# Patient Record
Sex: Male | Born: 1950 | Race: White | Hispanic: No | Marital: Married | Smoking: Former smoker
Health system: Southern US, Community
[De-identification: ages and names within clinical notes are randomized; demographics above are authoritative.]

## PROBLEM LIST (undated history)

## (undated) DIAGNOSIS — I10 Essential (primary) hypertension: Secondary | ICD-10-CM

## (undated) DIAGNOSIS — N189 Chronic kidney disease, unspecified: Secondary | ICD-10-CM

## (undated) DIAGNOSIS — E119 Type 2 diabetes mellitus without complications: Secondary | ICD-10-CM

## (undated) DIAGNOSIS — B029 Zoster without complications: Secondary | ICD-10-CM

## (undated) DIAGNOSIS — G629 Polyneuropathy, unspecified: Secondary | ICD-10-CM

## (undated) DIAGNOSIS — K219 Gastro-esophageal reflux disease without esophagitis: Secondary | ICD-10-CM

## (undated) HISTORY — PX: APPENDECTOMY: SHX54

## (undated) HISTORY — DX: Essential (primary) hypertension: I10

---

## 1976-04-04 HISTORY — PX: TOE AMPUTATION: SHX809

## 2006-05-14 ENCOUNTER — Emergency Department: Payer: Self-pay | Admitting: Emergency Medicine

## 2006-08-19 ENCOUNTER — Emergency Department: Payer: Self-pay | Admitting: Unknown Physician Specialty

## 2006-10-31 ENCOUNTER — Emergency Department: Payer: Self-pay | Admitting: Emergency Medicine

## 2007-02-16 ENCOUNTER — Other Ambulatory Visit: Payer: Self-pay

## 2007-02-16 ENCOUNTER — Inpatient Hospital Stay: Payer: Self-pay | Admitting: Internal Medicine

## 2007-02-17 ENCOUNTER — Other Ambulatory Visit: Payer: Self-pay

## 2007-02-18 ENCOUNTER — Other Ambulatory Visit: Payer: Self-pay

## 2007-07-14 ENCOUNTER — Other Ambulatory Visit: Payer: Self-pay

## 2007-07-14 ENCOUNTER — Emergency Department: Payer: Self-pay | Admitting: Emergency Medicine

## 2008-02-26 ENCOUNTER — Emergency Department: Payer: Self-pay | Admitting: Emergency Medicine

## 2009-01-16 ENCOUNTER — Emergency Department: Payer: Self-pay | Admitting: Emergency Medicine

## 2009-04-17 ENCOUNTER — Emergency Department: Payer: Self-pay | Admitting: Emergency Medicine

## 2009-04-21 ENCOUNTER — Emergency Department: Payer: Self-pay | Admitting: Internal Medicine

## 2009-05-12 ENCOUNTER — Ambulatory Visit: Payer: Self-pay | Admitting: Family Medicine

## 2010-02-23 ENCOUNTER — Emergency Department: Payer: Self-pay | Admitting: Emergency Medicine

## 2010-04-04 DIAGNOSIS — B029 Zoster without complications: Secondary | ICD-10-CM

## 2010-04-04 HISTORY — DX: Zoster without complications: B02.9

## 2011-03-17 ENCOUNTER — Other Ambulatory Visit (HOSPITAL_COMMUNITY): Payer: Self-pay | Admitting: Orthopedic Surgery

## 2011-03-18 ENCOUNTER — Encounter (HOSPITAL_COMMUNITY): Payer: Self-pay | Admitting: Pharmacy Technician

## 2011-03-22 ENCOUNTER — Other Ambulatory Visit: Payer: Self-pay

## 2011-03-22 ENCOUNTER — Encounter (HOSPITAL_COMMUNITY): Payer: Self-pay

## 2011-03-22 ENCOUNTER — Encounter (HOSPITAL_COMMUNITY)
Admission: RE | Admit: 2011-03-22 | Discharge: 2011-03-22 | Disposition: A | Discharge status: Home / Self Care | Payer: BC Managed Care – PPO | Source: Ambulatory Visit | Attending: Orthopedic Surgery | Admitting: Orthopedic Surgery

## 2011-03-22 HISTORY — DX: Zoster without complications: B02.9

## 2011-03-22 HISTORY — DX: Gastro-esophageal reflux disease without esophagitis: K21.9

## 2011-03-22 HISTORY — DX: Chronic kidney disease, unspecified: N18.9

## 2011-03-22 LAB — CBC
MCH: 32 pg (ref 26.0–34.0)
MCV: 88.6 fL (ref 78.0–100.0)
Platelets: 150 10*3/uL (ref 150–400)
RDW: 12.4 % (ref 11.5–15.5)
WBC: 4.8 10*3/uL (ref 4.0–10.5)

## 2011-03-22 LAB — COMPREHENSIVE METABOLIC PANEL
ALT: 59 U/L — ABNORMAL HIGH (ref 0–53)
AST: 33 U/L (ref 0–37)
Albumin: 3.6 g/dL (ref 3.5–5.2)
Calcium: 9.1 mg/dL (ref 8.4–10.5)
Chloride: 105 mEq/L (ref 96–112)
Creatinine, Ser: 0.91 mg/dL (ref 0.50–1.35)
Sodium: 140 mEq/L (ref 135–145)

## 2011-03-22 LAB — TYPE AND SCREEN
ABO/RH(D): O POS
Antibody Screen: NEGATIVE

## 2011-03-22 LAB — PROTIME-INR: INR: 0.9 (ref 0.00–1.49)

## 2011-03-22 LAB — SURGICAL PCR SCREEN: MRSA, PCR: NEGATIVE

## 2011-03-22 NOTE — Pre-Procedure Instructions (Signed)
20 Jared Castillo  03/22/2011   Your procedure is scheduled on:  03/30/11  Report to [facility] at 630 AM.  Call this number if you have problems the morning of surgery: [phone]   Remember:   Do not eat food:After Midnight.  May have clear liquids: up to 4 Hours before arrival.  Clear liquids include soda, tea, black coffee, apple or grape juice, broth.  Take these medicines the morning of surgery with A SIP OF WATER: metoprolol, prilosec, pain med   Do not wear jewelry, make-up or nail polish.  Do not wear lotions, powders, or perfumes. You may wear deodorant.  Do not shave 48 hours prior to surgery.  Do not bring valuables to the hospital.  Contacts, dentures or bridgework may not be worn into surgery.  Leave suitcase in the car. After surgery it may be brought to your room.  For patients admitted to the hospital, checkout time is 11:00 AM the day of discharge.   Patients discharged the day of surgery will not be allowed to drive home.  Name and phone number of your driver: [name] [phone]  Special Instructions: CHG Shower Use Special Wash: 1/2 bottle night before surgery and 1/2 bottle morning of surgery.   Please read over the following fact sheets that you were given: Pain Booklet, Coughing and Deep Breathing, Blood Transfusion Information, MRSA Information and Surgical Site Infection Prevention

## 2011-03-22 NOTE — Progress Notes (Signed)
Patient dx 03/21/11 with shingles -rash on left side. Dr duda office notified. Spoke with autumn.

## 2011-03-24 ENCOUNTER — Other Ambulatory Visit (HOSPITAL_COMMUNITY): Payer: BC Managed Care – PPO

## 2011-03-29 MED ORDER — CEFAZOLIN SODIUM-DEXTROSE 2-3 GM-% IV SOLR
2.0000 g | INTRAVENOUS | Status: AC
  Administered 2011-03-30: 2 g via INTRAVENOUS
  Filled 2011-03-29: qty 50

## 2011-03-30 ENCOUNTER — Ambulatory Visit (HOSPITAL_COMMUNITY): Payer: BC Managed Care – PPO

## 2011-03-30 ENCOUNTER — Encounter (HOSPITAL_COMMUNITY): Payer: Self-pay | Admitting: *Deleted

## 2011-03-30 ENCOUNTER — Encounter (HOSPITAL_COMMUNITY)
Admission: RE | Disposition: A | Discharge status: Home Health | Payer: Self-pay | Source: Ambulatory Visit | Attending: Orthopedic Surgery

## 2011-03-30 ENCOUNTER — Encounter (HOSPITAL_COMMUNITY): Payer: Self-pay | Admitting: Anesthesiology

## 2011-03-30 ENCOUNTER — Ambulatory Visit (HOSPITAL_COMMUNITY): Payer: BC Managed Care – PPO | Admitting: Anesthesiology

## 2011-03-30 ENCOUNTER — Inpatient Hospital Stay (HOSPITAL_COMMUNITY)
Admission: RE | Admit: 2011-03-30 | Discharge: 2011-04-02 | DRG: 209 | Disposition: A | Discharge status: Home Health | Payer: BC Managed Care – PPO | Source: Ambulatory Visit | Attending: Orthopedic Surgery | Admitting: Orthopedic Surgery

## 2011-03-30 DIAGNOSIS — I10 Essential (primary) hypertension: Secondary | ICD-10-CM | POA: Diagnosis present

## 2011-03-30 DIAGNOSIS — M1710 Unilateral primary osteoarthritis, unspecified knee: Principal | ICD-10-CM | POA: Diagnosis present

## 2011-03-30 DIAGNOSIS — M1711 Unilateral primary osteoarthritis, right knee: Secondary | ICD-10-CM

## 2011-03-30 DIAGNOSIS — Z6831 Body mass index (BMI) 31.0-31.9, adult: Secondary | ICD-10-CM

## 2011-03-30 DIAGNOSIS — E669 Obesity, unspecified: Secondary | ICD-10-CM | POA: Diagnosis present

## 2011-03-30 DIAGNOSIS — K219 Gastro-esophageal reflux disease without esophagitis: Secondary | ICD-10-CM | POA: Diagnosis present

## 2011-03-30 HISTORY — PX: TOTAL KNEE ARTHROPLASTY: SHX125

## 2011-03-30 SURGERY — ARTHROPLASTY, KNEE, TOTAL
Anesthesia: General | Site: Knee | Laterality: Right | Wound class: Clean

## 2011-03-30 MED ORDER — MEPERIDINE HCL 25 MG/ML IJ SOLN: 6.2500 mg | INTRAMUSCULAR | Status: DC | PRN

## 2011-03-30 MED ORDER — CEFAZOLIN SODIUM 1-5 GM-% IV SOLN
1.0000 g | Freq: Four times a day (QID) | INTRAVENOUS | Status: AC
  Administered 2011-03-30 – 2011-03-31 (×3): 1 g via INTRAVENOUS
  Filled 2011-03-30 (×3): qty 50

## 2011-03-30 MED ORDER — METHOCARBAMOL 500 MG PO TABS
500.0000 mg | ORAL_TABLET | Freq: Four times a day (QID) | ORAL | Status: DC | PRN
  Administered 2011-03-30 – 2011-03-31 (×5): 500 mg via ORAL
  Filled 2011-03-30 (×4): qty 1

## 2011-03-30 MED ORDER — ONDANSETRON HCL 4 MG PO TABS: 4.0000 mg | ORAL_TABLET | Freq: Four times a day (QID) | ORAL | Status: DC | PRN

## 2011-03-30 MED ORDER — DEXTROSE 5 % IV SOLN: 500.0000 mg | Freq: Four times a day (QID) | INTRAVENOUS | Status: DC | PRN

## 2011-03-30 MED ORDER — METOCLOPRAMIDE HCL 5 MG/ML IJ SOLN
5.0000 mg | Freq: Three times a day (TID) | INTRAMUSCULAR | Status: DC | PRN
  Filled 2011-03-30: qty 2

## 2011-03-30 MED ORDER — WARFARIN SODIUM 7.5 MG PO TABS
7.5000 mg | ORAL_TABLET | Freq: Once | ORAL | Status: AC
  Administered 2011-03-30: 7.5 mg via ORAL
  Filled 2011-03-30: qty 1

## 2011-03-30 MED ORDER — HYDROCODONE-ACETAMINOPHEN 5-325 MG PO TABS
1.0000 | ORAL_TABLET | ORAL | Status: DC | PRN
  Administered 2011-03-30 – 2011-04-02 (×7): 2 via ORAL
  Filled 2011-03-30 (×6): qty 2

## 2011-03-30 MED ORDER — WARFARIN SODIUM 1 MG PO TABS: 1.0000 mg | ORAL_TABLET | Freq: Every day | ORAL | Status: DC

## 2011-03-30 MED ORDER — PHENOL 1.4 % MT LIQD
1.0000 | OROMUCOSAL | Status: DC | PRN
  Filled 2011-03-30: qty 177

## 2011-03-30 MED ORDER — FLEET ENEMA 7-19 GM/118ML RE ENEM: 1.0000 | ENEMA | Freq: Once | RECTAL | Status: AC | PRN

## 2011-03-30 MED ORDER — DOCUSATE SODIUM 100 MG PO CAPS
100.0000 mg | ORAL_CAPSULE | Freq: Two times a day (BID) | ORAL | Status: DC
  Administered 2011-03-30 – 2011-04-02 (×7): 100 mg via ORAL
  Filled 2011-03-30 (×7): qty 1

## 2011-03-30 MED ORDER — METOCLOPRAMIDE HCL 10 MG PO TABS: 5.0000 mg | ORAL_TABLET | Freq: Three times a day (TID) | ORAL | Status: DC | PRN

## 2011-03-30 MED ORDER — ACETAMINOPHEN 650 MG RE SUPP: 650.0000 mg | Freq: Four times a day (QID) | RECTAL | Status: DC | PRN

## 2011-03-30 MED ORDER — OXYCODONE-ACETAMINOPHEN 5-325 MG PO TABS
1.0000 | ORAL_TABLET | ORAL | Status: DC | PRN
  Administered 2011-03-31 – 2011-04-02 (×8): 2 via ORAL
  Filled 2011-03-30 (×8): qty 2

## 2011-03-30 MED ORDER — MIDAZOLAM HCL 5 MG/5ML IJ SOLN
INTRAMUSCULAR | Status: DC | PRN
  Administered 2011-03-30: 2 mg via INTRAVENOUS

## 2011-03-30 MED ORDER — PROMETHAZINE HCL 25 MG/ML IJ SOLN: 6.2500 mg | INTRAMUSCULAR | Status: DC | PRN

## 2011-03-30 MED ORDER — ACETAMINOPHEN 10 MG/ML IV SOLN
INTRAVENOUS | Status: DC | PRN
  Administered 2011-03-30: 1000 mg via INTRAVENOUS

## 2011-03-30 MED ORDER — ZOLPIDEM TARTRATE 10 MG PO TABS: 10.0000 mg | ORAL_TABLET | Freq: Every evening | ORAL | Status: DC | PRN

## 2011-03-30 MED ORDER — BUPIVACAINE-EPINEPHRINE PF 0.5-1:200000 % IJ SOLN
INTRAMUSCULAR | Status: DC | PRN
  Administered 2011-03-30: 30 mL

## 2011-03-30 MED ORDER — PANTOPRAZOLE SODIUM 40 MG PO TBEC
40.0000 mg | DELAYED_RELEASE_TABLET | Freq: Every day | ORAL | Status: DC
  Administered 2011-03-30 – 2011-04-01 (×3): 40 mg via ORAL
  Filled 2011-03-30 (×3): qty 1

## 2011-03-30 MED ORDER — ACETAMINOPHEN 325 MG PO TABS: 650.0000 mg | ORAL_TABLET | Freq: Four times a day (QID) | ORAL | Status: DC | PRN

## 2011-03-30 MED ORDER — FERROUS SULFATE 325 (65 FE) MG PO TABS
325.0000 mg | ORAL_TABLET | Freq: Three times a day (TID) | ORAL | Status: DC
  Administered 2011-03-30 – 2011-04-02 (×9): 325 mg via ORAL
  Filled 2011-03-30 (×12): qty 1

## 2011-03-30 MED ORDER — DIPHENHYDRAMINE HCL 12.5 MG/5ML PO ELIX
12.5000 mg | ORAL_SOLUTION | ORAL | Status: DC | PRN
  Filled 2011-03-30: qty 10

## 2011-03-30 MED ORDER — PROPOFOL 10 MG/ML IV EMUL
INTRAVENOUS | Status: DC | PRN
  Administered 2011-03-30: 200 mg via INTRAVENOUS

## 2011-03-30 MED ORDER — LACTATED RINGERS IV SOLN
INTRAVENOUS | Status: DC | PRN
  Administered 2011-03-30 (×2): via INTRAVENOUS

## 2011-03-30 MED ORDER — OXYCODONE-ACETAMINOPHEN 5-325 MG PO TABS: 1.0000 | ORAL_TABLET | ORAL | Status: AC | PRN

## 2011-03-30 MED ORDER — HYDROMORPHONE HCL PF 1 MG/ML IJ SOLN
0.2500 mg | INTRAMUSCULAR | Status: DC | PRN
  Administered 2011-03-30 (×2): 0.5 mg via INTRAVENOUS

## 2011-03-30 MED ORDER — METOPROLOL TARTRATE 25 MG PO TABS
25.0000 mg | ORAL_TABLET | Freq: Two times a day (BID) | ORAL | Status: DC
  Administered 2011-03-30 – 2011-04-02 (×6): 25 mg via ORAL
  Filled 2011-03-30 (×8): qty 1

## 2011-03-30 MED ORDER — ONDANSETRON HCL 4 MG/2ML IJ SOLN
INTRAMUSCULAR | Status: DC | PRN
  Administered 2011-03-30: 4 mg via INTRAVENOUS

## 2011-03-30 MED ORDER — COUMADIN BOOK
Freq: Once | Status: DC
  Filled 2011-03-30: qty 1

## 2011-03-30 MED ORDER — ONDANSETRON HCL 4 MG/2ML IJ SOLN: 4.0000 mg | Freq: Four times a day (QID) | INTRAMUSCULAR | Status: DC | PRN

## 2011-03-30 MED ORDER — NEOSTIGMINE METHYLSULFATE 1 MG/ML IJ SOLN
INTRAMUSCULAR | Status: DC | PRN
  Administered 2011-03-30: 4 mg via INTRAVENOUS

## 2011-03-30 MED ORDER — SODIUM CHLORIDE 0.9 % IR SOLN
Status: DC | PRN
  Administered 2011-03-30: 3000 mL

## 2011-03-30 MED ORDER — FENTANYL CITRATE 0.05 MG/ML IJ SOLN
INTRAMUSCULAR | Status: DC | PRN
  Administered 2011-03-30 (×2): 100 ug via INTRAVENOUS
  Administered 2011-03-30 (×2): 50 ug via INTRAVENOUS

## 2011-03-30 MED ORDER — HYDROCODONE-ACETAMINOPHEN 5-500 MG PO TABS: 1.0000 | ORAL_TABLET | Freq: Four times a day (QID) | ORAL | Status: AC | PRN

## 2011-03-30 MED ORDER — BISACODYL 5 MG PO TBEC: 5.0000 mg | DELAYED_RELEASE_TABLET | Freq: Every day | ORAL | Status: DC | PRN

## 2011-03-30 MED ORDER — SODIUM CHLORIDE 0.9 % IV SOLN
INTRAVENOUS | Status: DC
  Administered 2011-03-30: 1000 mL via INTRAVENOUS

## 2011-03-30 MED ORDER — HYDROMORPHONE HCL PF 1 MG/ML IJ SOLN
0.5000 mg | INTRAMUSCULAR | Status: DC | PRN
  Administered 2011-03-30 – 2011-04-01 (×10): 1 mg via INTRAVENOUS
  Filled 2011-03-30 (×11): qty 1

## 2011-03-30 MED ORDER — GLYCOPYRROLATE 0.2 MG/ML IJ SOLN
INTRAMUSCULAR | Status: DC | PRN
  Administered 2011-03-30: .8 mg via INTRAVENOUS

## 2011-03-30 MED ORDER — ROCURONIUM BROMIDE 100 MG/10ML IV SOLN
INTRAVENOUS | Status: DC | PRN
  Administered 2011-03-30: 50 mg via INTRAVENOUS

## 2011-03-30 MED ORDER — LIDOCAINE HCL (CARDIAC) 20 MG/ML IV SOLN
INTRAVENOUS | Status: DC | PRN
  Administered 2011-03-30: 50 mg via INTRAVENOUS

## 2011-03-30 MED ORDER — WARFARIN VIDEO: Freq: Once | Status: DC

## 2011-03-30 MED ORDER — MENTHOL 3 MG MT LOZG: 1.0000 | LOZENGE | OROMUCOSAL | Status: DC | PRN

## 2011-03-30 SURGICAL SUPPLY — 52 items
BANDAGE COBAN STERILE 6 (GAUZE/BANDAGES/DRESSINGS) ×2 IMPLANT
BLADE KNIFE  20 PERSONNA (BLADE) ×2
BLADE KNIFE 20 PERSONNA (BLADE) ×2 IMPLANT
BLADE SAW SAG 90X13X1.27 (BLADE) ×2 IMPLANT
BLADE SAW SGTL 13.0X1.19X90.0M (BLADE) ×2 IMPLANT
BNDG COHESIVE 6X5 TAN STRL LF (GAUZE/BANDAGES/DRESSINGS) ×4 IMPLANT
BONE CEMENT PALACOSE (Orthopedic Implant) ×2 IMPLANT
BOWL SMART MIX CTS (DISPOSABLE) IMPLANT
CEMENT BONE PALACOSE (Orthopedic Implant) ×1 IMPLANT
CLOTH BEACON ORANGE TIMEOUT ST (SAFETY) ×2 IMPLANT
COVER BACK TABLE 24X17X13 BIG (DRAPES) IMPLANT
COVER SURGICAL LIGHT HANDLE (MISCELLANEOUS) ×2 IMPLANT
CUFF TOURNIQUET SINGLE 34IN LL (TOURNIQUET CUFF) IMPLANT
CUFF TOURNIQUET SINGLE 44IN (TOURNIQUET CUFF) IMPLANT
DRAPE EXTREMITY T 121X128X90 (DRAPE) ×2 IMPLANT
DRAPE PROXIMA HALF (DRAPES) ×2 IMPLANT
DRAPE U-SHAPE 47X51 STRL (DRAPES) ×2 IMPLANT
DRSG ADAPTIC 3X8 NADH LF (GAUZE/BANDAGES/DRESSINGS) ×2 IMPLANT
DRSG PAD ABDOMINAL 8X10 ST (GAUZE/BANDAGES/DRESSINGS) ×2 IMPLANT
DURAPREP 26ML APPLICATOR (WOUND CARE) ×2 IMPLANT
ELECT REM PT RETURN 9FT ADLT (ELECTROSURGICAL) ×2
ELECTRODE REM PT RTRN 9FT ADLT (ELECTROSURGICAL) ×1 IMPLANT
FACESHIELD LNG OPTICON STERILE (SAFETY) ×2 IMPLANT
GLOVE BIOGEL PI IND STRL 9 (GLOVE) ×1 IMPLANT
GLOVE BIOGEL PI INDICATOR 9 (GLOVE) ×1
GLOVE SURG ORTHO 9.0 STRL STRW (GLOVE) ×2 IMPLANT
GOWN PREVENTION PLUS XLARGE (GOWN DISPOSABLE) ×2 IMPLANT
GOWN SRG XL XLNG 56XLVL 4 (GOWN DISPOSABLE) ×2 IMPLANT
GOWN STRL NON-REIN XL XLG LVL4 (GOWN DISPOSABLE) ×2
HANDPIECE INTERPULSE COAX TIP (DISPOSABLE)
KIT BASIN OR (CUSTOM PROCEDURE TRAY) ×2 IMPLANT
KIT ROOM TURNOVER OR (KITS) ×2 IMPLANT
MANIFOLD NEPTUNE II (INSTRUMENTS) ×2 IMPLANT
NEEDLE SPNL 18GX3.5 QUINCKE PK (NEEDLE) ×2 IMPLANT
NS IRRIG 1000ML POUR BTL (IV SOLUTION) ×2 IMPLANT
PACK TOTAL JOINT (CUSTOM PROCEDURE TRAY) ×2 IMPLANT
PAD ARMBOARD 7.5X6 YLW CONV (MISCELLANEOUS) ×4 IMPLANT
PADDING CAST COTTON 6X4 STRL (CAST SUPPLIES) ×2 IMPLANT
SET HNDPC FAN SPRY TIP SCT (DISPOSABLE) IMPLANT
SPONGE GAUZE 4X4 12PLY (GAUZE/BANDAGES/DRESSINGS) ×2 IMPLANT
STAPLER VISISTAT 35W (STAPLE) ×2 IMPLANT
SUCTION FRAZIER TIP 10 FR DISP (SUCTIONS) IMPLANT
SUT VIC AB 0 CTB1 27 (SUTURE) IMPLANT
SUT VIC AB 1 CTX 36 (SUTURE)
SUT VIC AB 1 CTX36XBRD ANBCTR (SUTURE) IMPLANT
SUT VIC AB 2-0 CTB1 (SUTURE) IMPLANT
SYR 50ML SLIP (SYRINGE) ×2 IMPLANT
TOWEL OR 17X24 6PK STRL BLUE (TOWEL DISPOSABLE) ×2 IMPLANT
TOWEL OR 17X26 10 PK STRL BLUE (TOWEL DISPOSABLE) ×2 IMPLANT
TRAY FOLEY CATH 14FR (SET/KITS/TRAYS/PACK) IMPLANT
WATER STERILE IRR 1000ML POUR (IV SOLUTION) ×6 IMPLANT
WRAP KNEE MAXI GEL POST OP (GAUZE/BANDAGES/DRESSINGS) ×2 IMPLANT

## 2011-03-30 NOTE — H&P (Signed)
Jared Castillo is an 60 y.o. male.   Chief Complaint: Right knee pain HPI: Patient is a 60 year old gentleman who has failed prolonged conservative treatment for osteoarthritis of his right knee. Patient has undergone conservative measures including injections anti-inflammatories activity modification assistive devices all without relief of his right knee pain. Complains of pain with activities of daily living.  Past Medical History  Diagnosis Date  . Shingles rash 13    dx yesterday 03/21/11  . Chronic kidney disease     stones  . GERD (gastroesophageal reflux disease)     Past Surgical History  Procedure Date  . Appendectomy     child  . 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 Social History:  reports that he has quit smoking. He does not have any smokeless tobacco history on file. He reports that he does not use illicit drugs. His alcohol history not on file.  Allergies: No Known Allergies  Medications Prior to Admission  Medication Dose Route Frequency Provider Last Rate Last Dose  . ceFAZolin (ANCEF) IVPB 2 g/50 mL premix  2 g Intravenous 60 min Pre-Op Christella Hartigan, PHARMD       No current outpatient prescriptions on file as of .    No results found for this or any previous visit (from the past 48 hour(s)). No results found.  Review of Systems  All other systems reviewed and are negative.    There were no vitals taken for this visit. Physical Exam on examination patient has range of motion from 10-100. There is crepitation with range of motion of the right knee. Collateral ligaments are stable.  Assessment/Plan Osteoarthritis right knee. Plan for total knee arthroplasty. Risks and benefits were discussed including infection neurovascular injury persistent pain DVT pulmonary embolus need for additional surgery. Patient states he understands and wished to proceed at this time.  Laneice Meneely V 03/30/2011, 6:03 AM

## 2011-03-30 NOTE — Anesthesia Postprocedure Evaluation (Signed)
  Anesthesia Post-op Note  Patient: Jared Castillo  Procedure(s) Performed:  TOTAL KNEE ARTHROPLASTY - Right Total Knee Arthroplastly  Patient Location: PACU  Anesthesia Type: GA combined with regional for post-op pain  Level of Consciousness: awake  Airway and Oxygen Therapy: Patient Spontanous Breathing  Post-op Pain: mild  Post-op Assessment: Post-op Vital signs reviewed  Post-op Vital Signs: stable  Complications: No apparent anesthesia complications

## 2011-03-30 NOTE — Anesthesia Preprocedure Evaluation (Addendum)
Anesthesia Evaluation  Patient identified by MRN, date of birth, ID band Patient awake    Reviewed: Allergy & Precautions, H&P , NPO status , Patient's Chart, lab work & pertinent test results, reviewed documented beta blocker date and time   Airway Mallampati: II  Neck ROM: Full    Dental  (+) Edentulous Upper and Edentulous Lower   Pulmonary  clear to auscultation        Cardiovascular hypertension, Pt. on medications and Pt. on home beta blockers Regular Normal    Neuro/Psych    GI/Hepatic GERD-  Medicated and Controlled,  Endo/Other    Renal/GU Renal disease     Musculoskeletal  (+) Arthritis -,   Abdominal (+) obese,   Peds  Hematology   Anesthesia Other Findings   Reproductive/Obstetrics                          Anesthesia Physical Anesthesia Plan  ASA: II  Anesthesia Plan: General   Post-op Pain Management:    Induction: Intravenous  Airway Management Planned: Oral ETT  Additional Equipment:   Intra-op Plan:   Post-operative Plan: Extubation in OR  Informed Consent: I have reviewed the patients History and Physical, chart, labs and discussed the procedure including the risks, benefits and alternatives for the proposed anesthesia with the patient or authorized representative who has indicated his/her understanding and acceptance.   Dental advisory given  Plan Discussed with: CRNA and Surgeon  Anesthesia Plan Comments:         Anesthesia Quick Evaluation

## 2011-03-30 NOTE — Transfer of Care (Signed)
Immediate Anesthesia Transfer of Care Note  Patient: Jared Castillo  Procedure(s) Performed:  TOTAL KNEE ARTHROPLASTY - Right Total Knee Arthroplastly  Patient Location: PACU  Anesthesia Type: GA combined with regional for post-op pain  Level of Consciousness: awake  Airway & Oxygen Therapy: Patient connected to nasal cannula oxygen  Post-op Assessment: Report given to PACU RN and Post -op Vital signs reviewed and stable  Post vital signs: Reviewed and stable  Complications: No apparent anesthesia complications

## 2011-03-30 NOTE — Anesthesia Procedure Notes (Addendum)
Anesthesia Regional Block:  Femoral nerve block  Pre-Anesthetic Checklist: ,, timeout performed, Correct Patient, Correct Site, Correct Laterality, Correct Procedure, Correct Position, site marked, Risks and benefits discussed, at surgeon's request and post-op pain management  Laterality: Right  Prep: Betadine       Needles:  Injection technique: Single-shot  Needle Type: Stimulator Needle - 80     Needle Length: 5cm 5 cm Needle Gauge: 22 and 22 G  Needle insertion depth: 4 cm   Additional Needles:  Procedures: nerve stimulator Femoral nerve block  Nerve Stimulator or Paresthesia:  Response: Twitch elicited, 0.8 mA, 0.3 ms,   Additional Responses:   Narrative:  Start time: 03/30/2011 7:50 AM End time: 03/30/2011 8:11 AM  Performed by: Personally  Anesthesiologist: Alma Friendly, MD  Additional Notes: BP cuff, EKG monitors applied. Sedation begun. Femoral artery palpated for location of nerve. After nerve location anesthetic injected incrementally, slowly , and after neg aspirations. Tolerated well.  Femoral nerve block Procedure Name: Intubation Date/Time: 03/30/2011 8:33 AM Performed by: Margaree Mackintosh Pre-anesthesia Checklist: Patient identified, Emergency Drugs available, Suction available, Patient being monitored and Timeout performed Patient Re-evaluated:Patient Re-evaluated prior to inductionOxygen Delivery Method: Circle System Utilized Preoxygenation: Pre-oxygenation with 100% oxygen Intubation Type: IV induction Ventilation: Mask ventilation with difficulty and Oral airway inserted - appropriate to patient size    Date/Time: 03/30/2011 8:40 AM Performed by: Margaree Mackintosh Laryngoscope Size: Mac and 3 Grade View: Grade III Tube type: Oral Tube size: 8.0 mm Number of attempts: 1 Airway Equipment and Method: stylet Placement Confirmation: ETT inserted through vocal cords under direct vision,  positive ETCO2 and breath sounds checked- equal and  bilateral Secured at: 22 cm Tube secured with: Tape Dental Injury: Teeth and Oropharynx as per pre-operative assessment  Difficulty Due To: Difficulty was unanticipated and Difficult Airway- due to anterior larynx

## 2011-03-30 NOTE — Op Note (Signed)
OPERATIVE REPORT  DATE OF SURGERY: 03/30/2011  PATIENT:  Alvie Heidelberg,  60 y.o. male  PRE-OPERATIVE DIAGNOSIS:  Right Knee Osteoarthritis  POST-OPERATIVE DIAGNOSIS:  Right Knee Osteoarthritis  PROCEDURE:  Procedure(s): TOTAL KNEE ARTHROPLASTY  SURGEON:  Surgeon(s): Nadara Mustard, MD  ANESTHESIA:   general  EBL:  Minimal ML  SPECIMEN:  No Specimen  TOURNIQUET:   Total Tourniquet Time Documented: Thigh (Right) - 42 minutes  PROCEDURE DETAILS: Right total knee arthroplasty. Zimmer components. Size G. femur. Size 6 tibia. 12 mm poly-tray. 32 mm patella. Indication for procedure. Patient is a 60 year old gentleman with osteoarthritis of his right knee he has failed conservative treatment risks and benefits were discussed and patient was to proceed at this time. Description of procedure patient was brought to OR room 10 and underwent a general anesthetic. After adequate levels of anesthesia were obtained patient's right lower extremity was prepped using DuraPrep and draped into a sterile field with Ioban covering all exposed skin. A midline incision was made carried down to the medial parapatellar retinacular incision 10 mm reamer was used for the IM guide this was set for 5 valgus with 10 mm taken off the distal femur. Attention was then focused to the tibia neutral varus and valgus and 7 posterior slope was used to resect 10 mm off the tibia. The tibia was sized for a size 6 and the punch and keel cut was made for the size 6 tibia. Attention was then focused on the femur the chamfer cuts and box cuts were made for a size G. Trial components were placed the knee was placed through a range of motion had stable varus and valgus stress with a 12 mm poly-tray. The patella was resurfaced 10 mm was taken off the patella and a 32 mm patella punch cuts were made. The knee was then irrigated with pulsatile lavage the components were cemented in place with the loose cement removed. The knee was  left in extension until the cement hardened the knee was left with the patella clamped until the cement hardened. The patella tracked midline the tourniquet tourniquet was deflated and hemostasis was obtained. The retinacular incision was closed using #1 Vicryl. Subcutaneous is closed using 0 Vicryl. The skin was closed using approximate staples. The wound was covered with Adaptic orthopedic sponges ABDs dressing Kerlix and Coban. Patient was extubated taken to the PACU in stable condition.  PLAN OF CARE: Admit to inpatient   PATIENT DISPOSITION:  PACU - hemodynamically stable.   Nadara Mustard, MD 03/30/2011 10:09 AM

## 2011-03-30 NOTE — Progress Notes (Signed)
ANTICOAGULATION CONSULT NOTE - Initial Consult  Pharmacy Consult for Couamdin Indication: VTE prophylaxis  No Known Allergies  Patient Measurements:     Vital Signs: Temp: 97.8 F (36.6 C) (12/26 1117) Temp src: Oral (12/26 0640) BP: 133/71 mmHg (12/26 1117) Pulse Rate: 65  (12/26 1117)  Labs: Baseline INR 0.9; Hgb 15.4; PLTC 150; SCr 0.91 (03/22/11)  Medical History: Past Medical History  Diagnosis Date  . Shingles rash 13    dx yesterday 03/21/11  . Chronic kidney disease     stones  . GERD (gastroesophageal reflux disease)   . Hypertension     Medications:  Scheduled:    . ceFAZolin (ANCEF) IV  1 g Intravenous Q6H  . ceFAZolin (ANCEF) IV  2 g Intravenous 60 min Pre-Op  . docusate sodium  100 mg Oral BID  . ferrous sulfate  325 mg Oral TID PC  . metoprolol tartrate  25 mg Oral BID  . pantoprazole  40 mg Oral Q1200    Assessment: 60 yo M admitted for with osteoarthritis s/p R knee replacement.  To start Coumadin for VTE prophylaxis.  Goal of Therapy:  INR 2-3   Plan:  Coumadin 7.5 mg PO x 1 dose tonight. Daily INR. Coumadin education materials ordered.  [employer], Pharm.D., BCPS Clinical Pharmacist Pager [phone]  03/30/2011,12:22 PM

## 2011-03-30 NOTE — Preoperative (Signed)
Beta Blockers   Reason not to administer Beta Blockers:Not Applicable. Metoprolol @ 0500 03/30/11

## 2011-03-31 ENCOUNTER — Encounter (HOSPITAL_COMMUNITY): Payer: Self-pay | Admitting: Orthopedic Surgery

## 2011-03-31 LAB — BASIC METABOLIC PANEL
BUN: 13 mg/dL (ref 6–23)
Chloride: 99 mEq/L (ref 96–112)
Creatinine, Ser: 0.81 mg/dL (ref 0.50–1.35)
GFR calc Af Amer: >90 mL/min (ref >90)
GFR calc non Af Amer: >90 mL/min (ref >90)
Potassium: 3.9 mEq/L (ref 3.5–5.1)

## 2011-03-31 LAB — CBC
MCHC: 34.4 g/dL (ref 30.0–36.0)
MCV: 89.5 fL (ref 78.0–100.0)
Platelets: 161 10*3/uL (ref 150–400)
RDW: 12.9 % (ref 11.5–15.5)
WBC: 8.8 10*3/uL (ref 4.0–10.5)

## 2011-03-31 LAB — PROTIME-INR: INR: 1.1 (ref 0.00–1.49)

## 2011-03-31 MED ORDER — WARFARIN SODIUM 7.5 MG PO TABS
7.5000 mg | ORAL_TABLET | Freq: Once | ORAL | Status: AC
  Administered 2011-03-31: 7.5 mg via ORAL
  Filled 2011-03-31: qty 1

## 2011-03-31 NOTE — Progress Notes (Signed)
Error JB.

## 2011-03-31 NOTE — Progress Notes (Signed)
Subjective: 1 Day Post-Op Procedure(s) (LRB): TOTAL KNEE ARTHROPLASTY (Right)   Dressing dry  Objective: Vital signs in last 24 hours: Temp:  [97.3 F (36.3 C)-98.6 F (37 C)] 98.6 F (37 C) (12/26 2104) Pulse Rate:  [59-95] 95  (12/26 2104) Resp:  [12-20] 18  (12/26 2104) BP: (121-150)/(71-94) 124/75 mmHg (12/26 2104) SpO2:  [92 %-97 %] 92 % (12/26 2104)  Intake/Output from previous day: 12/26 0701 - 12/27 0700 In: 2146.3 [P.O.:240; I.V.:1906.3] Out: 700 [Urine:700] Intake/Output this shift: Total I/O In: 546.3 [P.O.:240; I.V.:306.3] Out: -    Basename 03/31/11 0540  HGB 10.9*    Basename 03/31/11 0540  WBC 8.8  RBC 3.54*  HCT 31.7*  PLT 161   No results found for this basename: NA:2,K:2,CL:2,CO2:2,BUN:2,CREATININE:2,GLUCOSE:2,CALCIUM:2 in the last 72 hours No results found for this basename: LABPT:2,INR:2 in the last 72 hours  Neurologically intact  Assessment/Plan: 1 Day Post-Op Procedure(s) (LRB): TOTAL KNEE ARTHROPLASTY (Right) Up with therapy  Jared Castillo V 03/31/2011, 6:28 AM

## 2011-03-31 NOTE — Progress Notes (Signed)
Physical Therapy Evaluation Patient Details Name: Jared Castillo MRN: [id number] DOB: 29-Sep-1950 Today's Date: 03/31/2011  Problem List: There is no problem list on file for this patient.   Past Medical History:  Past Medical History  Diagnosis Date  . Shingles rash 13    dx yesterday 03/21/11  . Chronic kidney disease     stones  . GERD (gastroesophageal reflux disease)   . Hypertension    Past Surgical History:  Past Surgical History  Procedure Date  . Appendectomy     child  . 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  pt very motivated and should make great progress.   PT Recommendation/Assessment: Patient will need skilled PT in the acute care venue PT Problem List: Decreased strength;Decreased range of motion;Decreased activity tolerance;Decreased balance;Decreased mobility;Decreased knowledge of use of DME;Pain Barriers to Discharge: None PT Therapy Diagnosis : Abnormality of gait;Acute pain PT Plan PT Frequency: 7X/week PT Treatment/Interventions: DME instruction;Gait training;Stair training;Functional mobility training;Therapeutic activities;Therapeutic exercise;Balance training;Patient/family education PT Recommendation Recommendations for Other Services: OT consult Follow Up Recommendations: Home health PT Equipment Recommended: Rolling walker with 5" wheels;3 in 1 bedside comode PT Goals  Acute Rehab PT Goals PT Goal Formulation: With patient Time For Goal Achievement: 7 days Pt will go Supine/Side to Sit: Independently PT Goal: Supine/Side to Sit - Progress: Not met Pt will go Sit to Supine/Side: Independently PT Goal: Sit to Supine/Side - Progress: Not met Pt will go Sit to Stand: with modified independence PT Goal: Sit to Stand - Progress: Not met Pt will Ambulate: >150 feet;with modified independence;with rolling walker PT Goal: Ambulate - Progress:  Not met Pt will Go Up / Down Stairs: 3-5 stairs;with min assist;with least restrictive assistive device PT Goal: Up/Down Stairs - Progress: Not met Pt will Perform Home Exercise Program: Independently PT Goal: Perform Home Exercise Program - Progress: Not met  PT Evaluation Precautions/Restrictions  Precautions Precautions: Knee Required Braces or Orthoses: No Restrictions Weight Bearing Restrictions: Yes RLE Weight Bearing: Weight bearing as tolerated Prior Functioning  Home Living Lives With: Spouse Receives Help From: Family Type of Home: Mobile home Home Layout: One level Home Access: Stairs to enter Entrance Stairs-Rails: Lawyer of Steps: 4 Home Adaptive Equipment: Crutches Prior Function Level of Independence: Independent with basic ADLs;Independent with homemaking with ambulation;Independent with gait;Independent with transfers Able to Take Stairs?: Reciprically Driving: Yes 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   Transfers Transfers: Yes Sit to Stand: 4: Min assist;With upper extremity assist;From bed Sit to Stand Details (indicate cue type and reason): cues for UE use and positioning of LEs.   Stand to Sit: 4: Min assist;With upper extremity assist;With armrests;To chair/3-in-1 Stand to Sit Details: cues for LE positioning and to conrtol  descent Ambulation/Gait Ambulation/Gait: Yes Ambulation/Gait Assistance: 4: Min assist Ambulation/Gait Assistance Details (indicate cue type and reason): cues for  sequencing, upright posture, WBing on R LE Ambulation Distance (Feet): 40 Feet Assistive device: Rolling walker Gait Pattern: Step-to pattern;Decreased stride length;Trunk flexed Stairs: No Wheelchair Mobility Wheelchair Mobility: No    Exercise    End of Session PT - End of Session Equipment Utilized During Treatment: Gait belt Activity Tolerance: Patient limited by pain;Patient limited by fatigue Patient left: in chair;with call bell in reach;with family/visitor present Nurse Communication: Mobility status for transfers;Mobility status for ambulation General Behavior During Session: [facility] for tasks performed Cognition: [facility] for tasks performed  Sunny Schlein, [place] [phone] 03/31/2011, 11:27 AM

## 2011-03-31 NOTE — Progress Notes (Signed)
Case Manager has spoken with patient, choice offered for home Health. Home Health and DME have been arranged thru [facility].

## 2011-03-31 NOTE — Progress Notes (Signed)
ANTICOAGULATION CONSULT NOTE - Follow Up Consult  Pharmacy Consult for Coumadin Indication: VTE prophylaxis s/p R TKA  No Known Allergies  Patient Measurements:    Vital Signs: Temp: 98.2 F (36.8 C) (12/27 0629) BP: 140/85 mmHg (12/27 0629) Pulse Rate: 89  (12/27 0629)  Labs:  Basename 03/31/11 0540  HGB 10.9*  HCT 31.7*  PLT 161  APTT --  LABPROT 14.4  INR 1.10  HEPARINUNFRC --  CREATININE 0.81  CKTOTAL --  CKMB --  TROPONINI --   CrCl is unknown because there is no height on file for the current visit.   Medications:  Scheduled:    . ceFAZolin (ANCEF) IV  1 g Intravenous Q6H  . coumadin book   Does not apply Once  . docusate sodium  100 mg Oral BID  . ferrous sulfate  325 mg Oral TID PC  . metoprolol tartrate  25 mg Oral BID  . pantoprazole  40 mg Oral Q1200  . warfarin  7.5 mg Oral ONCE-1800  . warfarin   Does not apply Once    Assessment: 60 yo M s/p R TKA on Coumadin for post-op VTE prophylaxis.  INR sub therapeutic as expected after first dose of Coumadin.  Goal of Therapy:  INR 2-3   Plan:  Repeat Coumadin 7.5 mg dose tonight. INR in AM.  Dixie Dials, Pharm.D., BCPS Clinical Pharmacist Pager [phone]  03/31/2011,10:54 AM

## 2011-03-31 NOTE — Progress Notes (Signed)
Occupational Therapy Evaluation Patient Details Name: Jared Castillo MRN: [id number] DOB: 06-10-1950 Today's Date: 03/31/2011  Problem List: There is no problem list on file for this patient.   Past Medical History:  Past Medical History  Diagnosis Date  . Shingles rash 13    dx yesterday 03/21/11  . Chronic kidney disease     stones  . GERD (gastroesophageal reflux disease)   . Hypertension    Past Surgical History:  Past Surgical History  Procedure Date  . Appendectomy     child  . 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 Total knee arthroplasty 03/30/2011    Procedure: TOTAL KNEE ARTHROPLASTY;  Surgeon: Nadara Mustard, MD;  Location: [facility] OR;  Service: Orthopedics;  Laterality: Right;  Right Total Knee Arthroplastly    OT Assessment/Plan/Recommendation OT Assessment Clinical Impression Statement: Pt s/p R TKA and demonstrates with decreased activity tolerance and acute pain.  Pt will benefit from acute OT to increase I with ADLs and functional transfers in prep for d/c home with wife. OT Recommendation/Assessment: Patient will need skilled OT in the acute care venue OT Problem List: Decreased activity tolerance;Pain;Decreased knowledge of use of DME or AE OT Therapy Diagnosis : Acute pain OT Plan OT Frequency: Min 2X/week OT Treatment/Interventions: Self-care/ADL training;DME and/or AE instruction;Therapeutic activities;Patient/family education OT Recommendation Follow Up Recommendations: None Equipment Recommended: Rolling walker with 5" wheels;3 in 1 bedside comode Individuals Consulted Consulted and Agree with Results and Recommendations: Patient OT Goals Acute Rehab OT Goals OT Goal Formulation: With patient Time For Goal Achievement: 7 days ADL Goals Pt Will Perform Grooming: with modified independence;Standing at sink;Unsupported ADL Goal: Grooming - Progress: Not met Pt Will Perform Lower Body Bathing: with modified independence;Sit to stand from bed;Sit to  stand from chair;Unsupported ADL Goal: Lower Body Bathing - Progress: Not met Pt Will Perform Lower Body Dressing: with modified independence;Sit to stand from chair;Sit to stand from bed;Unsupported ADL Goal: Lower Body Dressing - Progress: Not met Pt Will Transfer to Toilet: with modified independence;Ambulation;with DME;3-in-1 ADL Goal: Toilet Transfer - Progress: Not met Miscellaneous OT Goals Miscellaneous OT Goal #1: Pt will perform bed mobility with mod I HOB flat in prep for EOB ADLs. OT Goal: Miscellaneous Goal #1 - Progress: Not met  OT Evaluation Precautions/Restrictions  Precautions Precautions: Knee Required Braces or Orthoses: No Restrictions Weight Bearing Restrictions: Yes RLE Weight Bearing: Weight bearing as tolerated Prior Functioning Home Living Lives With: Spouse Receives Help From: Family Type of Home: Mobile home Home Layout: One level Home Access: Stairs to enter Entrance Stairs-Rails: Lawyer of Steps: 4 Bathroom Shower/Tub: Tub/shower unit;Walk-in shower Bathroom Toilet: Standard Home Adaptive Equipment: Crutches Prior Function Level of Independence: Independent with basic ADLs;Independent with homemaking with ambulation;Independent with gait;Independent with transfers Able to Take Stairs?: Reciprically Driving: Yes 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  ) ADL ADL Eating/Feeding: Performed;Independent Where Assessed - Eating/Feeding: Chair Grooming: Simulated;Set up Where Assessed - Grooming: Sitting, chair;Unsupported Upper Body Bathing: Simulated;Set up Where Assessed - Upper Body Bathing: Sitting, bed;Unsupported Lower Body Bathing: Simulated;Minimal assistance Where Assessed - Lower Body Bathing: Unsupported;Sit to stand from bed Upper Body Dressing: Set up;Performed Where Assessed - Upper Body Dressing: Sitting, bed;Unsupported Lower Body Dressing: Simulated;Minimal assistance Where Assessed -  Lower Body Dressing: Sit to stand from bed;Supported Toilet Transfer: Simulated;Minimal assistance Toilet Transfer Details (indicate cue type and reason): Pt ambulated through room from bed to chair Toilet Transfer Method: Ambulating Toileting - Clothing Manipulation:  Simulated;Minimal assistance Toileting - Clothing Manipulation Details (indicate cue type and reason): min A for steadying and balance Where Assessed - Toileting Clothing Manipulation: Standing Equipment Used: Rolling walker Vision/Perception    Cognition Cognition Arousal/Alertness: Awake/alert Overall Cognitive Status: Appears within functional limits for tasks assessed Orientation Level: Oriented X4 Sensation/Coordination   Extremity Assessment RUE Assessment RUE Assessment: Within Functional Limits LUE Assessment LUE Assessment: Within Functional Limits Mobility  Bed Mobility Bed Mobility: Yes Supine to Sit: 4: Min assist Supine to Sit Details (indicate cue type and reason): A with R LE only Sitting - Scoot to Edge of Bed: 4: Min assist Sitting - Scoot to [place] of Bed Details (indicate cue type and reason): A to support R LE only.   Transfers Transfers: Yes Sit to Stand: 4: Min assist;With upper extremity assist;From bed Sit to Stand Details (indicate cue type and reason): cues for UE use and positioning of LEs.   Stand to Sit: 4: Min assist;With upper extremity assist;With armrests;To chair/3-in-1 Stand to Sit Details: cues for LE positioning and to conrtol descent Exercises   End of Session OT - End of Session Equipment Utilized During Treatment: Gait belt Activity Tolerance: Patient limited by fatigue Patient left: in chair;with call bell in reach;with family/visitor present Nurse Communication: Mobility status for transfers General Behavior During Session: [facility] for tasks performed Cognition: [facility] for tasks performed   Cipriano Mile 03/31/2011, 11:55 AM  03/31/2011 Cipriano Mile OTR/L Pager [phone] Office [phone]

## 2011-04-01 DIAGNOSIS — M1711 Unilateral primary osteoarthritis, right knee: Secondary | ICD-10-CM | POA: Diagnosis present

## 2011-04-01 LAB — PROTIME-INR: Prothrombin Time: 18 seconds — ABNORMAL HIGH (ref 11.6–15.2)

## 2011-04-01 LAB — CBC
MCH: 31.1 pg (ref 26.0–34.0)
MCV: 88.8 fL (ref 78.0–100.0)
Platelets: 128 10*3/uL — ABNORMAL LOW (ref 150–400)
RBC: 3.12 MIL/uL — ABNORMAL LOW (ref 4.22–5.81)
RDW: 12.9 % (ref 11.5–15.5)

## 2011-04-01 MED ORDER — WARFARIN SODIUM 6 MG PO TABS
6.0000 mg | ORAL_TABLET | Freq: Once | ORAL | Status: AC
  Administered 2011-04-01: 6 mg via ORAL
  Filled 2011-04-01: qty 1

## 2011-04-01 MED ORDER — TAMSULOSIN HCL 0.4 MG PO CAPS
0.4000 mg | ORAL_CAPSULE | Freq: Every day | ORAL | Status: DC
  Administered 2011-04-01 – 2011-04-02 (×2): 0.4 mg via ORAL
  Filled 2011-04-01 (×2): qty 1

## 2011-04-01 NOTE — Progress Notes (Signed)
Utilization review completed. Latwan Luchsinger, RN, BSN. 04/01/11  

## 2011-04-01 NOTE — Progress Notes (Signed)
Subjective: 2 Days Post-Op Procedure(s) (LRB): TOTAL KNEE ARTHROPLASTY (Right) Patient reports pain as mild and well controlled with po meds.   Ambulated in hallway with PT and doing active ROM exercises Objective: Vital signs in last 24 hours: Temp:  [96.9 F (36.1 C)-98.9 F (37.2 C)] 98.6 F (37 C) (12/28 0623) Pulse Rate:  [88-96] 90  (12/28 0623) Resp:  [18] 18  (12/28 0623) BP: (137-153)/(74-93) 137/74 mmHg (12/28 0623) SpO2:  [93 %-97 %] 97 % (12/28 0623)  Intake/Output from previous day: 12/27 0701 - 12/28 0700 In: 360 [P.O.:360] Out: 1450 [Urine:1450] Intake/Output this shift:     Basename 04/01/11 0532 03/31/11 0540  HGB 9.7* 10.9*    Basename 04/01/11 0532 03/31/11 0540  WBC 8.5 8.8  RBC 3.12* 3.54*  HCT 27.7* 31.7*  PLT 128* 161    Basename 03/31/11 0540  NA 135  K 3.9  CL 99  CO2 27  BUN 13  CREATININE 0.81  GLUCOSE 146*  CALCIUM 8.2*    Basename 04/01/11 0532 03/31/11 0540  LABPT -- --  INR 1.46 1.10    Neurovascular intact Dorsiflexion/Plantar flexion intact Incision: no drainage  Assessment/Plan: 2 Days Post-Op Procedure(s) (LRB): TOTAL KNEE ARTHROPLASTY (Right) Up with therapy D/C IV fluids Plan for discharge tomorrow if continues to do well with PT Will dc foley around noon today for a voiding trial. Start flomax now  Jared Castillo M 04/01/2011, 10:30 AM

## 2011-04-01 NOTE — Progress Notes (Signed)
ANTICOAGULATION CONSULT NOTE - Follow Up Consult  Pharmacy Consult for Coumadin Indication: VTE prophylaxis s/p TKA  No Known Allergies  Patient Measurements:   Adjusted Body Weight:   Vital Signs: Temp: 98.6 F (37 C) (12/28 0623) Temp src: Oral (12/28 0623) BP: 137/74 mmHg (12/28 0623) Pulse Rate: 90  (12/28 0623)  Labs:  Basename 04/01/11 0532 03/31/11 0540  HGB 9.7* 10.9*  HCT 27.7* 31.7*  PLT 128* 161  APTT -- --  LABPROT 18.0* 14.4  INR 1.46 1.10  HEPARINUNFRC -- --  CREATININE -- 0.81  CKTOTAL -- --  CKMB -- --  TROPONINI -- --   CrCl is unknown because there is no height on file for the current visit.   Assessment: 60yom on Coumadin for VTE prophylaxis s/p TKA. INR (1.46) is subtherapeutic but trending up. INR increase slightly greater than desired, will decrease dose to avoid large INR jump.  - H/H and Plts decreased - No significant bleeding reported  Goal of Therapy:  INR 2-3   Plan:  1. Coumadin 6mg  po x 1 today 2. Follow-up AM INR and CBC  Cleon Dew [phone] 04/01/2011,8:52 AM

## 2011-04-01 NOTE — Progress Notes (Signed)
Physical Therapy Treatment Patient Details Name: Jared Castillo MRN: [id number] DOB: [birth year] Today's Date: 04/01/2011  PT Assessment/Plan  PT - Assessment/Plan Comments on Treatment Session: Pt. progressing well with ambulation. Will attempt stairs next session PT Plan: Discharge plan remains appropriate PT Frequency: 7X/week Follow Up Recommendations: Home health PT Equipment Recommended: Rolling walker with 5" wheels;3 in 1 bedside comode PT Goals  Acute Rehab PT Goals PT Goal: Supine/Side to Sit - Progress: Progressing toward goal PT Goal: Sit to Stand - Progress: Met PT Goal: Ambulate - Progress: Progressing toward goal PT Goal: Perform Home Exercise Program - Progress: Progressing toward goal  PT Treatment Precautions/Restrictions  Precautions Precautions: Knee Required Braces or Orthoses: No Restrictions Weight Bearing Restrictions: Yes RLE Weight Bearing: Weight bearing as tolerated Mobility (including Balance) Bed Mobility Supine to Sit: 6: Modified independent (Device/Increase time) Sitting - Scoot to Edge of Bed: 6: Modified independent (Device/Increase time) Transfers Sit to Stand: 6: Modified independent (Device/Increase time) Stand to Sit: 6: Modified independent (Device/Increase time) Ambulation/Gait Ambulation/Gait Assistance: 4: Min assist (MinGuard A) Ambulation/Gait Assistance Details (indicate cue type and reason): Cues to heel strike on R LE. Cues for LE positioning.  Ambulation Distance (Feet): 70 Feet Assistive device: Rolling walker Gait Pattern: Step-to pattern;Decreased stance time - right    Exercise  Total Joint Exercises Quad Sets: AROM;15 reps;Right Short Arc Quad: AAROM;Right;10 reps Heel Slides: AAROM;Right;10 reps Hip ABduction/ADduction: AAROM;Right;10 reps Straight Leg Raises: Right;AAROM;10 reps End of Session PT - End of Session Equipment Utilized During Treatment: Gait belt Activity Tolerance: Patient tolerated treatment  well Patient left: in chair;with call bell in reach General Behavior During Session: [facility] for tasks performed Cognition: [facility] for tasks performed  Conan Mcmanaway, Adline Potter 04/01/2011, 1:59 PM 04/01/2011 Fredrich Birks PTA [phone] pager [phone] office

## 2011-04-02 LAB — CBC
MCH: 31 pg (ref 26.0–34.0)
MCHC: 34.6 g/dL (ref 30.0–36.0)
MCV: 89.5 fL (ref 78.0–100.0)
Platelets: 132 10*3/uL — ABNORMAL LOW (ref 150–400)
RBC: 2.94 MIL/uL — ABNORMAL LOW (ref 4.22–5.81)
RDW: 13.1 % (ref 11.5–15.5)

## 2011-04-02 LAB — PROTIME-INR: Prothrombin Time: 17.9 seconds — ABNORMAL HIGH (ref 11.6–15.2)

## 2011-04-02 NOTE — Progress Notes (Signed)
Physical Therapy Treatment Patient Details Name: Jared Castillo MRN: [id number] DOB: 05/06/1950 Today's Date: 04/02/2011  PT Assessment/Plan  PT - Assessment/Plan Comments on Treatment Session: Pt and wife reviewed HEP. Pt progressing well with ambulation and stairs PT Plan: Discharge plan remains appropriate PT Frequency: 7X/week Follow Up Recommendations: Home health PT Equipment Recommended: Rolling walker with 5" wheels;3 in 1 bedside comode PT Goals  Acute Rehab PT Goals PT Goal: Sit to Stand - Progress: Progressing toward goal PT Goal: Ambulate - Progress: Progressing toward goal PT Goal: Up/Down Stairs - Progress: Progressing toward goal PT Goal: Perform Home Exercise Program - Progress: Progressing toward goal  PT Treatment Precautions/Restrictions  Precautions Precautions: Knee Required Braces or Orthoses: No Restrictions Weight Bearing Restrictions: Yes RLE Weight Bearing: Weight bearing as tolerated Mobility (including Balance) Transfers Sit to Stand: 6: Modified independent (Device/Increase time) Stand to Sit: 6: Modified independent (Device/Increase time) Ambulation/Gait Ambulation/Gait Assistance: 5: Supervision Ambulation/Gait Assistance Details (indicate cue type and reason): Cues for positioning of RW and cues to heel strike on RLE Ambulation Distance (Feet): 150 Feet Assistive device: Rolling walker Gait Pattern: Step-to pattern;Decreased stance time - right Stairs: Yes Stairs Assistance: 4: Min assist (MinGuard A) Stairs Assistance Details (indicate cue type and reason): cues for sequencing and technique Stair Management Technique: Two rails Number of Stairs: 2  (practiced twice)    Exercise    End of Session PT - End of Session Equipment Utilized During Treatment: Gait belt Activity Tolerance: Patient tolerated treatment well Patient left: in chair;with call bell in reach;with family/visitor present General Behavior During Session: [facility] for tasks  performed Cognition: [facility] for tasks performed  Stace Peace, Adline Potter 04/02/2011, 1:27 PM 04/02/2011 Fredrich Birks PTA [phone] pager [phone] office

## 2011-04-02 NOTE — Progress Notes (Signed)
Subjective: 3 Days Post-Op Procedure(s) (LRB): TOTAL KNEE ARTHROPLASTY (Right)  Patient reports pain as mild.    Objective:   VITALS:  Temp:  [97.9 F (36.6 C)-98.6 F (37 C)] 97.9 F (36.6 C) (12/29 0459) Pulse Rate:  [79-101] 79  (12/29 0459) Resp:  [18] 18  (12/29 0459) BP: (106-146)/(64-75) 114/64 mmHg (12/29 0459) SpO2:  [96 %-98 %] 96 % (12/29 0459) Weight:  [114.76 kg (253 lb)] 253 lb (114.76 kg) (12/28 2231)  Neurologically intact ABD soft Neurovascular intact Sensation intact distally Intact pulses distally Dorsiflexion/Plantar flexion intact Incision: scant drainage and no erythrema or purulence, min clear drainage distal 1/3 incision. Painted with Betadiene No cellulitis present Compartment soft   LABS  Basename 04/02/11 0600 04/01/11 0532 03/31/11 0540  HGB 9.1* 9.7* 10.9*  WBC 7.9 8.5 --  PLT 132* 128* --    Basename 03/31/11 0540  NA 135  K 3.9  CL 99  CO2 27  BUN 13  CREATININE 0.81  GLUCOSE 146*    Basename 04/02/11 0600 04/01/11 0532  LABPT -- --  INR 1.45 1.46     Assessment/Plan: 3 Days Post-Op Procedure(s) (LRB): TOTAL KNEE ARTHROPLASTY (Right)  Advance diet D/C IV fluids Discharge home with home health [facility] referral for PT OT and Nursing. Coumadin 1 mg per day per Dr. Reginia Forts E 04/02/2011, 9:54 AM

## 2011-04-13 NOTE — Discharge Summary (Signed)
Physician Discharge Summary  Patient ID: Jared Castillo MRN: [id number] DOB/AGE: 05/11/1950 61 y.o.  Admit date: 03/30/2011 Discharge date: 04/13/2011  Admission Diagnoses:osteoarthritis right knee  Discharge Diagnoses:  Principal Problem:  *Osteoarthritis of right knee   Discharged Condition:stable  Hospital Course:patients hospital course was essentially unremarkable, she underwent TKA and was d/c to home in stable condition.  Consults:none  Significant Diagnostic Studies:routine labs  Treatments: TKA  Discharge Exam: Blood pressure 114/64, pulse 79, temperature 97.9 F (36.6 C), temperature source Oral, resp. rate 18, height 6\' 3"  (1.905 m), weight 114.76 kg (253 lb), SpO2 96.00%. Incision clean and dry  Disposition: Home-Health Care Svc  Discharge Orders    Future Orders Please Complete By Expires   Diet - low sodium heart healthy      Call MD / Call 911      Comments:   If you experience chest pain or shortness of breath, CALL 911 and be transported to the hospital emergency room.  If you develope a fever above 101 F, pus (white drainage) or increased drainage or redness at the wound, or calf pain, call your surgeon's office.   Constipation Prevention      Comments:   Drink plenty of fluids.  Prune juice may be helpful.  You may use a stool softener, such as Colace (over the counter) 100 mg twice a day.  Use MiraLax (over the counter) for constipation as needed.   Increase activity slowly as tolerated      Weight Bearing as taught in Physical Therapy      Comments:   Use a walker or crutches as instructed.   Discharge instructions      Scheduling Instructions:   Ice packs as needed   Comments:   Change dressing daily or as needed. Keep wound dry and clean  Do ROM and strengthening exercises daily.  Walk as tolerated with walker .  Weight bearing as tolerated   Do not put a pillow under the knee. Place it under the heel.      Call MD / Call 911      Comments:   If  you experience chest pain or shortness of breath, CALL 911 and be transported to the hospital emergency room.  If you develope a fever above 101 F, pus (white drainage) or increased drainage or redness at the wound, or calf pain, call your surgeon's office.   Constipation Prevention      Comments:   Drink plenty of fluids.  Prune juice may be helpful.  You may use a stool softener, such as Colace (over the counter) 100 mg twice a day.  Use MiraLax (over the counter) for constipation as needed.   Increase activity slowly as tolerated      Weight Bearing as taught in Physical Therapy      Comments:   Use a walker or crutches as instructed.   Discharge instructions      Comments:   Keep dressing dry until no drainage. Paint incision with betadiene daily. Call if fever greater than 101 or if chills. Call if drainage worsens. Keep regular appointment .   Driving restrictions      Comments:   No driving for 6 weeks   Do not put a pillow under the knee. Place it under the heel.      Change dressing      Comments:   Change dressing onright knee, then change the dressing daily with sterile 4 x 4 inch gauze dressing and apply  TED hose.  You may clean the incision with betadiene  prior to redressing.     Medication List  As of 04/13/2011  9:38 PM   START taking these medications         warfarin 1 MG tablet   Commonly known as: COUMADIN   Take 1 tablet (1 mg total) by mouth daily.         CONTINUE taking these medications         HYDROcodone-acetaminophen 5-500 MG per tablet   Commonly known as: VICODIN      metoprolol tartrate 25 MG tablet   Commonly known as: LOPRESSOR      omeprazole 40 MG capsule   Commonly known as: PRILOSEC         STOP taking these medications         zolpidem 10 MG tablet          Where to get your medications    These are the prescriptions that you need to pick up.   You may get these medications from any pharmacy.         warfarin 1 MG tablet             Follow-up Information    Follow up with Jaiden Wahab V, MD in 2 weeks.   Contact information:   [address] [place] [address] [phone]          Signed: Nadara Mustard 04/13/2011, 9:38 PM

## 2012-04-06 ENCOUNTER — Emergency Department: Payer: Self-pay | Admitting: Emergency Medicine

## 2012-04-06 LAB — CBC
HGB: 14.2 g/dL (ref 13.0–18.0)
MCH: 30.6 pg (ref 26.0–34.0)
MCHC: 34.1 g/dL (ref 32.0–36.0)
MCV: 90 fL (ref 80–100)
Platelet: 186 10*3/uL (ref 150–440)
RBC: 4.64 10*6/uL (ref 4.40–5.90)
WBC: 9 10*3/uL (ref 3.8–10.6)

## 2012-04-06 LAB — CK TOTAL AND CKMB (NOT AT ARMC): CK, Total: 127 U/L (ref 35–232)

## 2012-04-06 LAB — BASIC METABOLIC PANEL
Anion Gap: 8 (ref 7–16)
Calcium, Total: 8.7 mg/dL (ref 8.5–10.1)
Co2: 25 mmol/L (ref 21–32)
EGFR (African American): >60
Glucose: 120 mg/dL — ABNORMAL HIGH (ref 65–99)
Potassium: 3.8 mmol/L (ref 3.5–5.1)

## 2012-11-29 ENCOUNTER — Emergency Department: Payer: Self-pay | Admitting: Emergency Medicine

## 2012-11-29 LAB — COMPREHENSIVE METABOLIC PANEL
Alkaline Phosphatase: 98 U/L (ref 50–136)
Anion Gap: 5 — ABNORMAL LOW (ref 7–16)
BUN: 19 mg/dL — ABNORMAL HIGH (ref 7–18)
Calcium, Total: 8.5 mg/dL (ref 8.5–10.1)
EGFR (Non-African Amer.): >60
Glucose: 131 mg/dL — ABNORMAL HIGH (ref 65–99)
Osmolality: 278 (ref 275–301)
SGPT (ALT): 45 U/L (ref 12–78)

## 2012-11-29 LAB — CBC
HGB: 14 g/dL (ref 13.0–18.0)
MCH: 31.5 pg (ref 26.0–34.0)
MCHC: 35.3 g/dL (ref 32.0–36.0)
MCV: 89 fL (ref 80–100)
Platelet: 159 10*3/uL (ref 150–440)
RBC: 4.43 10*6/uL (ref 4.40–5.90)
WBC: 5.5 10*3/uL (ref 3.8–10.6)

## 2012-11-29 LAB — URINALYSIS, COMPLETE
Bacteria: NONE SEEN
Blood: NEGATIVE
Ketone: NEGATIVE
Nitrite: NEGATIVE
Ph: 5 (ref 4.5–8.0)
RBC,UR: 3 /HPF (ref 0–5)
WBC UR: 7 /HPF (ref 0–5)

## 2012-11-30 LAB — URINE CULTURE

## 2013-12-25 ENCOUNTER — Emergency Department: Payer: Self-pay | Admitting: Emergency Medicine

## 2013-12-25 LAB — CBC
HCT: 42.2 % (ref 40.0–52.0)
HGB: 14.2 g/dL (ref 13.0–18.0)
MCH: 31 pg (ref 26.0–34.0)
MCHC: 33.6 g/dL (ref 32.0–36.0)
MCV: 92 fL (ref 80–100)
Platelet: 172 10*3/uL (ref 150–440)
RBC: 4.58 10*6/uL (ref 4.40–5.90)
RDW: 13.6 % (ref 11.5–14.5)
WBC: 7 10*3/uL (ref 3.8–10.6)

## 2013-12-25 LAB — TROPONIN I: Troponin-I: <0.02 ng/mL

## 2014-07-10 ENCOUNTER — Emergency Department
Admit: 2014-07-10 | Disposition: A | Discharge status: Home / Self Care | Payer: Self-pay | Admitting: Emergency Medicine

## 2015-08-07 ENCOUNTER — Emergency Department
Admission: EM | Admit: 2015-08-07 | Discharge: 2015-08-07 | Disposition: A | Discharge status: Home / Self Care | Payer: BLUE CROSS/BLUE SHIELD | Attending: Emergency Medicine | Admitting: Emergency Medicine

## 2015-08-07 DIAGNOSIS — N189 Chronic kidney disease, unspecified: Secondary | ICD-10-CM | POA: Insufficient documentation

## 2015-08-07 DIAGNOSIS — Z87891 Personal history of nicotine dependence: Secondary | ICD-10-CM | POA: Insufficient documentation

## 2015-08-07 DIAGNOSIS — Z7901 Long term (current) use of anticoagulants: Secondary | ICD-10-CM | POA: Insufficient documentation

## 2015-08-07 DIAGNOSIS — I129 Hypertensive chronic kidney disease with stage 1 through stage 4 chronic kidney disease, or unspecified chronic kidney disease: Secondary | ICD-10-CM | POA: Diagnosis not present

## 2015-08-07 DIAGNOSIS — M1711 Unilateral primary osteoarthritis, right knee: Secondary | ICD-10-CM | POA: Insufficient documentation

## 2015-08-07 DIAGNOSIS — E1165 Type 2 diabetes mellitus with hyperglycemia: Secondary | ICD-10-CM | POA: Diagnosis present

## 2015-08-07 DIAGNOSIS — R739 Hyperglycemia, unspecified: Secondary | ICD-10-CM

## 2015-08-07 HISTORY — DX: Type 2 diabetes mellitus without complications: E11.9

## 2015-08-07 LAB — BASIC METABOLIC PANEL
ANION GAP: 12 (ref 5–15)
BUN: 16 mg/dL (ref 6–20)
CHLORIDE: 92 mmol/L — AB (ref 101–111)
CO2: 24 mmol/L (ref 22–32)
CREATININE: 1.15 mg/dL (ref 0.61–1.24)
Calcium: 9.8 mg/dL (ref 8.9–10.3)
GFR calc non Af Amer: >60 mL/min (ref >60)
Glucose, Bld: 622 mg/dL (ref 65–99)
POTASSIUM: 4.4 mmol/L (ref 3.5–5.1)
SODIUM: 128 mmol/L — AB (ref 135–145)

## 2015-08-07 LAB — URINALYSIS COMPLETE WITH MICROSCOPIC (ARMC ONLY)
Bacteria, UA: NONE SEEN
Bilirubin Urine: NEGATIVE
Glucose, UA: >500 mg/dL — AB
Hgb urine dipstick: NEGATIVE
KETONES UR: NEGATIVE mg/dL
LEUKOCYTES UA: NEGATIVE
Nitrite: NEGATIVE
PH: 6 (ref 5.0–8.0)
PROTEIN: NEGATIVE mg/dL
SPECIFIC GRAVITY, URINE: 1.025 (ref 1.005–1.030)

## 2015-08-07 LAB — CBC
HCT: 46.1 % (ref 40.0–52.0)
HEMOGLOBIN: 15.9 g/dL (ref 13.0–18.0)
MCH: 31.2 pg (ref 26.0–34.0)
MCHC: 34.5 g/dL (ref 32.0–36.0)
MCV: 90.4 fL (ref 80.0–100.0)
PLATELETS: 203 10*3/uL (ref 150–440)
RBC: 5.1 MIL/uL (ref 4.40–5.90)
RDW: 13.9 % (ref 11.5–14.5)
WBC: 7.5 10*3/uL (ref 3.8–10.6)

## 2015-08-07 LAB — PROTIME-INR
INR: 0.9
Prothrombin Time: 12.4 seconds (ref 11.4–15.0)

## 2015-08-07 LAB — GLUCOSE, CAPILLARY
GLUCOSE-CAPILLARY: 498 mg/dL — AB (ref 65–99)
Glucose-Capillary: 570 mg/dL (ref 65–99)
Glucose-Capillary: 433 mg/dL — ABNORMAL HIGH (ref 65–99)

## 2015-08-07 LAB — TROPONIN I

## 2015-08-07 MED ORDER — INSULIN ASPART 100 UNIT/ML ~~LOC~~ SOLN
5.0000 [IU] | Freq: Once | SUBCUTANEOUS | Status: AC
  Administered 2015-08-07: 5 [IU] via SUBCUTANEOUS
  Filled 2015-08-07: qty 5

## 2015-08-07 MED ORDER — INSULIN ASPART 100 UNIT/ML ~~LOC~~ SOLN
SUBCUTANEOUS | Status: AC
  Administered 2015-08-07: 10 [IU] via SUBCUTANEOUS
  Filled 2015-08-07: qty 10

## 2015-08-07 MED ORDER — SODIUM CHLORIDE 0.9 % IV SOLN
Freq: Once | INTRAVENOUS | Status: AC
  Administered 2015-08-07: 21:00:00 via INTRAVENOUS

## 2015-08-07 MED ORDER — INSULIN ASPART 100 UNIT/ML ~~LOC~~ SOLN
10.0000 [IU] | Freq: Once | SUBCUTANEOUS | Status: AC
  Administered 2015-08-07: 10 [IU] via SUBCUTANEOUS

## 2015-08-07 MED ORDER — METFORMIN HCL 500 MG PO TABS
500.0000 mg | ORAL_TABLET | Freq: Once | ORAL | Status: AC
  Administered 2015-08-07: 500 mg via ORAL
  Filled 2015-08-07: qty 1

## 2015-08-07 NOTE — Discharge Instructions (Signed)
Hyperglycemia °Hyperglycemia occurs when the glucose (sugar) in your blood is too high. Hyperglycemia can happen for many reasons, but it most often happens to people who do not know they have diabetes or are not managing their diabetes properly.  °CAUSES  °Whether you have diabetes or not, there are other causes of hyperglycemia. Hyperglycemia can occur when you have diabetes, but it can also occur in other situations that you might not be as aware of, such as: °Diabetes °· If you have diabetes and are having problems controlling your blood glucose, hyperglycemia could occur because of some of the following reasons: °¨ Not following your meal plan. °¨ Not taking your diabetes medications or not taking it properly. °¨ Exercising less or doing less activity than you normally do. °¨ Being sick. °Pre-diabetes °· This cannot be ignored. Before people develop Type 2 diabetes, they almost always have "pre-diabetes." This is when your blood glucose levels are higher than normal, but not yet high enough to be diagnosed as diabetes. Research has shown that some long-term damage to the body, especially the heart and circulatory system, may already be occurring during pre-diabetes. If you take action to manage your blood glucose when you have pre-diabetes, you may delay or prevent Type 2 diabetes from developing. °Stress °· If you have diabetes, you may be "diet" controlled or on oral medications or insulin to control your diabetes. However, you may find that your blood glucose is higher than usual in the hospital whether you have diabetes or not. This is often referred to as "stress hyperglycemia." Stress can elevate your blood glucose. This happens because of hormones put out by the body during times of stress. If stress has been the cause of your high blood glucose, it can be followed regularly by your caregiver. That way he/she can make sure your hyperglycemia does not continue to get worse or progress to  diabetes. °Steroids °· Steroids are medications that act on the infection fighting system (immune system) to block inflammation or infection. One side effect can be a rise in blood glucose. Most people can produce enough extra insulin to allow for this rise, but for those who cannot, steroids make blood glucose levels go even higher. It is not unusual for steroid treatments to "uncover" diabetes that is developing. It is not always possible to determine if the hyperglycemia will go away after the steroids are stopped. A special blood test called an A1c is sometimes done to determine if your blood glucose was elevated before the steroids were started. °SYMPTOMS °· Thirsty. °· Frequent urination. °· Dry mouth. °· Blurred vision. °· Tired or fatigue. °· Weakness. °· Sleepy. °· Tingling in feet or leg. °DIAGNOSIS  °Diagnosis is made by monitoring blood glucose in one or all of the following ways: °· A1c test. This is a chemical found in your blood. °· Fingerstick blood glucose monitoring. °· Laboratory results. °TREATMENT  °First, knowing the cause of the hyperglycemia is important before the hyperglycemia can be treated. Treatment may include, but is not be limited to: °· Education. °· Change or adjustment in medications. °· Change or adjustment in meal plan. °· Treatment for an illness, infection, etc. °· More frequent blood glucose monitoring. °· Change in exercise plan. °· Decreasing or stopping steroids. °· Lifestyle changes. °HOME CARE INSTRUCTIONS  °· Test your blood glucose as directed. °· Exercise regularly. Your caregiver will give you instructions about exercise. Pre-diabetes or diabetes which comes on with stress is helped by exercising. °· Eat wholesome,   balanced meals. Eat often and at regular, fixed times. Your caregiver or nutritionist will give you a meal plan to guide your sugar intake. °· Being at an ideal weight is important. If needed, losing as little as 10 to 15 pounds may help improve blood  glucose levels. °SEEK MEDICAL CARE IF:  °· You have questions about medicine, activity, or diet. °· You continue to have symptoms (problems such as increased thirst, urination, or weight gain). °SEEK IMMEDIATE MEDICAL CARE IF:  °· You are vomiting or have diarrhea. °· Your breath smells fruity. °· You are breathing faster or slower. °· You are very sleepy or incoherent. °· You have numbness, tingling, or pain in your feet or hands. °· You have chest pain. °· Your symptoms get worse even though you have been following your caregiver's orders. °· If you have any other questions or concerns. °  °This information is not intended to replace advice given to you by your health care provider. Make sure you discuss any questions you have with your health care provider. °  °Document Released: 09/14/2000 Document Revised: 06/13/2011 Document Reviewed: 11/25/2014 °[employer] Interactive Patient Education ©2016 [employer] ° °

## 2015-08-07 NOTE — ED Provider Notes (Addendum)
[facility] Emergency Department Provider Note        Time seen: ----------------------------------------- 9:14 PM on 08/07/2015 -----------------------------------------    I have reviewed the triage vital signs and the nursing notes.   HISTORY  Chief Complaint Hyperglycemia    HPI Jared Castillo is a 65 y.o. male who presents ER for hypoglycemia. Patient states his wife realized he was not taking his medications as prescribed. Recently he was told to take metformin 2 pills twice daily and he is only taking 1 pill twice daily. His meter read high at home. He's had some blurry vision but denies other complaints.   Past Medical History  Diagnosis Date  . Shingles rash 13    dx yesterday 03/21/11  . Chronic kidney disease     stones  . GERD (gastroesophageal reflux disease)   . Hypertension   . Diabetes mellitus without complication [facility](HCC)     Patient Active Problem List   Diagnosis Date Noted  . Osteoarthritis of right knee 04/01/2011    Past Surgical History  Procedure Laterality Date  . Appendectomy      child  . 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 Total knee arthroplasty  03/30/2011    Procedure: TOTAL KNEE ARTHROPLASTY;  Surgeon: Nadara MustardMarcus V Duda, MD;  Location: [facility] OR;  Service: Orthopedics;  Laterality: Right;  Right Total Knee Arthroplastly    Allergies Review of patient's allergies indicates no known allergies.  Social History Social History  Substance Use Topics  . Smoking status: Former Games developermoker  . Smokeless tobacco: None  . Alcohol Use: 0.0 oz/week    2-3 Cans of beer per week   Review of Systems Constitutional: Negative for fever. Eyes: Positive for blurry vision ENT: Negative for sore throat. Cardiovascular: Negative for chest pain. Respiratory: Negative for shortness of breath. Gastrointestinal: Negative for abdominal pain, vomiting and diarrhea. Genitourinary: Negative for dysuria. Musculoskeletal: Negative for  back pain. Skin: Negative for rash. Neurological: Negative for headaches, focal weakness or numbness.  10-point ROS otherwise negative.  ____________________________________________   PHYSICAL EXAM:  VITAL SIGNS: ED Triage Vitals  Enc Vitals Group     BP 08/07/15 2051 156/100 mmHg     Pulse Rate 08/07/15 2051 88     Resp 08/07/15 2051 20     Temp 08/07/15 2051 97.4 F (36.3 C)     Temp Source 08/07/15 2051 Oral     SpO2 08/07/15 2051 94 %     Weight 08/07/15 2051 246 lb (111.585 kg)     Height 08/07/15 2051 6\' 1"  (1.854 m)     Head Cir --      Peak Flow --      Pain Score 08/07/15 2054 0     Pain Loc --      Pain Edu? --      Excl. in GC? --    Constitutional: Alert and oriented. Well appearing and in no distress. Eyes: Conjunctivae are normal. PERRL. Normal extraocular movements. ENT   Head: Normocephalic and atraumatic.   Nose: No congestion/rhinnorhea.   Mouth/Throat: Mucous membranes are moist.   Neck: No stridor. Cardiovascular: Normal rate, regular rhythm. No murmurs, rubs, or gallops. Respiratory: Normal respiratory effort without tachypnea nor retractions. Breath sounds are clear and equal bilaterally. No wheezes/rales/rhonchi. Gastrointestinal: Soft and nontender. Normal bowel sounds Musculoskeletal: Nontender with normal range of motion in all extremities. No lower extremity tenderness nor edema. Neurologic:  Normal speech and language. No gross focal  neurologic deficits are appreciated.  Skin:  Skin is warm, dry and intact. No rash noted. Psychiatric: Mood and affect are normal. Speech and behavior are normal.   ____________________________________________  ED COURSE:  Pertinent labs & imaging results that were available during my care of the patient were reviewed by me and considered in my medical decision making (see chart for details). Patient resents to ER with hyperglycemia, we will give IV fluids and insulin and  reevaluate. ____________________________________________    LABS (pertinent positives/negatives)  Labs Reviewed  GLUCOSE, CAPILLARY - Abnormal; Notable for the following:    Glucose-Capillary 570 (*)    All other components within normal limits  BASIC METABOLIC PANEL - Abnormal; Notable for the following:    Sodium 128 (*)    Chloride 92 (*)    Glucose, Bld 622 (*)    All other components within normal limits  URINALYSIS COMPLETEWITH MICROSCOPIC ([facility] ONLY) - Abnormal; Notable for the following:    Color, Urine COLORLESS (*)    APPearance CLEAR (*)    Glucose, UA >500 (*)    Squamous Epithelial / LPF 0-5 (*)    All other components within normal limits  GLUCOSE, CAPILLARY - Abnormal; Notable for the following:    Glucose-Capillary 498 (*)    All other components within normal limits  GLUCOSE, CAPILLARY - Abnormal; Notable for the following:    Glucose-Capillary 433 (*)    All other components within normal limits  CBC  PROTIME-INR  TROPONIN I  CBG MONITORING, ED  ____________________________________________  FINAL ASSESSMENT AND PLAN  Hyperglycemia  Plan: Patient with labs and imaging as dictated above. Patient with hyperglycemia that is somewhat related to medication noncompliance. Here he is received insulin subcutaneous as well as saline with good improvement in his blood sugars. It is unclear if doubling metformin is going to resolve his hyperglycemia, But the family wants to try doubling up on metformin before starting the glipizide which was recently prescribed.   Emily Filbert, MD   Note: This dictation was prepared with Dragon dictation. Any transcriptional errors that result from this process are unintentional   Emily Filbert, MD 08/07/15 4098  Emily Filbert, MD 08/07/15 2250

## 2015-08-07 NOTE — ED Notes (Signed)
Patient presents with complaints of hyperglycemia. Patient's wife states they just realized he has been taking his glucose medications incorrectly and has not been taking enough. Today his meter read "high" with 500 being the cutoff. Patient is alert and oriented, able to answer questions but chooses to let his wife answer what he doesn't recall. Glucose in triage is 570.

## 2017-01-13 ENCOUNTER — Emergency Department
Admission: EM | Admit: 2017-01-13 | Discharge: 2017-01-13 | Disposition: A | Discharge status: Home / Self Care | Payer: Managed Care, Other (non HMO) | Attending: Emergency Medicine | Admitting: Emergency Medicine

## 2017-01-13 DIAGNOSIS — N390 Urinary tract infection, site not specified: Secondary | ICD-10-CM | POA: Diagnosis not present

## 2017-01-13 DIAGNOSIS — Z79899 Other long term (current) drug therapy: Secondary | ICD-10-CM | POA: Diagnosis not present

## 2017-01-13 DIAGNOSIS — Z87891 Personal history of nicotine dependence: Secondary | ICD-10-CM | POA: Insufficient documentation

## 2017-01-13 DIAGNOSIS — N189 Chronic kidney disease, unspecified: Secondary | ICD-10-CM | POA: Insufficient documentation

## 2017-01-13 DIAGNOSIS — I129 Hypertensive chronic kidney disease with stage 1 through stage 4 chronic kidney disease, or unspecified chronic kidney disease: Secondary | ICD-10-CM | POA: Diagnosis not present

## 2017-01-13 DIAGNOSIS — Z7984 Long term (current) use of oral hypoglycemic drugs: Secondary | ICD-10-CM | POA: Insufficient documentation

## 2017-01-13 DIAGNOSIS — Z7901 Long term (current) use of anticoagulants: Secondary | ICD-10-CM | POA: Insufficient documentation

## 2017-01-13 DIAGNOSIS — E119 Type 2 diabetes mellitus without complications: Secondary | ICD-10-CM

## 2017-01-13 DIAGNOSIS — E1165 Type 2 diabetes mellitus with hyperglycemia: Secondary | ICD-10-CM | POA: Insufficient documentation

## 2017-01-13 DIAGNOSIS — R739 Hyperglycemia, unspecified: Secondary | ICD-10-CM

## 2017-01-13 LAB — GLUCOSE, CAPILLARY
GLUCOSE-CAPILLARY: 357 mg/dL — AB (ref 65–99)
GLUCOSE-CAPILLARY: 377 mg/dL — AB (ref 65–99)
Glucose-Capillary: 403 mg/dL — ABNORMAL HIGH (ref 65–99)
Glucose-Capillary: 265 mg/dL — ABNORMAL HIGH (ref 65–99)

## 2017-01-13 LAB — CBC
HCT: 45.2 % (ref 40.0–52.0)
Hemoglobin: 15.9 g/dL (ref 13.0–18.0)
MCH: 32 pg (ref 26.0–34.0)
MCHC: 35.1 g/dL (ref 32.0–36.0)
MCV: 91 fL (ref 80.0–100.0)
PLATELETS: 246 10*3/uL (ref 150–440)
RBC: 4.96 MIL/uL (ref 4.40–5.90)
RDW: 13.3 % (ref 11.5–14.5)
WBC: 10.9 10*3/uL — AB (ref 3.8–10.6)

## 2017-01-13 LAB — URINALYSIS, COMPLETE (UACMP) WITH MICROSCOPIC
BILIRUBIN URINE: NEGATIVE
Bacteria, UA: NONE SEEN
Ketones, ur: 5 mg/dL — AB
NITRITE: NEGATIVE
PH: 5 (ref 5.0–8.0)
Protein, ur: 30 mg/dL — AB
SPECIFIC GRAVITY, URINE: 1.023 (ref 1.005–1.030)
SQUAMOUS EPITHELIAL / LPF: NONE SEEN

## 2017-01-13 LAB — BASIC METABOLIC PANEL
Anion gap: 14 (ref 5–15)
BUN: 16 mg/dL (ref 6–20)
CALCIUM: 9.8 mg/dL (ref 8.9–10.3)
CO2: 24 mmol/L (ref 22–32)
CREATININE: 1.09 mg/dL (ref 0.61–1.24)
Chloride: 92 mmol/L — ABNORMAL LOW (ref 101–111)
Glucose, Bld: 423 mg/dL — ABNORMAL HIGH (ref 65–99)
Potassium: 4.4 mmol/L (ref 3.5–5.1)
SODIUM: 130 mmol/L — AB (ref 135–145)

## 2017-01-13 MED ORDER — CEPHALEXIN 500 MG PO CAPS
500.0000 mg | ORAL_CAPSULE | Freq: Once | ORAL | Status: AC
Start: 2017-01-13 — End: 2017-01-13
  Administered 2017-01-13: 500 mg via ORAL
  Filled 2017-01-13: qty 1

## 2017-01-13 MED ORDER — SODIUM CHLORIDE 0.9 % IV BOLUS (SEPSIS)
500.0000 mL | Freq: Once | INTRAVENOUS | Status: AC
Start: 2017-01-13 — End: 2017-01-13
  Administered 2017-01-13: 500 mL via INTRAVENOUS

## 2017-01-13 MED ORDER — CEPHALEXIN 500 MG PO CAPS: 500.0000 mg | ORAL_CAPSULE | Freq: Four times a day (QID) | ORAL | 0 refills | Status: DC

## 2017-01-13 MED ORDER — SODIUM CHLORIDE 0.9 % IV BOLUS (SEPSIS)
1000.0000 mL | Freq: Once | INTRAVENOUS | Status: AC
  Administered 2017-01-13: 1000 mL via INTRAVENOUS

## 2017-01-13 MED ORDER — INSULIN ASPART 100 UNIT/ML ~~LOC~~ SOLN
5.0000 [IU] | Freq: Once | SUBCUTANEOUS | Status: AC
  Administered 2017-01-13: 5 [IU] via INTRAVENOUS
  Filled 2017-01-13: qty 1

## 2017-01-13 MED ORDER — INSULIN ASPART 100 UNIT/ML ~~LOC~~ SOLN
4.0000 [IU] | Freq: Once | SUBCUTANEOUS | Status: AC
  Administered 2017-01-13: 4 [IU] via SUBCUTANEOUS
  Filled 2017-01-13: qty 1

## 2017-01-13 NOTE — ED Notes (Signed)
Pt arrives to the ER with complaints of increased urination and thirst. He is alert and oriented. Skin PWD.

## 2017-01-13 NOTE — Discharge Instructions (Signed)
You have been seen in the Emergency Department (ED) today for elevated blood sugar and increased urination.  Your workup today suggests that you have a urinary tract infection (UTI) and your diabetes was out of control.  Please take your antibiotic as prescribed and over-the-counter pain medication (Tylenol or Motrin) as needed, but no more than recommended on the label instructions.  Drink PLENTY of fluids. please make sure to check your blood sugar before each meal, please call your doctor or return to the emergency room if you have blood sugars over 350, or if you start to develop trouble with vision, confusion, weakness, or other new concerns arise  Call your regular doctor to schedule the next available appointment to follow up on today?s ED visit, or return immediately to the ED if your pain worsens, you have decreased urine production, develop fever, persistent vomiting, or other symptoms that concern you.

## 2017-01-13 NOTE — ED Triage Notes (Addendum)
Pt presents to ER via POV c/o high blood sugar today. Pt states that he just got a working meter recently. Reports that his normal is 110. Pt reports that he has had increase in urinary output over past few days. Denies pain. Pt alert and oriented X4, active, cooperative, pt in NAD. RR even and unlabored, color WNL.  Denies pain. Talks in full and complete sentences. Ambulates well. Denies recent sickness. Took  metformin PTA per family.

## 2017-01-13 NOTE — ED Notes (Signed)
Blood sugar now 265. MD notified.

## 2017-01-13 NOTE — ED Provider Notes (Signed)
[facility] Emergency Department Provider Note   ____________________________________________   First MD Initiated Contact with Patient 01/13/17 1825     (approximate)  I have reviewed the triage vital signs and the nursing notes.   HISTORY  Chief Complaint Hyperglycemia    HPI Jared Castillo is a 66 y.o. male refer evaluation of elevated blood sugars and increased urinary frequency and burning.  Patient reports he does not check his blood sugar in over a month because meters not working, but over the last 2 days began experiencing urgency urinate, slight burning with urination, and then over the last day developed a feeling of some lightheadedness.denies headache, denies vomiting. No chest or trouble breathing. No cough.  He bought a blood sugar meter yesterday and  blood sugar was approximately 500, he started taking his metformin regularly but reports is continued to have frequent urination and feels dehydrated.  No fevers or chills. see his primary doctor at [facility], he called them today and they advised she come to the ER   Past Medical History:  Diagnosis Date  . Chronic kidney disease    stones  . Diabetes mellitus without complication (HCC)   . GERD (gastroesophageal reflux disease)   . Hypertension   . Shingles rash 13   dx yesterday 03/21/11    Patient Active Problem List   Diagnosis Date Noted  . Osteoarthritis of right knee 04/01/2011    Past Surgical History:  Procedure Laterality Date  . APPENDECTOMY     child  . TOE AMPUTATION  79   rt great and second  . TOTAL KNEE ARTHROPLASTY  03/30/2011   Procedure: TOTAL KNEE ARTHROPLASTY;  Surgeon: Nadara Mustard, MD;  Location: [facility] OR;  Service: Orthopedics;  Laterality: Right;  Right Total Knee Arthroplastly    Prior to Admission medications   Medication Sig Start Date End Date Taking? Authorizing Provider  cephALEXin (KEFLEX) 500 MG capsule Take 1 capsule (500 mg total) by mouth 4  (four) times daily. 01/13/17   Sharyn Creamer, MD  HYDROcodone-acetaminophen (VICODIN) 5-500 MG per tablet Take 1 tablet by mouth every 6 (six) hours as needed. For pain     [provider]  metoprolol tartrate (LOPRESSOR) 25 MG tablet Take 25 mg by mouth 2 (two) times daily.      [provider]  omeprazole (PRILOSEC) 40 MG capsule Take 40 mg by mouth daily.      [provider]  warfarin (COUMADIN) 1 MG tablet Take 1 tablet (1 mg total) by mouth daily. 03/30/11 03/29/12  Nadara Mustard, MD  patient states no longer taking Coumadin He is now taking Januvia as well in addition to metformin 1000 twice daily  Allergies Patient has no known allergies.  No family history on file.  Social History Social History  Substance Use Topics  . Smoking status: Former Games developer  . Smokeless tobacco: Not on file  . Alcohol use 0.0 oz/week    2 - 3 Cans of beer per week    Review of Systems Constitutional: No fever/chills Eyes: No visual changes. ENT: No sore throat. Cardiovascular: Denies chest pain. Respiratory: Denies shortness of breath. Gastrointestinal: No abdominal pain.  No nausea, no vomiting.  No diarrhea.  No constipation. Genitourinary:see history of present illness Musculoskeletal: Negative for back pain. Skin: Negative for rash. Neurological: Negative for headaches, focal weakness or numbness.    ____________________________________________   PHYSICAL EXAM:  VITAL SIGNS: ED Triage Vitals  Enc Vitals Group  BP 01/13/17 1724 (!) 143/86     Pulse Rate 01/13/17 1724 85     Resp 01/13/17 1724 18     Temp 01/13/17 1724 97.7 F (36.5 C)     Temp Source 01/13/17 1724 Oral     SpO2 01/13/17 1724 96 %     Weight 01/13/17 1725 228 lb (103.4 kg)     Height 01/13/17 1725  (1.854 m)     Head Circumference --      Peak Flow --      Pain Score 01/13/17 1724 0     Pain Loc --      Pain Edu? --      Excl. in GC? --     Constitutional: Alert and  oriented. Well appearing and in no acute distress. Eyes: Conjunctivae are normal. Head: Atraumatic. Nose: No congestion/rhinnorhea. Mouth/Throat: Mucous membranes are slightly dry. Neck: No stridor.   Cardiovascular: Normal rate, regular rhythm. Grossly normal heart sounds.  Good peripheral circulation. Respiratory: Normal respiratory effort.  No retractions. Lungs CTAB. Gastrointestinal: Soft and nontender. No distention. Musculoskeletal: No lower extremity tenderness nor edema. Neurologic:  Normal speech and language. No gross focal neurologic deficits are appreciated.  Skin:  Skin is warm, dry and intact. No rash noted. Psychiatric: Mood and affect are normal. Speech and behavior are normal.  ____________________________________________   LABS (all labs ordered are listed, but only abnormal results are displayed)  Labs Reviewed  BASIC METABOLIC PANEL - Abnormal; Notable for the following:       Result Value   Sodium 130 (*)    Chloride 92 (*)    Glucose, Bld 423 (*)    All other components within normal limits  CBC - Abnormal; Notable for the following:    WBC 10.9 (*)    All other components within normal limits  URINALYSIS, COMPLETE (UACMP) WITH MICROSCOPIC - Abnormal; Notable for the following:    Color, Urine YELLOW (*)    APPearance CLEAR (*)    Glucose, UA >=500 (*)    Hgb urine dipstick MODERATE (*)    Ketones, ur 5 (*)    Protein, ur 30 (*)    Leukocytes, UA MODERATE (*)    All other components within normal limits  GLUCOSE, CAPILLARY - Abnormal; Notable for the following:    Glucose-Capillary 403 (*)    All other components within normal limits  GLUCOSE, CAPILLARY - Abnormal; Notable for the following:    Glucose-Capillary 357 (*)    All other components within normal limits  GLUCOSE, CAPILLARY - Abnormal; Notable for the following:    Glucose-Capillary 377 (*)    All other components within normal limits  GLUCOSE, CAPILLARY - Abnormal; Notable for the  following:    Glucose-Capillary 265 (*)    All other components within normal limits  URINE CULTURE  CBG MONITORING, ED  CBG MONITORING, ED  CBG MONITORING, ED  CBG MONITORING, ED   ____________________________________________  EKG   ____________________________________________  RADIOLOGY   ____________________________________________   PROCEDURES  Procedure(s) performed: None  Procedures  Critical Care performed: No  ____________________________________________   INITIAL IMPRESSION / ASSESSMENT AND PLAN / ED COURSE  Pertinent labs & imaging results that were available during my care of the patient were reviewed by me and considered in my medical decision making (see chart for details).  prescriptions for evaluation of elevated blood sugar. This seems to began after being experience increased frequency and burning with urination. Unclear if the frequency is related to  hyperglycemia, or potentially2 numerous to count white cells with moderate leukocytes. Out of caution, I have sent a urine culture and will start him on cephalexin in the event this is a urinary tract infection though the possibility of symptomatology including polyuria associated with his relatively uncontrolled LA glucose is also strongly considered. I suspect he is likely not highly compliant with his medications, as he is not compliant with blood sugar checks and reports to me that he frequently forgets to take medications.  ----------------------------------------- 10:24 PM on 01/13/2017 -----------------------------------------    Well-hydrated, Patient reports feeling much improved. Blood sugar now markedly improved. Discussed need for very close primary care, he and his wife report they will be calling his physician and they will begin checking his blood sugars routinely now. Carefully advised under close return precautions. Patient agreeable       ____________________________________________   FINAL CLINICAL IMPRESSION(S) / ED DIAGNOSES  Final diagnoses:  Hyperglycemia  Urinary tract infection, acute  Diabetes mellitus type 2 in nonobese (HCC)      NEW MEDICATIONS STARTED DURING THIS VISIT:  New Prescriptions   CEPHALEXIN (KEFLEX) 500 MG CAPSULE    Take 1 capsule (500 mg total) by mouth 4 (four) times daily.     Note:  This document was prepared using Dragon voice recognition software and may include unintentional dictation errors.     Sharyn Creamer, MD 01/13/17 2224

## 2017-01-13 NOTE — ED Notes (Signed)
Patient is resting comfortably. No complaints at this time. Pt is stable.

## 2017-01-13 NOTE — ED Notes (Signed)
Blood sugar now 377. MD notified and RN notified

## 2017-01-15 LAB — URINE CULTURE
CULTURE: NO GROWTH
SPECIAL REQUESTS: NORMAL

## 2017-01-16 ENCOUNTER — Emergency Department
Admission: EM | Admit: 2017-01-16 | Discharge: 2017-01-16 | Disposition: A | Discharge status: Home / Self Care | Payer: Managed Care, Other (non HMO) | Attending: Emergency Medicine | Admitting: Emergency Medicine

## 2017-01-16 ENCOUNTER — Emergency Department: Payer: Managed Care, Other (non HMO)

## 2017-01-16 DIAGNOSIS — E1165 Type 2 diabetes mellitus with hyperglycemia: Secondary | ICD-10-CM | POA: Diagnosis present

## 2017-01-16 DIAGNOSIS — R739 Hyperglycemia, unspecified: Secondary | ICD-10-CM

## 2017-01-16 DIAGNOSIS — I129 Hypertensive chronic kidney disease with stage 1 through stage 4 chronic kidney disease, or unspecified chronic kidney disease: Secondary | ICD-10-CM | POA: Diagnosis not present

## 2017-01-16 DIAGNOSIS — N200 Calculus of kidney: Secondary | ICD-10-CM | POA: Diagnosis not present

## 2017-01-16 DIAGNOSIS — Z87891 Personal history of nicotine dependence: Secondary | ICD-10-CM | POA: Diagnosis not present

## 2017-01-16 DIAGNOSIS — N189 Chronic kidney disease, unspecified: Secondary | ICD-10-CM | POA: Insufficient documentation

## 2017-01-16 LAB — URINALYSIS, COMPLETE (UACMP) WITH MICROSCOPIC
BACTERIA UA: NONE SEEN
Bilirubin Urine: NEGATIVE
Glucose, UA: >=500 mg/dL — AB
Ketones, ur: NEGATIVE mg/dL
NITRITE: NEGATIVE
PROTEIN: NEGATIVE mg/dL
SPECIFIC GRAVITY, URINE: 1.025 (ref 1.005–1.030)
pH: 5 (ref 5.0–8.0)

## 2017-01-16 LAB — CBC
HEMATOCRIT: 45.6 % (ref 40.0–52.0)
Hemoglobin: 15.7 g/dL (ref 13.0–18.0)
MCH: 31.5 pg (ref 26.0–34.0)
MCHC: 34.5 g/dL (ref 32.0–36.0)
MCV: 91.2 fL (ref 80.0–100.0)
Platelets: 236 10*3/uL (ref 150–440)
RBC: 5 MIL/uL (ref 4.40–5.90)
RDW: 13.2 % (ref 11.5–14.5)
WBC: 8 10*3/uL (ref 3.8–10.6)

## 2017-01-16 LAB — BASIC METABOLIC PANEL
ANION GAP: 12 (ref 5–15)
BUN: 17 mg/dL (ref 6–20)
CALCIUM: 9.6 mg/dL (ref 8.9–10.3)
CO2: 24 mmol/L (ref 22–32)
Chloride: 96 mmol/L — ABNORMAL LOW (ref 101–111)
Creatinine, Ser: 1.17 mg/dL (ref 0.61–1.24)
GFR calc Af Amer: >60 mL/min (ref >60)
GLUCOSE: 396 mg/dL — AB (ref 65–99)
POTASSIUM: 4.3 mmol/L (ref 3.5–5.1)
SODIUM: 132 mmol/L — AB (ref 135–145)

## 2017-01-16 LAB — GLUCOSE, CAPILLARY
GLUCOSE-CAPILLARY: 392 mg/dL — AB (ref 65–99)
GLUCOSE-CAPILLARY: 344 mg/dL — AB (ref 65–99)
GLUCOSE-CAPILLARY: 283 mg/dL — AB (ref 65–99)

## 2017-01-16 MED ORDER — SODIUM CHLORIDE 0.9 % IV SOLN
Freq: Once | INTRAVENOUS | Status: AC
  Administered 2017-01-16: 19:00:00 via INTRAVENOUS

## 2017-01-16 MED ORDER — OXYCODONE-ACETAMINOPHEN 5-325 MG PO TABS: 2.0000 | ORAL_TABLET | Freq: Four times a day (QID) | ORAL | 0 refills | Status: DC | PRN

## 2017-01-16 MED ORDER — CEFTRIAXONE SODIUM IN DEXTROSE 20 MG/ML IV SOLN
1.0000 g | Freq: Once | INTRAVENOUS | Status: AC
  Administered 2017-01-16: 1 g via INTRAVENOUS
  Filled 2017-01-16: qty 50

## 2017-01-16 MED ORDER — CIPROFLOXACIN HCL 500 MG PO TABS: 500.0000 mg | ORAL_TABLET | Freq: Two times a day (BID) | ORAL | 0 refills | Status: AC

## 2017-01-16 MED ORDER — INSULIN ASPART 100 UNIT/ML ~~LOC~~ SOLN
10.0000 [IU] | Freq: Once | SUBCUTANEOUS | Status: AC
  Administered 2017-01-16: 10 [IU] via SUBCUTANEOUS
  Filled 2017-01-16: qty 1

## 2017-01-16 NOTE — ED Notes (Signed)
EDP at bedside  

## 2017-01-16 NOTE — ED Triage Notes (Signed)
Pt states that he was treated for hyperglycemia on Friday in ER with fluids. Pt sent home. Pt reports blood sugar 237 at time of discharge. Reports blood sugar rising since going home. 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 Pt alert and oriented X4, active, cooperative, pt in NAD. RR even and unlabored, color WNL.  Denies CP or SOB.

## 2017-01-16 NOTE — ED Notes (Signed)
Blood glucose 283

## 2017-01-16 NOTE — ED Notes (Signed)
Patient transported to CT 

## 2017-01-16 NOTE — ED Provider Notes (Addendum)
[facility] Emergency Department Provider Note       Time seen: ----------------------------------------- 6:42 PM on 01/16/2017 -----------------------------------------     I have reviewed the triage vital signs and the nursing notes.   HISTORY   Chief Complaint Hyperglycemia    HPI Jared Castillo is a 66 y.o. male with a history of diabetes and chronic kidney disease who presents to the ED for hyperglycemia. Patient was seen here on Friday with hyperglycemia and was given fluids. patient reports his blood sugar has been running high lately for the last week. She's been treated for a possible prostate infection and is currently taking oral antibiotics. He denies any pain of any kind.  Past Medical History:  Diagnosis Date  . Chronic kidney disease    stones  . Diabetes mellitus without complication (HCC)   . GERD (gastroesophageal reflux disease)   . Hypertension   . Shingles rash 13   dx yesterday 03/21/11    Patient Active Problem List   Diagnosis Date Noted  . Osteoarthritis of right knee 04/01/2011    Past Surgical History:  Procedure Laterality Date  . APPENDECTOMY     child  . TOE AMPUTATION  79   rt great and second  . TOTAL KNEE ARTHROPLASTY  03/30/2011   Procedure: TOTAL KNEE ARTHROPLASTY;  Surgeon: Nadara Mustard, MD;  Location: [facility] OR;  Service: Orthopedics;  Laterality: Right;  Right Total Knee Arthroplastly    Allergies Patient has no known allergies.  Social History Social History  Substance Use Topics  . Smoking status: Former Games developer  . Smokeless tobacco: Not on file  . Alcohol use 0.0 oz/week    2 - 3 Cans of beer per week    Review of Systems Constitutional: Negative for fever. Eyes: Negative for vision changes ENT:  Negative for congestion, sore throat Cardiovascular: Negative for chest pain. Respiratory: Negative for shortness of breath. Gastrointestinal: Negative for abdominal pain, vomiting and  diarrhea. Genitourinary: Negative for dysuria. Musculoskeletal: Negative for back pain. Skin: Negative for rash. Neurological: Negative for headaches, focal weakness or numbness.  All systems negative/normal/unremarkable except as stated in the HPI  ____________________________________________   PHYSICAL EXAM:  VITAL SIGNS: ED Triage Vitals  Enc Vitals Group     BP 01/16/17 1641 138/83     Pulse Rate 01/16/17 1641 78     Resp 01/16/17 1641 20     Temp 01/16/17 1641 98.2 F (36.8 C)     Temp Source 01/16/17 1641 Oral     SpO2 01/16/17 1641 96 %     Weight 01/16/17 1641 228 lb (103.4 kg)     Height 01/16/17 1641  (1.854 m)     Head Circumference --      Peak Flow --      Pain Score 01/16/17 1644 0     Pain Loc --      Pain Edu? --      Excl. in GC? --     Constitutional: Alert and oriented. Well appearing and in no distress. Eyes: Conjunctivae are normal. Normal extraocular movements. ENT   Head: Normocephalic and atraumatic.   Nose: No congestion/rhinnorhea.   Mouth/Throat: Mucous membranes are moist.   Neck: No stridor. Cardiovascular: Normal rate, regular rhythm. No murmurs, rubs, or gallops. Respiratory: Normal respiratory effort without tachypnea nor retractions. Breath sounds are clear and equal bilaterally. No wheezes/rales/rhonchi. Gastrointestinal: Soft and nontender. Normal bowel sounds Musculoskeletal: Nontender with normal range of motion in extremities. No lower extremity  tenderness nor edema. Neurologic:  Normal speech and language. No gross focal neurologic deficits are appreciated.  Skin:  Skin is warm, dry and intact. No rash noted. Psychiatric: Mood and affect are normal. Speech and behavior are normal.  ____________________________________________  ED COURSE:  Pertinent labs & imaging results that were available during my care of the patient were reviewed by me and considered in my medical decision making (see chart for  details). Patient presents for hyperglycemia, we will assess with labs and imaging as indicated.   Procedures ____________________________________________   LABS (pertinent positives/negatives)  Labs Reviewed  BASIC METABOLIC PANEL - Abnormal; Notable for the following:       Result Value   Sodium 132 (*)    Chloride 96 (*)    Glucose, Bld 396 (*)    All other components within normal limits  URINALYSIS, COMPLETE (UACMP) WITH MICROSCOPIC - Abnormal; Notable for the following:    Color, Urine STRAW (*)    APPearance CLEAR (*)    Glucose, UA >=500 (*)    Hgb urine dipstick SMALL (*)    Leukocytes, UA SMALL (*)    Squamous Epithelial / LPF 0-5 (*)    All other components within normal limits  GLUCOSE, CAPILLARY - Abnormal; Notable for the following:    Glucose-Capillary 392 (*)    All other components within normal limits  URINE CULTURE  CBC  CBG MONITORING, ED    RADIOLOGY Images were viewed by me  CT renal protocol  IMPRESSION: 1. Left kidney 1.5 cm stone within the left renal pelvis. Left kidney pelvicaliectasis and surrounding edema may be due to obstruction by the large pelvic stone or infection of the collecting system. 2. Bilateral nephrolithiasis. No ureter stone or hydroureter. 3. Hepatic steatosis. 4. Three liver lesions measuring up to 2.7 cm are stable and probably represent hemangioma or cyst. 5. Aortic atherosclerosis. 6. Mild prostate enlargement. 7. Small right inguinal hernia containing fat. ____________________________________________  DIFFERENTIAL DIAGNOSIS   hyperglycemia, UTI, prostatitis, pyelonephritis, dehydration, DKA   FINAL ASSESSMENT AND PLAN  Hyperglycemia, kidney stone  Plan: Patient had presented for elevated blood sugars. Patients labs were indeed indicative of that with an initial blood glucose of 392. Patients imaging revealed a 1.5 cm stone in the left renal pelvis. 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 I discussed with  urology on-call and he will see him in the next day or so. We did give him IV Rocephin here and we'll change his antibiotic to Cipro. Patient has no fever, no leukocytosis or pain and looks remarkably well. He may require a procedure for stone extraction. I will also advise him to double his Januvia for the next several days. Otherwise he is stable for outpatient follow-up. Patient is agreeable to plan.   Emily Filbert, MD   Note: This note was generated in part or whole with voice recognition software. Voice recognition is usually quite accurate but there are transcription errors that can and very often do occur. I apologize for any typographical errors that were not detected and corrected.     Emily Filbert, MD 01/16/17 2006    Emily Filbert, MD 01/16/17 2008

## 2017-01-18 LAB — URINE CULTURE
Culture: NO GROWTH
SPECIAL REQUESTS: NORMAL

## 2019-08-22 ENCOUNTER — Inpatient Hospital Stay: Payer: Managed Care, Other (non HMO)

## 2019-08-22 ENCOUNTER — Other Ambulatory Visit: Payer: Self-pay

## 2019-08-22 ENCOUNTER — Inpatient Hospital Stay
Admission: AD | Admit: 2019-08-22 | Discharge: 2019-08-25 | DRG: 629 | Disposition: A | Discharge status: Home Health | Payer: Managed Care, Other (non HMO) | Source: Ambulatory Visit | Attending: Internal Medicine | Admitting: Internal Medicine

## 2019-08-22 ENCOUNTER — Encounter: Payer: Self-pay | Admitting: Internal Medicine

## 2019-08-22 DIAGNOSIS — N189 Chronic kidney disease, unspecified: Secondary | ICD-10-CM | POA: Diagnosis present

## 2019-08-22 DIAGNOSIS — Z7901 Long term (current) use of anticoagulants: Secondary | ICD-10-CM | POA: Diagnosis not present

## 2019-08-22 DIAGNOSIS — I129 Hypertensive chronic kidney disease with stage 1 through stage 4 chronic kidney disease, or unspecified chronic kidney disease: Secondary | ICD-10-CM | POA: Diagnosis present

## 2019-08-22 DIAGNOSIS — M86171 Other acute osteomyelitis, right ankle and foot: Secondary | ICD-10-CM | POA: Diagnosis not present

## 2019-08-22 DIAGNOSIS — Z20822 Contact with and (suspected) exposure to covid-19: Secondary | ICD-10-CM | POA: Diagnosis present

## 2019-08-22 DIAGNOSIS — Z79891 Long term (current) use of opiate analgesic: Secondary | ICD-10-CM

## 2019-08-22 DIAGNOSIS — E1140 Type 2 diabetes mellitus with diabetic neuropathy, unspecified: Secondary | ICD-10-CM | POA: Diagnosis present

## 2019-08-22 DIAGNOSIS — Z89411 Acquired absence of right great toe: Secondary | ICD-10-CM

## 2019-08-22 DIAGNOSIS — L97512 Non-pressure chronic ulcer of other part of right foot with fat layer exposed: Secondary | ICD-10-CM | POA: Diagnosis not present

## 2019-08-22 DIAGNOSIS — E11621 Type 2 diabetes mellitus with foot ulcer: Secondary | ICD-10-CM | POA: Diagnosis present

## 2019-08-22 DIAGNOSIS — L03115 Cellulitis of right lower limb: Secondary | ICD-10-CM

## 2019-08-22 DIAGNOSIS — I10 Essential (primary) hypertension: Secondary | ICD-10-CM | POA: Diagnosis not present

## 2019-08-22 DIAGNOSIS — Z79899 Other long term (current) drug therapy: Secondary | ICD-10-CM | POA: Diagnosis not present

## 2019-08-22 DIAGNOSIS — E1122 Type 2 diabetes mellitus with diabetic chronic kidney disease: Secondary | ICD-10-CM | POA: Diagnosis present

## 2019-08-22 DIAGNOSIS — K219 Gastro-esophageal reflux disease without esophagitis: Secondary | ICD-10-CM | POA: Diagnosis present

## 2019-08-22 DIAGNOSIS — L089 Local infection of the skin and subcutaneous tissue, unspecified: Secondary | ICD-10-CM | POA: Diagnosis not present

## 2019-08-22 DIAGNOSIS — E11628 Type 2 diabetes mellitus with other skin complications: Secondary | ICD-10-CM | POA: Diagnosis not present

## 2019-08-22 DIAGNOSIS — Z89421 Acquired absence of other right toe(s): Secondary | ICD-10-CM

## 2019-08-22 DIAGNOSIS — M869 Osteomyelitis, unspecified: Secondary | ICD-10-CM | POA: Diagnosis present

## 2019-08-22 DIAGNOSIS — E1165 Type 2 diabetes mellitus with hyperglycemia: Secondary | ICD-10-CM

## 2019-08-22 LAB — CBC WITH DIFFERENTIAL/PLATELET
Abs Immature Granulocytes: 0.02 10*3/uL (ref 0.00–0.07)
Basophils Absolute: 0 10*3/uL (ref 0.0–0.1)
Basophils Relative: 0 %
Eosinophils Absolute: 0.3 10*3/uL (ref 0.0–0.5)
Eosinophils Relative: 4 %
HCT: 33.9 % — ABNORMAL LOW (ref 39.0–52.0)
Hemoglobin: 12.1 g/dL — ABNORMAL LOW (ref 13.0–17.0)
Immature Granulocytes: 0 %
Lymphocytes Relative: 35 %
Lymphs Abs: 2.6 10*3/uL (ref 0.7–4.0)
MCH: 29.8 pg (ref 26.0–34.0)
MCHC: 35.7 g/dL (ref 30.0–36.0)
MCV: 83.5 fL (ref 80.0–100.0)
Monocytes Absolute: 0.9 10*3/uL (ref 0.1–1.0)
Monocytes Relative: 12 %
Neutro Abs: 3.7 10*3/uL (ref 1.7–7.7)
Neutrophils Relative %: 49 %
Platelets: 277 10*3/uL (ref 150–400)
RBC: 4.06 MIL/uL — ABNORMAL LOW (ref 4.22–5.81)
RDW: 12.2 % (ref 11.5–15.5)
WBC: 7.5 10*3/uL (ref 4.0–10.5)
nRBC: 0 % (ref 0.0–0.2)

## 2019-08-22 LAB — COMPREHENSIVE METABOLIC PANEL
ALT: 18 U/L (ref 0–44)
AST: 14 U/L — ABNORMAL LOW (ref 15–41)
Albumin: 3.2 g/dL — ABNORMAL LOW (ref 3.5–5.0)
Alkaline Phosphatase: 85 U/L (ref 38–126)
Anion gap: 11 (ref 5–15)
BUN: 14 mg/dL (ref 8–23)
CO2: 28 mmol/L (ref 22–32)
Calcium: 9.3 mg/dL (ref 8.9–10.3)
Chloride: 98 mmol/L (ref 98–111)
Creatinine, Ser: 0.95 mg/dL (ref 0.61–1.24)
GFR calc Af Amer: >60 mL/min (ref >60)
GFR calc non Af Amer: >60 mL/min (ref >60)
Glucose, Bld: 195 mg/dL — ABNORMAL HIGH (ref 70–99)
Potassium: 3.6 mmol/L (ref 3.5–5.1)
Sodium: 137 mmol/L (ref 135–145)
Total Bilirubin: 0.5 mg/dL (ref 0.3–1.2)
Total Protein: 7.6 g/dL (ref 6.5–8.1)

## 2019-08-22 LAB — PROTIME-INR
INR: 1 (ref 0.8–1.2)
Prothrombin Time: 12.4 seconds (ref 11.4–15.2)

## 2019-08-22 LAB — GLUCOSE, CAPILLARY: Glucose-Capillary: 133 mg/dL — ABNORMAL HIGH (ref 70–99)

## 2019-08-22 MED ORDER — ACETAMINOPHEN 325 MG PO TABS: 650.0000 mg | ORAL_TABLET | Freq: Four times a day (QID) | ORAL | Status: DC | PRN

## 2019-08-22 MED ORDER — CHLORHEXIDINE GLUCONATE 4 % EX LIQD
60.0000 mL | Freq: Once | CUTANEOUS | Status: AC
  Administered 2019-08-23: 4 via TOPICAL

## 2019-08-22 MED ORDER — MORPHINE SULFATE (PF) 2 MG/ML IV SOLN
2.0000 mg | INTRAVENOUS | Status: DC | PRN
Start: 2019-08-22 — End: 2019-08-25
  Administered 2019-08-22 – 2019-08-25 (×12): 2 mg via INTRAVENOUS
  Filled 2019-08-22 (×12): qty 1

## 2019-08-22 MED ORDER — POVIDONE-IODINE 10 % EX SWAB: 2.0000 "application " | Freq: Once | CUTANEOUS | Status: DC

## 2019-08-22 MED ORDER — VANCOMYCIN HCL 2000 MG/400ML IV SOLN
2000.0000 mg | Freq: Once | INTRAVENOUS | Status: AC
  Administered 2019-08-22: 2000 mg via INTRAVENOUS
  Filled 2019-08-22: qty 400

## 2019-08-22 MED ORDER — ONDANSETRON HCL 4 MG PO TABS: 4.0000 mg | ORAL_TABLET | Freq: Four times a day (QID) | ORAL | Status: DC | PRN

## 2019-08-22 MED ORDER — VANCOMYCIN HCL IN DEXTROSE 1-5 GM/200ML-% IV SOLN: 1000.0000 mg | Freq: Once | INTRAVENOUS | Status: DC

## 2019-08-22 MED ORDER — PIPERACILLIN-TAZOBACTAM 3.375 G IVPB 30 MIN: 3.3750 g | Freq: Once | INTRAVENOUS | Status: DC

## 2019-08-22 MED ORDER — PIPERACILLIN-TAZOBACTAM 3.375 G IVPB
3.3750 g | Freq: Three times a day (TID) | INTRAVENOUS | Status: DC
  Administered 2019-08-22 – 2019-08-23 (×2): 3.375 g via INTRAVENOUS
  Filled 2019-08-22 (×5): qty 50

## 2019-08-22 MED ORDER — HYDROCODONE-ACETAMINOPHEN 5-325 MG PO TABS
1.0000 | ORAL_TABLET | ORAL | Status: DC | PRN
  Administered 2019-08-24 – 2019-08-25 (×6): 2 via ORAL
  Filled 2019-08-22 (×6): qty 2

## 2019-08-22 MED ORDER — VANCOMYCIN HCL IN DEXTROSE 1-5 GM/200ML-% IV SOLN
1000.0000 mg | Freq: Two times a day (BID) | INTRAVENOUS | Status: DC
  Filled 2019-08-22 (×2): qty 200

## 2019-08-22 MED ORDER — INSULIN ASPART 100 UNIT/ML ~~LOC~~ SOLN
0.0000 [IU] | Freq: Three times a day (TID) | SUBCUTANEOUS | Status: DC
  Administered 2019-08-23: 11 [IU] via SUBCUTANEOUS
  Administered 2019-08-24: 5 [IU] via SUBCUTANEOUS
  Administered 2019-08-24: 8 [IU] via SUBCUTANEOUS
  Administered 2019-08-24: 12:00:00 5 [IU] via SUBCUTANEOUS
  Administered 2019-08-25: 3 [IU] via SUBCUTANEOUS
  Administered 2019-08-25: 5 [IU] via SUBCUTANEOUS
  Filled 2019-08-22 (×6): qty 1

## 2019-08-22 MED ORDER — METOPROLOL TARTRATE 25 MG PO TABS
25.0000 mg | ORAL_TABLET | Freq: Two times a day (BID) | ORAL | Status: DC
  Administered 2019-08-22 – 2019-08-25 (×5): 25 mg via ORAL
  Filled 2019-08-22 (×5): qty 1

## 2019-08-22 MED ORDER — INSULIN ASPART 100 UNIT/ML ~~LOC~~ SOLN
0.0000 [IU] | Freq: Every day | SUBCUTANEOUS | Status: DC
  Administered 2019-08-23: 23:00:00 5 [IU] via SUBCUTANEOUS
  Administered 2019-08-24: 2 [IU] via SUBCUTANEOUS
  Filled 2019-08-22 (×2): qty 1

## 2019-08-22 MED ORDER — ACETAMINOPHEN 650 MG RE SUPP: 650.0000 mg | Freq: Four times a day (QID) | RECTAL | Status: DC | PRN

## 2019-08-22 MED ORDER — ENOXAPARIN SODIUM 40 MG/0.4ML ~~LOC~~ SOLN
40.0000 mg | SUBCUTANEOUS | Status: DC
  Administered 2019-08-23 – 2019-08-24 (×2): 40 mg via SUBCUTANEOUS
  Filled 2019-08-22 (×2): qty 0.4

## 2019-08-22 MED ORDER — PANTOPRAZOLE SODIUM 40 MG PO TBEC
40.0000 mg | DELAYED_RELEASE_TABLET | Freq: Every day | ORAL | Status: DC
  Administered 2019-08-24 – 2019-08-25 (×2): 40 mg via ORAL
  Filled 2019-08-22 (×2): qty 1

## 2019-08-22 MED ORDER — ONDANSETRON HCL 4 MG/2ML IJ SOLN: 4.0000 mg | Freq: Four times a day (QID) | INTRAMUSCULAR | Status: DC | PRN

## 2019-08-22 NOTE — Care Management (Signed)
This is a no charge note  Direct admission from podiatrist, Dr. Bernette Redbird office.  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  Patient does not have fever.  Dr. Excell Seltzer recommended to get MRI without contrast and start pt with IV antibiotics such as vancomycin and Zosyn per Dr. Excell Seltzer.  Need blood culture and wound culture.   Lorretta Harp, MD  [facility]   If 7PM-7AM, please contact night-coverage [email or web address] 08/22/2019, 3:59 PM

## 2019-08-22 NOTE — Progress Notes (Signed)
Pharmacy Antibiotic Note  Jared Castillo is a 69 y.o. male admitted on 08/22/2019. Pharmacy has been consulted for vancomycin and Zosyn dosing for cellulitis.  Plan: Vanc 2 g IV x 1 followed by 1000 mg IV q12h to start tomorrow at 1000  Zosyn 3.375 g IV q8h extended infusion     No data recorded.  Recent Labs  Lab 08/22/19 1847  WBC 7.5  CREATININE 0.95    CrCl cannot be calculated (Unknown ideal weight.).    No Known Allergies  Antimicrobials this admission: Vancomycin 5/20 >> Zosyn 5/20 >>  Dose adjustments this admission: NA  Microbiology results:   Thank you for allowing pharmacy to be a part of this patient's care.  Pricilla Riffle, PharmD 08/22/2019 7:52 PM
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
## 2019-08-23 ENCOUNTER — Other Ambulatory Visit: Payer: Self-pay

## 2019-08-23 ENCOUNTER — Inpatient Hospital Stay: Payer: Managed Care, Other (non HMO) | Admitting: Anesthesiology

## 2019-08-23 ENCOUNTER — Encounter: Payer: Self-pay | Admitting: Internal Medicine

## 2019-08-23 ENCOUNTER — Encounter
Admission: AD | Disposition: A | Discharge status: Home Health | Payer: Self-pay | Source: Ambulatory Visit | Attending: Internal Medicine

## 2019-08-23 DIAGNOSIS — L089 Local infection of the skin and subcutaneous tissue, unspecified: Secondary | ICD-10-CM

## 2019-08-23 DIAGNOSIS — E1165 Type 2 diabetes mellitus with hyperglycemia: Secondary | ICD-10-CM

## 2019-08-23 DIAGNOSIS — L97512 Non-pressure chronic ulcer of other part of right foot with fat layer exposed: Secondary | ICD-10-CM

## 2019-08-23 DIAGNOSIS — I10 Essential (primary) hypertension: Secondary | ICD-10-CM

## 2019-08-23 DIAGNOSIS — E11628 Type 2 diabetes mellitus with other skin complications: Secondary | ICD-10-CM

## 2019-08-23 HISTORY — PX: IRRIGATION AND DEBRIDEMENT FOOT: SHX6602

## 2019-08-23 LAB — BASIC METABOLIC PANEL
Anion gap: 9 (ref 5–15)
BUN: 15 mg/dL (ref 8–23)
CO2: 27 mmol/L (ref 22–32)
Calcium: 8.8 mg/dL — ABNORMAL LOW (ref 8.9–10.3)
Chloride: 98 mmol/L (ref 98–111)
Creatinine, Ser: 0.75 mg/dL (ref 0.61–1.24)
GFR calc Af Amer: >60 mL/min (ref >60)
GFR calc non Af Amer: >60 mL/min (ref >60)
Glucose, Bld: 218 mg/dL — ABNORMAL HIGH (ref 70–99)
Potassium: 3.6 mmol/L (ref 3.5–5.1)
Sodium: 134 mmol/L — ABNORMAL LOW (ref 135–145)

## 2019-08-23 LAB — CBC
HCT: 33 % — ABNORMAL LOW (ref 39.0–52.0)
Hemoglobin: 11.5 g/dL — ABNORMAL LOW (ref 13.0–17.0)
MCH: 30.1 pg (ref 26.0–34.0)
MCHC: 34.8 g/dL (ref 30.0–36.0)
MCV: 86.4 fL (ref 80.0–100.0)
Platelets: 263 10*3/uL (ref 150–400)
RBC: 3.82 MIL/uL — ABNORMAL LOW (ref 4.22–5.81)
RDW: 12.2 % (ref 11.5–15.5)
WBC: 8.3 10*3/uL (ref 4.0–10.5)
nRBC: 0 % (ref 0.0–0.2)

## 2019-08-23 LAB — GLUCOSE, CAPILLARY
Glucose-Capillary: 225 mg/dL — ABNORMAL HIGH (ref 70–99)
Glucose-Capillary: 263 mg/dL — ABNORMAL HIGH (ref 70–99)
Glucose-Capillary: 229 mg/dL — ABNORMAL HIGH (ref 70–99)
Glucose-Capillary: 287 mg/dL — ABNORMAL HIGH (ref 70–99)
Glucose-Capillary: 329 mg/dL — ABNORMAL HIGH (ref 70–99)
Glucose-Capillary: 384 mg/dL — ABNORMAL HIGH (ref 70–99)

## 2019-08-23 LAB — SARS CORONAVIRUS 2 BY RT PCR (HOSPITAL ORDER, PERFORMED IN ~~LOC~~ HOSPITAL LAB): SARS Coronavirus 2: NEGATIVE

## 2019-08-23 LAB — HIV ANTIBODY (ROUTINE TESTING W REFLEX): HIV Screen 4th Generation wRfx: NONREACTIVE

## 2019-08-23 SURGERY — IRRIGATION AND DEBRIDEMENT FOOT
Anesthesia: General | Laterality: Right

## 2019-08-23 MED ORDER — PIPERACILLIN-TAZOBACTAM 3.375 G IVPB
3.3750 g | Freq: Three times a day (TID) | INTRAVENOUS | Status: AC
  Administered 2019-08-23: 16:00:00 3.375 g via INTRAVENOUS
  Filled 2019-08-23 (×4): qty 50

## 2019-08-23 MED ORDER — SODIUM CHLORIDE 0.9 % IV SOLN
3.0000 g | Freq: Four times a day (QID) | INTRAVENOUS | Status: DC
  Administered 2019-08-24 – 2019-08-25 (×6): 3 g via INTRAVENOUS
  Filled 2019-08-23 (×4): qty 8
  Filled 2019-08-23: qty 3
  Filled 2019-08-23: qty 8
  Filled 2019-08-23: qty 3
  Filled 2019-08-23 (×2): qty 8

## 2019-08-23 MED ORDER — FLUTICASONE FUROATE 27.5 MCG/SPRAY NA SUSP: 2.0000 | Freq: Every day | NASAL | Status: DC

## 2019-08-23 MED ORDER — DEXAMETHASONE SODIUM PHOSPHATE 10 MG/ML IJ SOLN
INTRAMUSCULAR | Status: AC
  Filled 2019-08-23: qty 1

## 2019-08-23 MED ORDER — MIDAZOLAM HCL 2 MG/2ML IJ SOLN
INTRAMUSCULAR | Status: DC | PRN
  Administered 2019-08-23: 2 mg via INTRAVENOUS

## 2019-08-23 MED ORDER — ROSUVASTATIN CALCIUM 20 MG PO TABS
40.0000 mg | ORAL_TABLET | Freq: Every day | ORAL | Status: DC
  Administered 2019-08-24 – 2019-08-25 (×2): 40 mg via ORAL
  Filled 2019-08-23 (×2): qty 2

## 2019-08-23 MED ORDER — MIDAZOLAM HCL 2 MG/2ML IJ SOLN
INTRAMUSCULAR | Status: AC
  Filled 2019-08-23: qty 2

## 2019-08-23 MED ORDER — FENTANYL CITRATE (PF) 100 MCG/2ML IJ SOLN
INTRAMUSCULAR | Status: DC | PRN
  Administered 2019-08-23 (×2): 50 ug via INTRAVENOUS

## 2019-08-23 MED ORDER — FLUTICASONE PROPIONATE 50 MCG/ACT NA SUSP
2.0000 | Freq: Every day | NASAL | Status: DC
  Administered 2019-08-24 – 2019-08-25 (×2): 2 via NASAL
  Filled 2019-08-23: qty 16

## 2019-08-23 MED ORDER — VANCOMYCIN HCL 1250 MG/250ML IV SOLN
1250.0000 mg | Freq: Two times a day (BID) | INTRAVENOUS | Status: DC
  Administered 2019-08-23 – 2019-08-24 (×2): 1250 mg via INTRAVENOUS
  Filled 2019-08-23 (×3): qty 250

## 2019-08-23 MED ORDER — DEXAMETHASONE SODIUM PHOSPHATE 10 MG/ML IJ SOLN
INTRAMUSCULAR | Status: DC | PRN
  Administered 2019-08-23: 5 mg via INTRAVENOUS

## 2019-08-23 MED ORDER — ONDANSETRON HCL 4 MG/2ML IJ SOLN: 4.0000 mg | Freq: Once | INTRAMUSCULAR | Status: DC | PRN

## 2019-08-23 MED ORDER — SODIUM CHLORIDE 0.9 % IV SOLN: INTRAVENOUS | Status: DC | PRN

## 2019-08-23 MED ORDER — PROPOFOL 10 MG/ML IV BOLUS
INTRAVENOUS | Status: AC
  Filled 2019-08-23: qty 20

## 2019-08-23 MED ORDER — GLYCOPYRROLATE 0.2 MG/ML IJ SOLN
INTRAMUSCULAR | Status: DC | PRN
  Administered 2019-08-23: .2 mg via INTRAVENOUS

## 2019-08-23 MED ORDER — FENTANYL CITRATE (PF) 100 MCG/2ML IJ SOLN
INTRAMUSCULAR | Status: AC
  Filled 2019-08-23: qty 2

## 2019-08-23 MED ORDER — LIDOCAINE HCL 1 % IJ SOLN
INTRAMUSCULAR | Status: DC | PRN
  Administered 2019-08-23: 9.5 mL

## 2019-08-23 MED ORDER — ALBUTEROL SULFATE HFA 108 (90 BASE) MCG/ACT IN AERS: 1.0000 | INHALATION_SPRAY | Freq: Four times a day (QID) | RESPIRATORY_TRACT | Status: DC | PRN

## 2019-08-23 MED ORDER — INSULIN GLARGINE 100 UNIT/ML ~~LOC~~ SOLN
24.0000 [IU] | Freq: Every day | SUBCUTANEOUS | Status: DC
  Administered 2019-08-23: 24 [IU] via SUBCUTANEOUS
  Filled 2019-08-23 (×2): qty 0.24

## 2019-08-23 MED ORDER — GLYCOPYRROLATE 0.2 MG/ML IJ SOLN
INTRAMUSCULAR | Status: AC
  Filled 2019-08-23: qty 1

## 2019-08-23 MED ORDER — BUPIVACAINE HCL 0.5 % IJ SOLN
INTRAMUSCULAR | Status: DC | PRN
  Administered 2019-08-23: 9.5 mL

## 2019-08-23 MED ORDER — PHENYLEPHRINE HCL (PRESSORS) 10 MG/ML IV SOLN
INTRAVENOUS | Status: DC | PRN
  Administered 2019-08-23 (×4): 100 ug via INTRAVENOUS
  Administered 2019-08-23: 200 ug via INTRAVENOUS
  Administered 2019-08-23: 100 ug via INTRAVENOUS

## 2019-08-23 MED ORDER — ONDANSETRON HCL 4 MG/2ML IJ SOLN
INTRAMUSCULAR | Status: DC | PRN
  Administered 2019-08-23: 4 mg via INTRAVENOUS

## 2019-08-23 MED ORDER — GABAPENTIN 300 MG PO CAPS
300.0000 mg | ORAL_CAPSULE | Freq: Every morning | ORAL | Status: DC
  Administered 2019-08-24 (×2): 300 mg via ORAL
  Filled 2019-08-23 (×2): qty 1

## 2019-08-23 MED ORDER — FENTANYL CITRATE (PF) 100 MCG/2ML IJ SOLN: 25.0000 ug | INTRAMUSCULAR | Status: DC | PRN

## 2019-08-23 MED ORDER — VANCOMYCIN HCL 1000 MG IV SOLR
INTRAVENOUS | Status: DC | PRN
  Administered 2019-08-23: 1000 mg

## 2019-08-23 MED ORDER — ALBUTEROL SULFATE (2.5 MG/3ML) 0.083% IN NEBU: 2.5000 mg | INHALATION_SOLUTION | Freq: Four times a day (QID) | RESPIRATORY_TRACT | Status: DC | PRN

## 2019-08-23 MED ORDER — SERTRALINE HCL 50 MG PO TABS
50.0000 mg | ORAL_TABLET | Freq: Every day | ORAL | Status: DC
  Administered 2019-08-24 – 2019-08-25 (×2): 50 mg via ORAL
  Filled 2019-08-23 (×2): qty 1

## 2019-08-23 MED ORDER — OXYCODONE-ACETAMINOPHEN 5-325 MG PO TABS
2.0000 | ORAL_TABLET | ORAL | Status: DC | PRN
  Administered 2019-08-23: 16:00:00 2 via ORAL
  Filled 2019-08-23: qty 2

## 2019-08-23 MED ORDER — PROPOFOL 10 MG/ML IV BOLUS
INTRAVENOUS | Status: DC | PRN
  Administered 2019-08-23: 150 mg via INTRAVENOUS

## 2019-08-23 MED ORDER — ONDANSETRON HCL 4 MG/2ML IJ SOLN
INTRAMUSCULAR | Status: AC
  Filled 2019-08-23: qty 2

## 2019-08-23 MED ORDER — LIDOCAINE HCL (CARDIAC) PF 100 MG/5ML IV SOSY
PREFILLED_SYRINGE | INTRAVENOUS | Status: DC | PRN
  Administered 2019-08-23: 100 mg via INTRAVENOUS

## 2019-08-23 SURGICAL SUPPLY — 65 items
BLADE OSC/SAGITTAL MD 5.5X18 (BLADE) IMPLANT
BLADE OSCILLATING/SAGITTAL (BLADE) ×1
BLADE SW THK.38XMED LNG THN (BLADE) IMPLANT
BNDG COHESIVE 4X5 TAN STRL (GAUZE/BANDAGES/DRESSINGS) ×2 IMPLANT
BNDG COHESIVE 6X5 TAN STRL LF (GAUZE/BANDAGES/DRESSINGS) ×2 IMPLANT
BNDG CONFORM 2 STRL LF (GAUZE/BANDAGES/DRESSINGS) ×2 IMPLANT
BNDG CONFORM 3 STRL LF (GAUZE/BANDAGES/DRESSINGS) ×2 IMPLANT
BNDG ELASTIC 4X5.8 VLCR STR LF (GAUZE/BANDAGES/DRESSINGS) ×2 IMPLANT
BNDG ESMARK 4X12 TAN STRL LF (GAUZE/BANDAGES/DRESSINGS) ×2 IMPLANT
BNDG GAUZE 4.5X4.1 6PLY STRL (MISCELLANEOUS) ×2 IMPLANT
BOOT STEPPER DURA MED (SOFTGOODS) ×1 IMPLANT
CANISTER SUCT 1200ML W/VALVE (MISCELLANEOUS) ×2 IMPLANT
CANISTER SUCT 3000ML PPV (MISCELLANEOUS) ×2 IMPLANT
COVER WAND RF STERILE (DRAPES) ×2 IMPLANT
CUFF TOURN SGL QUICK 12 (TOURNIQUET CUFF) IMPLANT
CUFF TOURN SGL QUICK 18X4 (TOURNIQUET CUFF) ×1 IMPLANT
DRAPE FLUOR MINI C-ARM 54X84 (DRAPES) ×1 IMPLANT
DRAPE XRAY CASSETTE 23X24 (DRAPES) IMPLANT
DRSG MEPILEX FLEX 3X3 (GAUZE/BANDAGES/DRESSINGS) IMPLANT
DURAPREP 26ML APPLICATOR (WOUND CARE) ×2 IMPLANT
ELECT REM PT RETURN 9FT ADLT (ELECTROSURGICAL) ×2
ELECTRODE REM PT RTRN 9FT ADLT (ELECTROSURGICAL) ×1 IMPLANT
GAUZE PACKING 1/4 X5 YD (GAUZE/BANDAGES/DRESSINGS) ×1 IMPLANT
GAUZE PACKING IODOFORM 1X5 (PACKING) ×3 IMPLANT
GAUZE SPONGE 4X4 12PLY STRL (GAUZE/BANDAGES/DRESSINGS) ×2 IMPLANT
GAUZE XEROFORM 1X8 LF (GAUZE/BANDAGES/DRESSINGS) ×2 IMPLANT
GLOVE BIO SURGEON STRL SZ7.5 (GLOVE) ×2 IMPLANT
GLOVE INDICATOR 8.0 STRL GRN (GLOVE) ×2 IMPLANT
GOWN STRL REUS W/ TWL LRG LVL3 (GOWN DISPOSABLE) ×2 IMPLANT
GOWN STRL REUS W/TWL LRG LVL3 (GOWN DISPOSABLE) ×2
GOWN STRL REUS W/TWL MED LVL3 (GOWN DISPOSABLE) ×4 IMPLANT
HANDPIECE VERSAJET DEBRIDEMENT (MISCELLANEOUS) IMPLANT
IV NS 1000ML (IV SOLUTION) ×1
IV NS 1000ML BAXH (IV SOLUTION) ×1 IMPLANT
KIT STIMULAN RAPID CURE 5CC (Orthopedic Implant) ×1 IMPLANT
KIT TURNOVER KIT A (KITS) ×2 IMPLANT
LABEL OR SOLS (LABEL) ×2 IMPLANT
NDL FILTER BLUNT 18X1 1/2 (NEEDLE) ×1 IMPLANT
NDL HYPO 25X1 1.5 SAFETY (NEEDLE) ×1 IMPLANT
NEEDLE FILTER BLUNT 18X 1/2SAF (NEEDLE) ×1
NEEDLE FILTER BLUNT 18X1 1/2 (NEEDLE) ×1 IMPLANT
NEEDLE HYPO 25X1 1.5 SAFETY (NEEDLE) ×2 IMPLANT
NS IRRIG 500ML POUR BTL (IV SOLUTION) ×2 IMPLANT
PACK EXTREMITY (MISCELLANEOUS) ×2 IMPLANT
PAD ABD DERMACEA PRESS 5X9 (GAUZE/BANDAGES/DRESSINGS) ×3 IMPLANT
PULSAVAC PLUS IRRIG FAN TIP (DISPOSABLE) ×2
RASP SM TEAR CROSS CUT (RASP) IMPLANT
SHIELD FULL FACE ANTIFOG 7M (MISCELLANEOUS) ×2 IMPLANT
SOL .9 NS 3000ML IRR  AL (IV SOLUTION) ×1
SOL .9 NS 3000ML IRR UROMATIC (IV SOLUTION) ×1 IMPLANT
SOL PREP PVP 2OZ (MISCELLANEOUS) ×2
SOLUTION PREP PVP 2OZ (MISCELLANEOUS) ×1 IMPLANT
STOCKINETTE IMPERVIOUS 9X36 MD (GAUZE/BANDAGES/DRESSINGS) ×2 IMPLANT
SUT ETHILON 2 0 FS 18 (SUTURE) ×2 IMPLANT
SUT ETHILON 3-0 FS-10 30 BLK (SUTURE) ×4
SUT ETHILON 4-0 (SUTURE) ×2
SUT ETHILON 4-0 FS2 18XMFL BLK (SUTURE) ×2
SUT VIC AB 3-0 SH 27 (SUTURE) ×1
SUT VIC AB 3-0 SH 27X BRD (SUTURE) ×1 IMPLANT
SUT VIC AB 4-0 FS2 27 (SUTURE) ×2 IMPLANT
SUTURE EHLN 3-0 FS-10 30 BLK (SUTURE) IMPLANT
SUTURE ETHLN 4-0 FS2 18XMF BLK (SUTURE) ×1 IMPLANT
SWAB CULTURE AMIES ANAERIB BLU (MISCELLANEOUS) IMPLANT
SYR 10ML LL (SYRINGE) ×4 IMPLANT
TIP FAN IRRIG PULSAVAC PLUS (DISPOSABLE) ×1 IMPLANT

## 2019-08-23 NOTE — Op Note (Signed)
Operative note   Surgeon:Gokul Waybright Armed forces logistics/support/administrative officer: None    Preop diagnosis: Osteomyelitis right first metatarsal 2.  Equinus deformity right lower extremity    Postop diagnosis: Same    Procedure: 1.  Excision distal first metatarsal 2.  Percutaneous tendo Achilles lengthening right lower leg    EBL: Minimal    Anesthesia:local and general    Hemostasis: Mid calf tourniquet inflated to 200 mmHg for approximately 20 minutes    Specimen: Bone for pathology and bone for culture    Complications: Nonesupine    Operative indications:Jared Castillo is an 69 y.o. that presents today for surgical intervention.  The risks/benefits/alternatives/complications have been discussed and consent has been given.    Procedure:  Patient was brought into the OR and placed on the operating table in thesupine position. After anesthesia was obtained theright lower extremity was prepped and draped in usual sterile fashion.  Attention was directed to the posterior aspect of the Achilles tendon where at 1, 3, and 5 cm proximal to its insertion 3 percutaneous stab incisions were made into the Achilles tendon.  2 medial and 1 lateral incisions were made.  Good release of the Achilles was noted.  This was then closed with a 4-0 nylon.  Attention was directed to the dorsal aspect of the first metatarsal where longitudinal incision was made to the distal aspect.  Full-thickness incision was taken down to the bone.  A through and through osteotomy was created at about the midshaft level.  The distal first metatarsal was then excised and removed from the surgical field in toto.  A sample of the distal necrotic bone was sent for culture.  The remainder was sent for pathological examination with the proximal margin inked.  The wound was then flushed with copious amounts of irrigation.  The residual shaft of the metatarsal was then infiltrated with stimulant and calcium sulfate impregnated with vancomycin.  Final flushing  of the wound was performed.  All bleeders were Bovie cauterized.  At this time a fillet flap was then created to the distal aspect for final reanastomosis.  A fair amount of redundant skin was noted.  After the final fillet flap was created this was then closed with a nylon suture.  The plantar ulceration was packed with iodoform packing.  A bulky sterile dressing was applied to the foot.    Patient tolerated the procedure and anesthesia well.  Was transported from the OR to the PACU with all vital signs stable and vascular status intact.  Will return to the floor upon discharge from PACU.

## 2019-08-23 NOTE — Progress Notes (Deleted)
PHARMACY NOTE:  ANTIMICROBIAL RENAL DOSAGE ADJUSTMENT  Current antimicrobial regimen includes a mismatch between antimicrobial dosage and estimated renal function.  As per policy approved by the Pharmacy & Therapeutics and Medical Executive Committees, the antimicrobial dosage will be adjusted accordingly.  Current antimicrobial dosage:  Levofloxacin 500mg  IV every 24 hours  Indication: CAP  Renal Function:  Estimated Creatinine Clearance: 97 mL/min (by C-G formula based on SCr of 0.75 mg/dL).  Antimicrobial dosage has been changed to:  Levofloxacin 250mg  every 24 hours   Additional comments:  Thank you for allowing pharmacy to be a part of this patient's care.  , PharmD, BCPS Clinical Pharmacist 08/23/2019 12:48 PM

## 2019-08-23 NOTE — Progress Notes (Signed)
Unable to give G2 due to lack of product, AC aware but could not find any in cafeteria.  1A and 2C did not have any on either unit.

## 2019-08-23 NOTE — Anesthesia Preprocedure Evaluation (Signed)
Anesthesia Evaluation  Patient identified by MRN, date of birth, ID band Patient awake    Reviewed: Allergy & Precautions, NPO status , Patient's Chart, lab work & pertinent test results  History of Anesthesia Complications Negative for: history of anesthetic complications  Airway Mallampati: II  TM Distance: >3 FB Neck ROM: Full    Dental  (+) Edentulous Upper, Edentulous Lower   Pulmonary neg sleep apnea, neg COPD, former smoker,    breath sounds clear to auscultation- rhonchi (-) wheezing      Cardiovascular hypertension, Pt. on medications (-) CAD, (-) Past MI, (-) Cardiac Stents and (-) CABG  Rhythm:Regular Rate:Normal - Systolic murmurs and - Diastolic murmurs    Neuro/Psych neg Seizures negative neurological ROS  negative psych ROS   GI/Hepatic Neg liver ROS, GERD  ,  Endo/Other  diabetes, Insulin Dependent  Renal/GU negative Renal ROS     Musculoskeletal  (+) Arthritis ,   Abdominal (+) - obese,   Peds  Hematology negative hematology ROS (+)   Anesthesia Other Findings Past Medical History: No date: Chronic kidney disease     Comment:  stones No date: Diabetes mellitus without complication (HCC) No date: GERD (gastroesophageal reflux disease) No date: Hypertension 12: Shingles rash     Comment:  dx yesterday 03/21/11   Reproductive/Obstetrics                             Anesthesia Physical Anesthesia Plan  ASA: III  Anesthesia Plan: General   Post-op Pain Management:    Induction: Intravenous  PONV Risk Score and Plan: 1 and Ondansetron and Treatment may vary due to age or medical condition  Airway Management Planned: LMA  Additional Equipment:   Intra-op Plan:   Post-operative Plan:   Informed Consent: I have reviewed the patients History and Physical, chart, labs and discussed the procedure including the risks, benefits and alternatives for the proposed  anesthesia with the patient or authorized representative who has indicated his/her understanding and acceptance.     Dental advisory given  Plan Discussed with: CRNA and Anesthesiologist  Anesthesia Plan Comments:         Anesthesia Quick Evaluation

## 2019-08-23 NOTE — Progress Notes (Addendum)
Inpatient Diabetes Program Recommendations  AACE/ADA: New Consensus Statement on Inpatient Glycemic Control (2015)  Target Ranges:  Prepandial:   less than 140 mg/dL      Peak postprandial:   less than 180 mg/dL (1-2 hours)      Critically ill patients:  140 - 180 mg/dL   Lab Results  Component Value Date   GLUCAP 229 (H) 08/23/2019    Review of Glycemic Control Results for BAYNE, FOSNAUGH (MRN [id number]) as of 08/23/2019 11:23  Ref. Range 08/22/2019 19:54 08/23/2019 07:41 08/23/2019 09:05 08/23/2019 10:56  Glucose-Capillary Latest Ref Range: 70 - 99 mg/dL 051 (H) 833 (H) 582 (H) 229 (H)   Diabetes history: DM 2 Outpatient Diabetes medications:  Addendum 1445: Spoke with patient and he verifies that he take NPH 18 units in the AM and NPH 20 units in the PM-added to medication reconciliation Current orders for Inpatient glycemic control:  Novolog moderate tid with meals and HS Inpatient Diabetes Program Recommendations:    A1C pending.  Consider adding Lantus 18 units daily.  Called and spoke with patient- He verified that he takes NPH at home bid.  Notified MD.   Thanks,  Beryl Meager, RN, BC-ADM Inpatient Diabetes Coordinator Pager [phone] (8a-5p)

## 2019-08-23 NOTE — Anesthesia Procedure Notes (Signed)
Procedure Name: LMA Insertion Date/Time: 08/23/2019 9:29 AM Performed by: Irving Burton, CRNA Pre-anesthesia Checklist: Patient identified, Emergency Drugs available, Suction available and Patient being monitored Patient Re-evaluated:Patient Re-evaluated prior to induction Oxygen Delivery Method: Circle system utilized Preoxygenation: Pre-oxygenation with 100% oxygen Induction Type: IV induction Ventilation: Mask ventilation without difficulty LMA: LMA inserted LMA Size: 4.5 Number of attempts: 1 Placement Confirmation: positive ETCO2 and breath sounds checked- equal and bilateral Tube secured with: Tape Dental Injury: Teeth and Oropharynx as per pre-operative assessment

## 2019-08-23 NOTE — Anesthesia Postprocedure Evaluation (Signed)
Anesthesia Post Note  Patient: Jared Castillo  Procedure(s) Performed: RIGHT FOOT IRRIGATION AND DEBRIDEMENT, RIGHT ACHILLES TENDON LENGTHENING,EXCISION RIGHT FIRST METATARSAL (Right )  Patient location during evaluation: PACU Anesthesia Type: General Level of consciousness: awake and alert and oriented Pain management: pain level controlled Vital Signs Assessment: post-procedure vital signs reviewed and stable Respiratory status: spontaneous breathing, nonlabored ventilation and respiratory function stable Cardiovascular status: blood pressure returned to baseline and stable Postop Assessment: no signs of nausea or vomiting Anesthetic complications: no     Last Vitals:  Vitals:   08/23/19 1100 08/23/19 1115  BP: 115/77 110/68  Pulse: 70 63  Resp: 17 15  Temp:    SpO2: 95% 95%    Last Pain:  Vitals:   08/23/19 1115  TempSrc:   PainSc: 0-No pain                 Waverly Chavarria
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
## 2019-08-23 NOTE — Progress Notes (Signed)
Pharmacy Antibiotic Note  Jared Castillo is a 69 y.o. male admitted on 08/22/2019. Pharmacy has been consulted for vancomycin and Zosyn dosing for cellulitis.  Plan: Vanc 2 g IV x 1 followed by 1250 mg IV q12h   Zosyn 3.375 g IV q8h extended infusion  Height: 6' (182.9 cm) Weight: 93 kg (205 lb 0.4 oz) IBW/kg (Calculated) : 77.6  Temp (24hrs), Avg:98 F (36.7 C), Min:97.5 F (36.4 C), Max:98.5 F (36.9 C)  Recent Labs  Lab 08/22/19 1847 08/23/19 0542  WBC 7.5 8.3  CREATININE 0.95 0.75    Estimated Creatinine Clearance: 97 mL/min (by C-G formula based on SCr of 0.75 mg/dL).    No Known Allergies  Antimicrobials this admission: Vancomycin 5/20 >> Zosyn 5/20 >>  Dose adjustments this admission: NA  Microbiology results:   Thank you for allowing pharmacy to be a part of this patient's care.  Ronnald Ramp, PharmD 08/23/2019 1:36 PM
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
## 2019-08-23 NOTE — Transfer of Care (Signed)
Immediate Anesthesia Transfer of Care Note  Patient: Jared Castillo  Procedure(s) Performed: RIGHT FOOT IRRIGATION AND DEBRIDEMENT, RIGHT ACHILLES TENDON LENGTHENING,EXCISION RIGHT FIRST METATARSAL (Right )  Patient Location: PACU  Anesthesia Type:General  Level of Consciousness: awake, alert  and oriented  Airway & Oxygen Therapy: Patient connected to face mask oxygen  Post-op Assessment: Post -op Vital signs reviewed and stable  Post vital signs: Reviewed and stable  Last Vitals:  Vitals Value Taken Time  BP 129/76 08/23/19 1046  Temp    Pulse 67 08/23/19 1047  Resp 18 08/23/19 1047  SpO2 100 % 08/23/19 1047  Vitals shown include unvalidated device data.  Last Pain:  Vitals:   08/23/19 0915  TempSrc: Tympanic  PainSc: 8          Complications: No apparent anesthesia complications

## 2019-08-23 NOTE — H&P (Addendum)
HISTORY AND PHYSICAL INTERVAL NOTE:  08/23/2019  8:24 AM  Jared Castillo  has presented today for surgery, with the diagnosis of Right Foot Wound.  The various methods of treatment have been discussed with the patient.  No guarantees were given.  After consideration of risks, benefits and other options for treatment, the patient has consented to surgery.  I have reviewed the patients' chart and labs.     D/W pt partial removal of 1st metatarsal and achilles tendon lengthening.  Consent addended.  I have consulted ID for planned IV abx. Gwyneth Revels A

## 2019-08-24 ENCOUNTER — Inpatient Hospital Stay: Payer: Self-pay

## 2019-08-24 DIAGNOSIS — E1140 Type 2 diabetes mellitus with diabetic neuropathy, unspecified: Secondary | ICD-10-CM

## 2019-08-24 LAB — GLUCOSE, CAPILLARY
Glucose-Capillary: 226 mg/dL — ABNORMAL HIGH (ref 70–99)
Glucose-Capillary: 233 mg/dL — ABNORMAL HIGH (ref 70–99)
Glucose-Capillary: 277 mg/dL — ABNORMAL HIGH (ref 70–99)
Glucose-Capillary: 243 mg/dL — ABNORMAL HIGH (ref 70–99)

## 2019-08-24 LAB — HEMOGLOBIN A1C
Hgb A1c MFr Bld: 12.6 % — ABNORMAL HIGH (ref 4.8–5.6)
Mean Plasma Glucose: 315 mg/dL

## 2019-08-24 MED ORDER — INSULIN GLARGINE 100 UNIT/ML ~~LOC~~ SOLN
16.0000 [IU] | Freq: Two times a day (BID) | SUBCUTANEOUS | Status: DC
  Administered 2019-08-24 – 2019-08-25 (×3): 16 [IU] via SUBCUTANEOUS
  Filled 2019-08-24 (×5): qty 0.16

## 2019-08-24 MED ORDER — SENNOSIDES-DOCUSATE SODIUM 8.6-50 MG PO TABS
2.0000 | ORAL_TABLET | Freq: Two times a day (BID) | ORAL | Status: DC
  Administered 2019-08-24 – 2019-08-25 (×3): 2 via ORAL
  Filled 2019-08-24 (×3): qty 2
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
## 2019-08-24 NOTE — Progress Notes (Signed)
Daily Progress Note   Subjective  - 1 Day Post-Op S/P TAL and met head excision.  Objective Vitals:   08/23/19 2200 08/23/19 2329 08/24/19 0010 08/24/19 0800  BP: 99/61 (!) 89/26 110/70 128/67  Pulse: 64 67 64 62  Resp:  19  17  Temp:  98.1 F (36.7 C)  (!) 97.5 F (36.4 C)  TempSrc:    Oral  SpO2:  95%  98%  Weight:      Height:        Physical Exam: Still some erythema dorsal incision and bruising.  No purulence from plantar packing . Removed packing.         Laboratory CBC    Component Value Date/Time   WBC 8.3 08/23/2019 0542   HGB 11.5 (L) 08/23/2019 0542   HGB 14.2 12/25/2013 1933   HCT 33.0 (L) 08/23/2019 0542   HCT 42.2 12/25/2013 1933   PLT 263 08/23/2019 0542   PLT 172 12/25/2013 1933    BMET    Component Value Date/Time   NA 134 (L) 08/23/2019 0542   NA 137 11/29/2012 0902   K 3.6 08/23/2019 0542   K 3.8 11/29/2012 0902   CL 98 08/23/2019 0542   CL 104 11/29/2012 0902   CO2 27 08/23/2019 0542   CO2 28 11/29/2012 0902   GLUCOSE 218 (H) 08/23/2019 0542   GLUCOSE 131 (H) 11/29/2012 0902   BUN 15 08/23/2019 0542   BUN 19 (H) 11/29/2012 0902   CREATININE 0.75 08/23/2019 0542   CREATININE 0.85 11/29/2012 0902   CALCIUM 8.8 (L) 08/23/2019 0542   CALCIUM 8.5 11/29/2012 0902   GFRNONAA >60 08/23/2019 0542   GFRNONAA >60 11/29/2012 0902   GFRAA >60 08/23/2019 0542   GFRAA >60 11/29/2012 0902   Cx growing GPR's  Assessment/Planning: Osteomyelitis s/p TAL and met head excision   Dressing changed.  Awaiting culture results  Appreciate ID input.  Watching dorsal incision  F/u tomorrow. Recommend NWB to foot.  Samara Deist A  08/24/2019, 11:25 AM

## 2019-08-24 NOTE — Evaluation (Signed)
Physical Therapy Evaluation Patient Details Name: Jared Castillo MRN: [id number] DOB: 24-Aug-1950 Today's Date: 08/24/2019   History of Present Illness  69 yo Male presents to hospital with osteomyelitis in right foot; He is s/p debridement with partial removal of 1st metatarsal and achilles lengthening on 08/23/19; Patient is NWB in RLE; PMH significant for DM with neuropathy, CKD, HTN  Clinical Impression  69 yo Male s/p RLE partial removal of 1st metatarsal and achilles lengthening; patient is NWB on RLE. He reports being mostly independent in self care prior to admittance, using SPC as needed with gait. He was working prior to admittance but states that he plans on retiring. Patient lives with his wife who is a Diplomatic Services operational officer who will be available to help care for him. He tolerated evaluation well. He was independent in bed mobility. He transferred sit<>stand from bed/chair with supervision using RW, maintaining NWB on RLE well. Patient able to ambulate 15 feet with RW hopping on LLE with good trunk control and stability, requiring CGA for safety especially with turns. He would benefit from skilled PT intervention to improve strength and safety with ADLs. He does have 5 steps to enter house but exhibits good UE strength as well as good LLE strength so he should be okay to negotiate. He will need RW and shower seat for home use for better safety with ADLs.     Follow Up Recommendations Home health PT    Equipment Recommendations  Rolling walker with 5" wheels;Other (comment)(shower seat)    Recommendations for Other Services       Precautions / Restrictions Precautions Precautions: Fall Required Braces or Orthoses: Other Brace Other Brace: Boot on RLE foot Restrictions Weight Bearing Restrictions: Yes RLE Weight Bearing: Non weight bearing      Mobility  Bed Mobility Overal bed mobility: Independent             General bed mobility comments: no difficulty with  supine<>sit  Transfers Overall transfer level: Needs assistance Equipment used: Rolling walker (2 wheeled) Transfers: Sit to/from Stand Sit to Stand: Supervision;Min guard         General transfer comment: x1 rep from bed x2 reps from bedside chair; required cues to advance RLE forward for maintaining NWB and cues for hand placement for safety;  Ambulation/Gait Ambulation/Gait assistance: Min guard Gait Distance (Feet): 15 Feet Assistive device: Rolling walker (2 wheeled)   Gait velocity: decreased   General Gait Details: pt able to maintain NWB on RLE well, hopping on LLE with good trunk control and walker placement; Does require CGA for safety especially with turns;  Stairs            Wheelchair Mobility    Modified Rankin (Stroke Patients Only)       Balance Overall balance assessment: Needs assistance Sitting-balance support: Feet supported Sitting balance-Leahy Scale: Good     Standing balance support: Bilateral upper extremity supported Standing balance-Leahy Scale: Fair Standing balance comment: able to maintain RLE NWB well with RW assistance;                             Pertinent Vitals/Pain Pain Assessment: 0-10 Pain Score: 8  Pain Location: right foot Pain Descriptors / Indicators: Aching;Sore Pain Intervention(s): Limited activity within patient's tolerance;Monitored during session    [place] expects to be discharged to:: Private residence Living Arrangements: Spouse/significant other Available Help at Discharge: Family;Available 24 hours/day Type of Home: Mobile home  Home Access: Stairs to enter Entrance Stairs-Rails: Right;Left;Can reach both Entrance Stairs-Number of Steps: 5 Home Layout: One level Home Equipment: Cane - single point;Bedside commode      Prior Function Level of Independence: Independent with assistive device(s)         Comments: would use SPC occasionally at home but reports being  independent in basic ADLs;     Hand Dominance        Extremity/Trunk Assessment   Upper Extremity Assessment Upper Extremity Assessment: Overall WFL for tasks assessed    Lower Extremity Assessment Lower Extremity Assessment: RLE deficits/detail;LLE deficits/detail RLE Deficits / Details: not formall assessed due to post op; hip at least 3/5 with patient being able to lift leg out of bed without difficulty; RLE Sensation: history of peripheral neuropathy LLE Deficits / Details: grossly 5/5 LLE Sensation: history of peripheral neuropathy    Cervical / Trunk Assessment Cervical / Trunk Assessment: Normal  Communication   Communication: No difficulties  Cognition Arousal/Alertness: Awake/alert Behavior During Therapy: WFL for tasks assessed/performed Overall Cognitive Status: Within Functional Limits for tasks assessed                                 General Comments: oriented x4      General Comments      Exercises     Assessment/Plan    PT Assessment Patient needs continued PT services  PT Problem List Decreased strength;Decreased mobility;Decreased safety awareness;Decreased activity tolerance;Decreased balance;Pain       PT Treatment Interventions DME instruction;Gait training;Stair training;Functional mobility training;Therapeutic activities;Therapeutic exercise;Balance training;Neuromuscular re-education;Patient/family education    PT Goals (Current goals can be found in the Care Plan section)  Acute Rehab PT Goals Patient Stated Goal: to go home, get better PT Goal Formulation: With patient Time For Goal Achievement: 09/07/19 Potential to Achieve Goals: Good    Frequency Min 2X/week   Barriers to discharge Inaccessible home environment has 5 steps to enter with B rails;    Co-evaluation               AM-PAC PT "6 Clicks" Mobility  Outcome Measure Help needed turning from your back to your side while in a flat bed without using  bedrails?: None Help needed moving from lying on your back to sitting on the side of a flat bed without using bedrails?: A Little Help needed moving to and from a bed to a chair (including a wheelchair)?: None Help needed standing up from a chair using your arms (e.g., wheelchair or bedside chair)?: A Little Help needed to walk in hospital room?: A Little Help needed climbing 3-5 steps with a railing? : A Little 6 Click Score: 20    End of Session Equipment Utilized During Treatment: Gait belt Activity Tolerance: Patient tolerated treatment well Patient left: in chair;with call bell/phone within reach;with chair alarm set Nurse Communication: Mobility status PT Visit Diagnosis: Unsteadiness on feet (R26.81);Difficulty in walking, not elsewhere classified (R26.2);Muscle weakness (generalized) (M62.81)    Time: 1010-1040 PT Time Calculation (min) (ACUTE ONLY): 30 min   Charges:   PT Evaluation $PT Eval Low Complexity: 1 Low           Ramere Downs PT, DPT 08/24/2019, 1:10 PM

## 2019-08-24 NOTE — Progress Notes (Signed)
ID Discussed with Dr.Fowler- he will need IV antibiotics on discharge as osteomyelitis Culture has not finalized. Hopefully tomorrow we can decide on home iV Will get PICC

## 2019-08-24 NOTE — Progress Notes (Signed)
Spoke with RN re PICC line to be placed 5/23.

## 2019-08-25 DIAGNOSIS — M86171 Other acute osteomyelitis, right ankle and foot: Secondary | ICD-10-CM

## 2019-08-25 LAB — CREATININE, SERUM
Creatinine, Ser: 0.75 mg/dL (ref 0.61–1.24)
GFR calc Af Amer: >60 mL/min (ref >60)
GFR calc non Af Amer: >60 mL/min (ref >60)

## 2019-08-25 LAB — AEROBIC CULTURE W GRAM STAIN (SUPERFICIAL SPECIMEN): Culture: NORMAL

## 2019-08-25 LAB — GLUCOSE, CAPILLARY
Glucose-Capillary: 169 mg/dL — ABNORMAL HIGH (ref 70–99)
Glucose-Capillary: 213 mg/dL — ABNORMAL HIGH (ref 70–99)

## 2019-08-25 MED ORDER — CHLORHEXIDINE GLUCONATE CLOTH 2 % EX PADS
6.0000 | MEDICATED_PAD | Freq: Every day | CUTANEOUS | Status: DC
  Administered 2019-08-25: 6 via TOPICAL

## 2019-08-25 MED ORDER — HYDROCODONE-ACETAMINOPHEN 5-325 MG PO TABS: 1.0000 | ORAL_TABLET | Freq: Four times a day (QID) | ORAL | 0 refills | Status: DC | PRN

## 2019-08-25 MED ORDER — SODIUM CHLORIDE 0.9 % IV SOLN
2.0000 g | Freq: Every day | INTRAVENOUS | Status: DC
  Administered 2019-08-25: 2 g via INTRAVENOUS
  Filled 2019-08-25: qty 20
  Filled 2019-08-25: qty 2

## 2019-08-25 MED ORDER — CEFTRIAXONE IV (FOR PTA / DISCHARGE USE ONLY)
2.0000 g | INTRAVENOUS | 0 refills | Status: AC
Start: 2019-08-26 — End: 2019-09-21

## 2019-08-25 MED ORDER — SODIUM CHLORIDE 0.9% FLUSH: 10.0000 mL | INTRAVENOUS | Status: DC | PRN

## 2019-08-25 MED ORDER — SODIUM CHLORIDE 0.9% FLUSH
10.0000 mL | Freq: Two times a day (BID) | INTRAVENOUS | Status: DC
  Administered 2019-08-25: 30 mL
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
## 2019-08-25 NOTE — TOC Transition Note (Signed)
Transition of Care [facility]) - CM/SW Discharge Note   Patient Details  Name: Jared Castillo MRN: [id number] Date of Birth: Jun 14, 1950  Transition of Care [facility]) CM/SW Contact:  Maud Deed, LCSW Phone Number: 08/25/2019, 11:56 AM   Clinical Narrative:    CSW was consulted by MD for pt's [facility] IV antibiotics and PT. Pt is medically stable to discharge per MD. CSW contacted [facility] to notify of pt's home IV antibiotic needs. CSW contacted [place] with [facility] with HH RN and PT referral. [facility] PT and RN.   Final next level of care: Home w Home Health Services Barriers to Discharge: No Barriers Identified   Patient Goals and CMS Choice        Discharge Placement                Patient to be transferred to facility by: Spouse Name of family member notified: Kim Patient and family notified of of transfer: 08/25/19  Discharge Plan and Services                DME Arranged: IV pump/equipment DME Agency: Other - Comment([facility]) Date DME Agency Contacted: 08/25/19 Time DME Agency Contacted: 1155 Representative spoke with at DME Agency: [facility] HH Arranged: RN, PT [facility] Agency: [facility] Date HH Agency Contacted: 08/25/19 Time HH Agency Contacted: 1155 Representative spoke with at [facility] Agency: [place]  Social Determinants of Health (SDOH) Interventions     Readmission Risk Interventions No flowsheet data found.

## 2019-08-25 NOTE — Treatment Plan (Signed)
Diagnosis: Left foot infection Baseline Creatinine 0.75  Group B streptococcus in bone culture  No Known Allergies  [facility]AT Orders Discharge antibiotics:ceftriaxone 2 gram IV every 24 hours until  09/20/19 - may need more  [facility] Care Per Protocol:  Labs weekly Tuesday while on IV antibiotics: _X_ CBC with differential  _X_ CMP  _X_ ESR    _X_ Please leave PIC in place until doctor has  been notified  Fax weekly labs to [facility]  [phone]  Clinic Follow Up Appt:3 weeks  Call [phone] to make appt

## 2019-08-25 NOTE — Progress Notes (Signed)
Peripherally Inserted Central Catheter Placement  The IV Nurse has discussed with the patient and/or persons authorized to consent for the patient, the purpose of this procedure and the potential benefits and risks involved with this procedure.  The benefits include less needle sticks, lab draws from the catheter, and the patient may be discharged home with the catheter. Risks include, but not limited to, infection, bleeding, blood clot (thrombus formation), and puncture of an artery; nerve damage and irregular heartbeat and possibility to perform a PICC exchange if needed/ordered by physician.  Alternatives to this procedure were also discussed.  Bard Power PICC patient education guide, fact sheet on infection prevention and patient information card has been provided to patient /or left at bedside.    PICC Placement Documentation  PICC Single Lumen 08/25/19 PICC Right Brachial 39 cm 0 cm (Active)  Indication for Insertion or Continuance of Line Home intravenous therapies (PICC only) 08/25/19 0939  Exposed Catheter (cm) 0 cm 08/25/19 8768  Site Assessment Clean;Dry;Intact 08/25/19 0939  Line Status Flushed;Saline locked;Blood return noted 08/25/19 0939  Dressing Type Transparent 08/25/19 0939  Dressing Status Clean;Dry;Intact;Antimicrobial disc in place 08/25/19 0939  Safety Lock Not Applicable 08/25/19 0939  Line Care Tubing changed;Connections checked and tightened 08/25/19 0939  Line Adjustment (NICU/IV Team Only) No 08/25/19 0939  Dressing Intervention New dressing 08/25/19 0939  Dressing Change Due 09/01/19 08/25/19 0939       Elliot Dally 08/25/2019, 9:40 AM

## 2019-08-25 NOTE — Progress Notes (Addendum)
ID Pt doing very well No complaints Patient Vitals for the past 24 hrs:  BP Temp Temp src Pulse Resp SpO2  08/25/19 0802 118/73 (!) 97.5 F (36.4 C) Oral (!) 52 -- 98 %  08/24/19 2328 (!) 105/56 97.7 F (36.5 C) Oral 68 16 97 %  08/24/19 2140 (!) 110/58 -- -- 61 -- --  08/24/19 1702 124/70 97.6 F (36.4 C) Oral 61 18 98 %    Rt foot Surgical site looks good with no necrosis or discharge      Rt PICC Labs CBC Latest Ref Rng & Units 08/23/2019 08/22/2019 01/16/2017  WBC 4.0 - 10.5 K/uL 8.3 7.5 8.0  Hemoglobin 13.0 - 17.0 g/dL 11.5(L) 12.1(L) 15.7  Hematocrit 39.0 - 52.0 % 33.0(L) 33.9(L) 45.6  Platelets 150 - 400 K/uL 263 277 236    CMP Latest Ref Rng & Units 08/25/2019 08/23/2019 08/22/2019  Glucose 70 - 99 mg/dL - 314(H) 702(O)  BUN 8 - 23 mg/dL - 15 14  Creatinine 3.78 - 1.24 mg/dL 5.88 5.02 7.74  Sodium 135 - 145 mmol/L - 134(L) 137  Potassium 3.5 - 5.1 mmol/L - 3.6 3.6  Chloride 98 - 111 mmol/L - 98 98  CO2 22 - 32 mmol/L - 27 28  Calcium 8.9 - 10.3 mg/dL - 8.8(L) 9.3  Total Protein 6.5 - 8.1 g/dL - - 7.6  Total Bilirubin 0.3 - 1.2 mg/dL - - 0.5  Alkaline Phos 38 - 126 U/L - - 85  AST 15 - 41 U/L - - 14(L)  ALT 0 - 44 U/L - - 18    Impression/recommendation Rt foot infection with osteomyelitis  Diabetes S/p  first metatarsal head excision. Pathology pending Culture group B streptococcus On unasyn Change to ceftriaxone on discharge Will need for 4 weeks- 6/18/21see treatment note? ?Hyperlipidemia on rosuvastatin  HTN on metoprolol  Follow up with me in 3 weeks  Discussed the management with patient and hospitalist Discussed with Dr.Fowler

## 2019-08-25 NOTE — Discharge Instructions (Signed)
1.  Follow-up with PCP in 1 week. 2.  Weekly CBC, CMP and ESR while on antibiotics.  This can be ordered in primary care physician's office. 3.  Follow-up with infectious disease in 1 week. Fax result to infectious disease, Dr. Gwenevere Ghazi office.  4.  Keep PICC line until discussed with ID after completion of antibiotics.

## 2019-08-25 NOTE — Consult Note (Signed)
PHARMACY CONSULT NOTE FOR:  OUTPATIENT  PARENTERAL ANTIBIOTIC THERAPY (OPAT)  Indication: Group B streptococcus in bone culture Regimen: Ceftriaxone 2 gram IV every 24 hours  End date: 09/20/19 (tentative)  IV antibiotic discharge orders are pended. To discharging provider:  please sign these orders via discharge navigator,  Select New Orders & click on the button choice - Manage This Unsigned Work.     Thank you for allowing pharmacy to be a part of this patient's care.  Tressie Ellis  Pharmacy Resident 08/25/2019, 11:24 AM

## 2019-08-25 NOTE — Progress Notes (Signed)
Daily Progress Note   Subjective  - 2 Days Post-Op  F/u right 1st metatarsal excision and TAL.  Some pain PICC placed  Objective Vitals:   08/24/19 1702 08/24/19 2140 08/24/19 2328 08/25/19 0802  BP: 124/70 (!) 110/58 (!) 105/56 118/73  Pulse: 61 61 68 (!) 52  Resp: 18  16   Temp: 97.6 F (36.4 C)  97.7 F (36.5 C) (!) 97.5 F (36.4 C)  TempSrc: Oral  Oral Oral  SpO2: 98%  97% 98%  Weight:      Height:        Physical Exam: Wound stable.  No purulence.  Dorsal site with bruising and erythematous skin but stable and improved.       Laboratory CBC    Component Value Date/Time   WBC 8.3 08/23/2019 0542   HGB 11.5 (L) 08/23/2019 0542   HGB 14.2 12/25/2013 1933   HCT 33.0 (L) 08/23/2019 0542   HCT 42.2 12/25/2013 1933   PLT 263 08/23/2019 0542   PLT 172 12/25/2013 1933    BMET    Component Value Date/Time   NA 134 (L) 08/23/2019 0542   NA 137 11/29/2012 0902   K 3.6 08/23/2019 0542   K 3.8 11/29/2012 0902   CL 98 08/23/2019 0542   CL 104 11/29/2012 0902   CO2 27 08/23/2019 0542   CO2 28 11/29/2012 0902   GLUCOSE 218 (H) 08/23/2019 0542   GLUCOSE 131 (H) 11/29/2012 0902   BUN 15 08/23/2019 0542   BUN 19 (H) 11/29/2012 0902   CREATININE 0.75 08/25/2019 0737   CREATININE 0.85 11/29/2012 0902   CALCIUM 8.8 (L) 08/23/2019 0542   CALCIUM 8.5 11/29/2012 0902   GFRNONAA >60 08/25/2019 0737   GFRNONAA >60 11/29/2012 0902   GFRAA >60 08/25/2019 0737   GFRAA >60 11/29/2012 0902   Culture with group b strep so far. GPR's as well. Assessment/Planning: Osteomyelitis s/p debridement right 1st ray.   Wound is stable.  OK from podiatry to d/c.  Dressings daily:  Paint incision with betadine.  Cover with bulky padded dry dressing. Maintain Cam Boot.  F/u with podiatry in 1 week.  Gwyneth Revels A  08/25/2019, 10:05 AM

## 2019-08-27 LAB — SURGICAL PATHOLOGY

## 2019-08-28 LAB — AEROBIC/ANAEROBIC CULTURE W GRAM STAIN (SURGICAL/DEEP WOUND)

## 2019-09-05 ENCOUNTER — Other Ambulatory Visit: Payer: Self-pay

## 2019-09-05 ENCOUNTER — Ambulatory Visit: Payer: Managed Care, Other (non HMO) | Attending: Infectious Diseases | Admitting: Infectious Diseases

## 2019-09-05 ENCOUNTER — Encounter: Payer: Self-pay | Admitting: Infectious Diseases

## 2019-09-05 VITALS — BP 118/68 | HR 67 | Temp 99.4°F | Resp 16 | Ht 72.0 in | Wt 205.0 lb

## 2019-09-05 DIAGNOSIS — E785 Hyperlipidemia, unspecified: Secondary | ICD-10-CM | POA: Diagnosis not present

## 2019-09-05 DIAGNOSIS — K219 Gastro-esophageal reflux disease without esophagitis: Secondary | ICD-10-CM | POA: Diagnosis not present

## 2019-09-05 DIAGNOSIS — N189 Chronic kidney disease, unspecified: Secondary | ICD-10-CM | POA: Insufficient documentation

## 2019-09-05 DIAGNOSIS — Z885 Allergy status to narcotic agent status: Secondary | ICD-10-CM | POA: Diagnosis not present

## 2019-09-05 DIAGNOSIS — M86171 Other acute osteomyelitis, right ankle and foot: Secondary | ICD-10-CM | POA: Diagnosis not present

## 2019-09-05 DIAGNOSIS — I129 Hypertensive chronic kidney disease with stage 1 through stage 4 chronic kidney disease, or unspecified chronic kidney disease: Secondary | ICD-10-CM | POA: Diagnosis not present

## 2019-09-05 DIAGNOSIS — Z95828 Presence of other vascular implants and grafts: Secondary | ICD-10-CM | POA: Insufficient documentation

## 2019-09-05 DIAGNOSIS — Z79899 Other long term (current) drug therapy: Secondary | ICD-10-CM | POA: Insufficient documentation

## 2019-09-05 DIAGNOSIS — Z87891 Personal history of nicotine dependence: Secondary | ICD-10-CM | POA: Insufficient documentation

## 2019-09-05 DIAGNOSIS — Z79891 Long term (current) use of opiate analgesic: Secondary | ICD-10-CM | POA: Diagnosis not present

## 2019-09-05 DIAGNOSIS — Z794 Long term (current) use of insulin: Secondary | ICD-10-CM | POA: Diagnosis not present

## 2019-09-05 DIAGNOSIS — E1169 Type 2 diabetes mellitus with other specified complication: Secondary | ICD-10-CM | POA: Insufficient documentation

## 2019-09-05 DIAGNOSIS — E1122 Type 2 diabetes mellitus with diabetic chronic kidney disease: Secondary | ICD-10-CM | POA: Insufficient documentation

## 2019-09-05 DIAGNOSIS — B951 Streptococcus, group B, as the cause of diseases classified elsewhere: Secondary | ICD-10-CM | POA: Insufficient documentation

## 2019-09-05 DIAGNOSIS — L089 Local infection of the skin and subcutaneous tissue, unspecified: Secondary | ICD-10-CM | POA: Insufficient documentation

## 2019-09-05 NOTE — Patient Instructions (Signed)
You are here for follow up of rt foot infection with bone infection- you are on IV antibiotic which you will continue until 09/20/19 Your blood work from 09/03/19 is looking good Please follow up with Dr.Fowler

## 2019-09-05 NOTE — Progress Notes (Signed)
NAME: Jared Castillo  DOB: 1950/10/27  MRN: [id number]  Date/Time: 09/05/2019 12:39 PM   Subjective:   ?follow up after recent hospitalization Jared Castillo is a 69 y.o. male with a history of DM, HTN, CKD was at [facility] between 08/22/19 - 08/25/19 for rt foot infection with exposed bone. HE underwent  excision of the distal first metatarsal bone and percutaneous tendon Achilles lengthening of the right lower leg. Culture was group B streptococcus- Bone biopsy was acute osteomyelitis with clear margins.. He was discharged with PICC line and IV ceftriaxone to complete 4 weeks( 09/20/19) He is here for follow up Is doing well Is not weight bearing Wife does Iv antibiotics every day Dressing changed every day Followed up with podiatrist as OP No rash, no diarrhea  Past Medical History:  Diagnosis Date  . Chronic kidney disease    stones  . Diabetes mellitus without complication ([place])   . GERD (gastroesophageal reflux disease)   . Hypertension   . Shingles rash 13   dx yesterday 03/21/11    Past Surgical History:  Procedure Laterality Date  . APPENDECTOMY     child  . IRRIGATION AND DEBRIDEMENT FOOT Right 08/23/2019   Procedure: RIGHT FOOT IRRIGATION AND DEBRIDEMENT, RIGHT ACHILLES TENDON LENGTHENING,EXCISION RIGHT FIRST METATARSAL;  Surgeon: Samara Deist, DPM;  Location: [facility] ORS;  Service: Podiatry;  Laterality: Right;  . TOE AMPUTATION  79   rt great and second  . TOTAL KNEE ARTHROPLASTY  03/30/2011   Procedure: TOTAL KNEE ARTHROPLASTY;  Surgeon: Newt Minion, MD;  Location: [place];  Service: Orthopedics;  Laterality: Right;  Right Total Knee Arthroplastly    Social History   Socioeconomic History  . Marital status: Married    Spouse name: Not on file  . Number of children: Not on file  . Years of education: Not on file  . Highest education level: Not on file  Occupational History  . Not on file  Tobacco Use  . Smoking status: Former Research scientist (life sciences)  . Smokeless tobacco: Never Used    Substance and Sexual Activity  . Alcohol use: Yes    Alcohol/week: 2.0 - 3.0 standard drinks    Types: 2 - 3 Cans of beer per week  . Drug use: No  . Sexual activity: Not on file  Other Topics Concern  . Not on file  Social History Narrative  . Not on file   Social Determinants of Health   Financial Resource Strain:   . Difficulty of Paying Living Expenses:   Food Insecurity:   . Worried About Charity fundraiser in the Last Year:   . Arboriculturist in the Last Year:   Transportation Needs:   . Film/video editor (Medical):   Marland Kitchen Lack of Transportation (Non-Medical):   Physical Activity:   . Days of Exercise per Week:   . Minutes of Exercise per Session:   Stress:   . Feeling of Stress :   Social Connections:   . Frequency of Communication with Friends and Family:   . Frequency of Social Gatherings with Friends and Family:   . Attends Religious Services:   . Active Member of Clubs or Organizations:   . Attends Archivist Meetings:   Marland Kitchen Marital Status:   Intimate Partner Violence:   . Fear of Current or Ex-Partner:   . Emotionally Abused:   Marland Kitchen Physically Abused:   . Sexually Abused:     No family history on file. Allergies  Allergen Reactions  .  Oxycodone-Acetaminophen Other (See Comments)    delusions  . Trazodone Other (See Comments)    Nightmares  ? Current Outpatient Medications  Medication Sig Dispense Refill  . albuterol (VENTOLIN HFA) 108 (90 Base) MCG/ACT inhaler Inhale 1-2 puffs into the lungs every 6 (six) hours as needed for wheezing or shortness of breath.    . cefTRIAXone (ROCEPHIN) IVPB Inject 2 g into the vein daily for 26 days. Indication: Osteomyelitis Last Day of Therapy: 09/20/19 Labs - Once weekly on Tuesday:  CBC/D and CMP and ESR Method of administration: IV Push Method of administration may be changed at the discretion of home infusion pharmacist based upon assessment of the patient and/or caregiver's ability to self-administer  the medication ordered. 26 Units 0  . fluticasone (VERAMYST) 27.5 MCG/SPRAY nasal spray Place 2 sprays into the nose daily.    Marland Kitchen gabapentin (NEURONTIN) 300 MG capsule Take 300 mg by mouth every morning.    Marland Kitchen HYDROcodone-acetaminophen (NORCO/VICODIN) 5-325 MG tablet Take 1 tablet by mouth every 6 (six) hours as needed for moderate pain. 20 tablet 0  . HYDROcodone-acetaminophen (VICODIN) 5-500 MG per tablet Take 1 tablet by mouth every 6 (six) hours as needed. For pain     . insulin NPH Human (NOVOLIN N) 100 UNIT/ML injection Inject 18 Units into the skin daily before breakfast.    . lisinopril-hydrochlorothiazide (ZESTORETIC) 20-25 MG tablet Take 0.5 tablets by mouth daily.    . metFORMIN (GLUCOPHAGE) 500 MG tablet Take 500 mg by mouth 2 (two) times daily with a meal.    . metoprolol tartrate (LOPRESSOR) 25 MG tablet Take 25 mg by mouth 2 (two) times daily.      Marland Kitchen omeprazole (PRILOSEC) 40 MG capsule Take 40 mg by mouth daily.      . rosuvastatin (CRESTOR) 40 MG tablet Take 40 mg by mouth daily.    . sitaGLIPtin (JANUVIA) 100 MG tablet Take 100 mg by mouth daily.    . metoprolol succinate (TOPROL-XL) 50 MG 24 hr tablet Take 50 mg by mouth daily. Take with or immediately following a meal.     No current facility-administered medications for this visit.     Abtx:  Anti-infectives (From admission, onward)   None      REVIEW OF SYSTEMS:  Const: negative fever, negative chills, negative weight loss Eyes: negative diplopia or visual changes, negative eye pain ENT: negative coryza, negative sore throat Resp: negative cough, hemoptysis, dyspnea Cards: negative for chest pain, palpitations, lower extremity edema GU: negative for frequency, dysuria and hematuria GI: Negative for abdominal pain, diarrhea, bleeding, constipation Skin: negative for rash and pruritus Heme: negative for easy bruising and gum/nose bleeding MS: rt foot -does not weight bear directly Neurolo:negative for headaches,  dizziness, vertigo, memory problems  Psych: negative for feelings of anxiety, depression  Endocrine:  diabetes Allergy/Immunology-as above? Objective:  VITALS:  BP 118/68   Pulse 67   Temp 99.4 F (37.4 C)   Resp 16   Ht 6' (1.829 m)   Wt 205 lb (93 kg)   SpO2 98%   BMI 27.80 kg/m  PHYSICAL EXAM:  General: Alert, cooperative, no distress, appears stated age.  Head: Normocephalic, without obvious abnormality, atraumatic. Eyes: Conjunctivae clear, anicteric sclerae. Pupils are equal ENT Nares normal. No drainage or sinus tenderness. Lips, mucosa, and tongue normal. No Thrush Neck: Supple, symmetrical, no adenopathy, thyroid: non tender no carotid bruit and no JVD. Back: No CVA tenderness. Lungs: Clear to auscultation bilaterally. No Wheezing or Rhonchi. No rales.  Heart: Regular rate and rhythm, no murmur, rub or gallop. Abdomen: did not examine Extremities: rt PICC site fine  rt foot dressing removed Surgical wound healing- some superficial skin ischemia at the site of the sutures     08/25/19      Skin: No rashes or lesions. Or bruising Lymph: Cervical, supraclavicular normal. Neurologic: Grossly non-focal Pertinent Labs Lab Results 6/1 ESR 49 WBC 6.2 Cr N  ? 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  Culture group B streptococcus On ceftriaxone until 09/20/19 Follow ESR Surgical site looking okay except for superficial ischemia at the suture site- follow with podiatrist next week  ?Hyperlipidemia on rosuvastatin  HTN on metoprolol  Discussed the management with patient and his wife ? ___________________________________________________ Discussed with patient, requesting provider Note:  This document was prepared using Dragon voice recognition software and may include unintentional dictation errors.

## 2019-09-23 ENCOUNTER — Telehealth: Payer: Self-pay

## 2019-09-23 NOTE — Telephone Encounter (Signed)
Nurse Larita Fife from [facility] called asked for verbal again to allowing them to accept orders from [facility]. Advised what I was instructed to say which was that we have no say over who they want to accept orders from once PICC was D/C. She then stated Dr. Rivka Safer signed orders but the wrong [facility] doctor was on them. I asked her to please send the correct form and she said if we would give the verbal she would not have to draw it up again with corrections and send it to [place]. Complained about having to send me written orders to have for doctor then said medicare makes them do this but verbal is easier.  She then corrected again that she did not have signatures on the orders with the wrong doctor listed and said she would call me later. I sent Milas Kocher emails about this and asked how we can avoid days of this back and forth and explained how it affected [place] Friday.

## 2019-10-11 ENCOUNTER — Encounter: Payer: Self-pay | Admitting: Podiatry

## 2020-10-26 ENCOUNTER — Ambulatory Visit (INDEPENDENT_AMBULATORY_CARE_PROVIDER_SITE_OTHER): Payer: Managed Care, Other (non HMO)

## 2020-10-26 ENCOUNTER — Ambulatory Visit: Payer: Managed Care, Other (non HMO) | Admitting: Orthopedic Surgery

## 2020-10-26 ENCOUNTER — Other Ambulatory Visit: Payer: Self-pay

## 2020-10-26 DIAGNOSIS — M25561 Pain in right knee: Secondary | ICD-10-CM

## 2020-10-26 DIAGNOSIS — G8929 Other chronic pain: Secondary | ICD-10-CM | POA: Diagnosis not present

## 2020-10-26 DIAGNOSIS — Z96651 Presence of right artificial knee joint: Secondary | ICD-10-CM

## 2020-10-27 ENCOUNTER — Encounter: Payer: Self-pay | Admitting: Orthopedic Surgery

## 2020-10-27 NOTE — Progress Notes (Signed)
Office Visit Note   Patient: Jared Castillo           Date of Birth: [birth year]           MRN: [id number] Visit Date: 10/26/2020              Requested by: Verner Mould, MD [address] [place],  [place] [address] PCP: Verner Mould, MD  Chief Complaint  Patient presents with   Right Knee - Pain    Hx TKR 2012      HPI: Patient is a 70 year old gentleman who presents for evaluation for right knee pain.  He had a total knee arthroplasty in 2012 and his knee began hurting several months ago he states the pain is getting worse has pain with ambulation he feels like something feels unstable in his knee.  Assessment & Plan: Visit Diagnoses:  1. Total knee replacement status, right   2. Chronic pain of right knee     Plan: Patient was given a prescription for physical therapy at [facility] for quad and hamstring strengthening.  Discussed that this will be important to provide stability for his knee reevaluate in 2 months.  Follow-Up Instructions: Return in about 2 months (around 12/27/2020).   Ortho Exam  Patient is alert, oriented, no adenopathy, well-dressed, normal affect, normal respiratory effort. Examination patient's right knee there is no redness no cellulitis no effusion no signs of infection varus and valgus stress is stable he has good range of motion from 0 to 120 degrees there is no varus or valgus malalignment with standing.  The patella tracks midline no crepitation with range of motion.  Patient's last hemoglobin A1c last year was 12.6.  Imaging: No results found. No images are attached to the encounter.  Labs: Lab Results  Component Value Date   HGBA1C 12.6 (H) 08/22/2019   REPTSTATUS 08/25/2019 FINAL 08/22/2019   GRAMSTAIN  08/22/2019    RARE WBC PRESENT, PREDOMINANTLY MONONUCLEAR RARE GRAM POSITIVE RODS    CULT  08/22/2019    NORMAL SKIN FLORA Performed at [facility], [address], [place], [place] [address]       Lab Results  Component Value Date   ALBUMIN 3.2 (L) 08/22/2019   ALBUMIN 3.1 (L) 11/29/2012   ALBUMIN 3.6 03/22/2011    No results found for: MG No results found for: VD25OH  No results found for: PREALBUMIN CBC EXTENDED Latest Ref Rng & Units 08/23/2019 08/22/2019 01/16/2017  WBC 4.0 - 10.5 K/uL 8.3 7.5 8.0  RBC 4.22 - 5.81 MIL/uL 3.82(L) 4.06(L) 5.00  HGB 13.0 - 17.0 g/dL 11.5(L) 12.1(L) 15.7  HCT 39.0 - 52.0 % 33.0(L) 33.9(L) 45.6  PLT 150 - 400 K/uL 263 277 236  NEUTROABS 1.7 - 7.7 K/uL - 3.7 -  LYMPHSABS 0.7 - 4.0 K/uL - 2.6 -     There is no height or weight on file to calculate BMI.  Orders:  Orders Placed This Encounter  Procedures   XR Knee 1-2 Views Right   Ambulatory referral to Physical Therapy   No orders of the defined types were placed in this encounter.    Procedures: No procedures performed  Clinical Data: No additional findings.  ROS:  All other systems negative, except as noted in the HPI. Review of Systems  Objective: Vital Signs: There were no vitals taken for this visit.  Specialty Comments:  No specialty comments available.  PMFS History: Patient Active Problem List   Diagnosis Date  Noted   Right foot infection 09/05/2019   Osteomyelitis of ankle or foot, acute, right (HCC) 09/05/2019   Controlled type 2 diabetes mellitus with neuropathy (HCC) 08/22/2019   Essential hypertension 08/22/2019   Cellulitis of right foot 08/22/2019   Ulcer of right foot with fat layer exposed (HCC) 08/22/2019   Osteoarthritis of right knee 04/01/2011   Past Medical History:  Diagnosis Date   Chronic kidney disease    stones   Diabetes mellitus without complication (HCC)    GERD (gastroesophageal reflux disease)    Hypertension    Shingles rash 13   dx yesterday 03/21/11    History reviewed. No pertinent family history.  Past Surgical History:  Procedure Laterality Date   APPENDECTOMY     child   IRRIGATION AND DEBRIDEMENT FOOT Right  08/23/2019   Procedure: RIGHT FOOT IRRIGATION AND DEBRIDEMENT, RIGHT ACHILLES TENDON LENGTHENING,EXCISION RIGHT FIRST METATARSAL;  Surgeon: Gwyneth Revels, DPM;  Location: [facility] ORS;  Service: Podiatry;  Laterality: Right;   TOE AMPUTATION  79   rt great and second   TOTAL KNEE ARTHROPLASTY  03/30/2011   Procedure: TOTAL KNEE ARTHROPLASTY;  Surgeon: Nadara Mustard, MD;  Location: [facility] OR;  Service: Orthopedics;  Laterality: Right;  Right Total Knee Arthroplastly   Social History   Occupational History   Not on file  Tobacco Use   Smoking status: Former   Smokeless tobacco: Never  Vaping Use   Vaping Use: Never used  Substance and Sexual Activity   Alcohol use: Yes    Alcohol/week: 2.0 - 3.0 standard drinks    Types: 2 - 3 Cans of beer per week   Drug use: No   Sexual activity: Not on file

## 2020-12-28 ENCOUNTER — Ambulatory Visit: Payer: Managed Care, Other (non HMO) | Admitting: Orthopedic Surgery

## 2022-02-14 ENCOUNTER — Emergency Department: Payer: BLUE CROSS/BLUE SHIELD

## 2022-02-14 ENCOUNTER — Encounter: Payer: Self-pay | Admitting: Emergency Medicine

## 2022-02-14 ENCOUNTER — Other Ambulatory Visit: Payer: Self-pay

## 2022-02-14 ENCOUNTER — Inpatient Hospital Stay
Admission: EM | Admit: 2022-02-14 | Discharge: 2022-02-17 | DRG: 617 | Disposition: A | Discharge status: Home Health | Payer: BLUE CROSS/BLUE SHIELD | Attending: Osteopathic Medicine | Admitting: Osteopathic Medicine

## 2022-02-14 DIAGNOSIS — L089 Local infection of the skin and subcutaneous tissue, unspecified: Secondary | ICD-10-CM | POA: Diagnosis not present

## 2022-02-14 DIAGNOSIS — Z7984 Long term (current) use of oral hypoglycemic drugs: Secondary | ICD-10-CM | POA: Diagnosis not present

## 2022-02-14 DIAGNOSIS — M7661 Achilles tendinitis, right leg: Secondary | ICD-10-CM | POA: Diagnosis present

## 2022-02-14 DIAGNOSIS — L03039 Cellulitis of unspecified toe: Secondary | ICD-10-CM | POA: Diagnosis present

## 2022-02-14 DIAGNOSIS — M86171 Other acute osteomyelitis, right ankle and foot: Secondary | ICD-10-CM | POA: Diagnosis present

## 2022-02-14 DIAGNOSIS — K219 Gastro-esophageal reflux disease without esophagitis: Secondary | ICD-10-CM | POA: Diagnosis present

## 2022-02-14 DIAGNOSIS — Z794 Long term (current) use of insulin: Secondary | ICD-10-CM | POA: Diagnosis not present

## 2022-02-14 DIAGNOSIS — Z885 Allergy status to narcotic agent status: Secondary | ICD-10-CM | POA: Diagnosis not present

## 2022-02-14 DIAGNOSIS — Z96651 Presence of right artificial knee joint: Secondary | ICD-10-CM | POA: Diagnosis present

## 2022-02-14 DIAGNOSIS — E1169 Type 2 diabetes mellitus with other specified complication: Principal | ICD-10-CM | POA: Diagnosis present

## 2022-02-14 DIAGNOSIS — E1140 Type 2 diabetes mellitus with diabetic neuropathy, unspecified: Secondary | ICD-10-CM | POA: Diagnosis present

## 2022-02-14 DIAGNOSIS — L02619 Cutaneous abscess of unspecified foot: Secondary | ICD-10-CM | POA: Diagnosis present

## 2022-02-14 DIAGNOSIS — E1151 Type 2 diabetes mellitus with diabetic peripheral angiopathy without gangrene: Secondary | ICD-10-CM | POA: Diagnosis present

## 2022-02-14 DIAGNOSIS — I10 Essential (primary) hypertension: Secondary | ICD-10-CM | POA: Diagnosis present

## 2022-02-14 DIAGNOSIS — Z7951 Long term (current) use of inhaled steroids: Secondary | ICD-10-CM | POA: Diagnosis not present

## 2022-02-14 DIAGNOSIS — L03031 Cellulitis of right toe: Secondary | ICD-10-CM | POA: Diagnosis present

## 2022-02-14 DIAGNOSIS — Z89421 Acquired absence of other right toe(s): Secondary | ICD-10-CM | POA: Diagnosis not present

## 2022-02-14 DIAGNOSIS — Z87891 Personal history of nicotine dependence: Secondary | ICD-10-CM

## 2022-02-14 DIAGNOSIS — L02611 Cutaneous abscess of right foot: Secondary | ICD-10-CM | POA: Diagnosis present

## 2022-02-14 DIAGNOSIS — E11628 Type 2 diabetes mellitus with other skin complications: Secondary | ICD-10-CM | POA: Diagnosis present

## 2022-02-14 DIAGNOSIS — Z79899 Other long term (current) drug therapy: Secondary | ICD-10-CM

## 2022-02-14 DIAGNOSIS — Z89411 Acquired absence of right great toe: Secondary | ICD-10-CM | POA: Diagnosis not present

## 2022-02-14 DIAGNOSIS — E1165 Type 2 diabetes mellitus with hyperglycemia: Secondary | ICD-10-CM | POA: Diagnosis present

## 2022-02-14 LAB — C-REACTIVE PROTEIN: CRP: 6.4 mg/dL — ABNORMAL HIGH (ref <1.0)

## 2022-02-14 LAB — CBC WITH DIFFERENTIAL/PLATELET
Abs Immature Granulocytes: 0.05 10*3/uL (ref 0.00–0.07)
Basophils Absolute: 0 10*3/uL (ref 0.0–0.1)
Basophils Relative: 0 %
Eosinophils Absolute: 0.2 10*3/uL (ref 0.0–0.5)
Eosinophils Relative: 3 %
HCT: 39.2 % (ref 39.0–52.0)
Hemoglobin: 13.7 g/dL (ref 13.0–17.0)
Immature Granulocytes: 1 %
Lymphocytes Relative: 15 %
Lymphs Abs: 1.4 10*3/uL (ref 0.7–4.0)
MCH: 30 pg (ref 26.0–34.0)
MCHC: 34.9 g/dL (ref 30.0–36.0)
MCV: 85.8 fL (ref 80.0–100.0)
Monocytes Absolute: 0.7 10*3/uL (ref 0.1–1.0)
Monocytes Relative: 8 %
Neutro Abs: 6.8 10*3/uL (ref 1.7–7.7)
Neutrophils Relative %: 73 %
Platelets: 331 10*3/uL (ref 150–400)
RBC: 4.57 MIL/uL (ref 4.22–5.81)
RDW: 11.9 % (ref 11.5–15.5)
WBC: 9.2 10*3/uL (ref 4.0–10.5)
nRBC: 0 % (ref 0.0–0.2)

## 2022-02-14 LAB — BASIC METABOLIC PANEL
Anion gap: 11 (ref 5–15)
BUN: 15 mg/dL (ref 8–23)
CO2: 26 mmol/L (ref 22–32)
Calcium: 9.5 mg/dL (ref 8.9–10.3)
Chloride: 95 mmol/L — ABNORMAL LOW (ref 98–111)
Creatinine, Ser: 0.92 mg/dL (ref 0.61–1.24)
GFR, Estimated: >60 mL/min (ref >60)
Glucose, Bld: 331 mg/dL — ABNORMAL HIGH (ref 70–99)
Potassium: 4.1 mmol/L (ref 3.5–5.1)
Sodium: 132 mmol/L — ABNORMAL LOW (ref 135–145)

## 2022-02-14 LAB — SEDIMENTATION RATE: Sed Rate: 107 mm/hr — ABNORMAL HIGH (ref 0–20)

## 2022-02-14 LAB — HEMOGLOBIN A1C
Hgb A1c MFr Bld: 13.1 % — ABNORMAL HIGH (ref 4.8–5.6)
Mean Plasma Glucose: 329.27 mg/dL

## 2022-02-14 LAB — CBG MONITORING, ED: Glucose-Capillary: 318 mg/dL — ABNORMAL HIGH (ref 70–99)

## 2022-02-14 LAB — MRSA NEXT GEN BY PCR, NASAL: MRSA by PCR Next Gen: NOT DETECTED

## 2022-02-14 LAB — PREALBUMIN: Prealbumin: 10 mg/dL — ABNORMAL LOW (ref 18–38)

## 2022-02-14 MED ORDER — SODIUM CHLORIDE 0.9 % IV SOLN: INTRAVENOUS | Status: DC

## 2022-02-14 MED ORDER — GABAPENTIN 300 MG PO CAPS
300.0000 mg | ORAL_CAPSULE | Freq: Every morning | ORAL | Status: DC
  Administered 2022-02-15 – 2022-02-17 (×2): 300 mg via ORAL
  Filled 2022-02-14 (×2): qty 1

## 2022-02-14 MED ORDER — ROSUVASTATIN CALCIUM 10 MG PO TABS
40.0000 mg | ORAL_TABLET | Freq: Every day | ORAL | Status: DC
  Administered 2022-02-14 – 2022-02-17 (×3): 40 mg via ORAL
  Filled 2022-02-14: qty 2
  Filled 2022-02-14 (×2): qty 4

## 2022-02-14 MED ORDER — INSULIN DETEMIR 100 UNIT/ML ~~LOC~~ SOLN
20.0000 [IU] | Freq: Every day | SUBCUTANEOUS | Status: DC
  Administered 2022-02-14 – 2022-02-17 (×4): 20 [IU] via SUBCUTANEOUS
  Filled 2022-02-14 (×4): qty 0.2

## 2022-02-14 MED ORDER — HYDROCHLOROTHIAZIDE 12.5 MG PO TABS
12.5000 mg | ORAL_TABLET | Freq: Every day | ORAL | Status: DC
  Administered 2022-02-15 – 2022-02-17 (×2): 12.5 mg via ORAL
  Filled 2022-02-14 (×2): qty 1

## 2022-02-14 MED ORDER — METOPROLOL TARTRATE 25 MG PO TABS
25.0000 mg | ORAL_TABLET | Freq: Two times a day (BID) | ORAL | Status: DC
  Administered 2022-02-15: 25 mg via ORAL
  Filled 2022-02-14 (×2): qty 1

## 2022-02-14 MED ORDER — MORPHINE SULFATE (PF) 2 MG/ML IV SOLN
2.0000 mg | Freq: Once | INTRAVENOUS | Status: AC
  Administered 2022-02-14: 2 mg via INTRAVENOUS
  Filled 2022-02-14: qty 1

## 2022-02-14 MED ORDER — IOHEXOL 300 MG/ML  SOLN
80.0000 mL | Freq: Once | INTRAMUSCULAR | Status: AC | PRN
  Administered 2022-02-14: 80 mL via INTRAVENOUS

## 2022-02-14 MED ORDER — FLUTICASONE FUROATE 27.5 MCG/SPRAY NA SUSP: 2.0000 | Freq: Every day | NASAL | Status: DC

## 2022-02-14 MED ORDER — SODIUM CHLORIDE 0.9 % IV SOLN
2.0000 g | INTRAVENOUS | Status: DC
  Administered 2022-02-14 – 2022-02-16 (×3): 2 g via INTRAVENOUS
  Filled 2022-02-14 (×2): qty 20
  Filled 2022-02-14: qty 2
  Filled 2022-02-14: qty 20

## 2022-02-14 MED ORDER — ALBUTEROL SULFATE (2.5 MG/3ML) 0.083% IN NEBU: 2.5000 mg | INHALATION_SOLUTION | Freq: Four times a day (QID) | RESPIRATORY_TRACT | Status: DC | PRN

## 2022-02-14 MED ORDER — PANTOPRAZOLE SODIUM 40 MG PO TBEC
40.0000 mg | DELAYED_RELEASE_TABLET | Freq: Every day | ORAL | Status: DC
  Administered 2022-02-14 – 2022-02-17 (×3): 40 mg via ORAL
  Filled 2022-02-14 (×3): qty 1

## 2022-02-14 MED ORDER — INSULIN LISPRO (1 UNIT DIAL) 100 UNIT/ML (KWIKPEN): 12.0000 [IU] | PEN_INJECTOR | Freq: Three times a day (TID) | SUBCUTANEOUS | Status: DC

## 2022-02-14 MED ORDER — HYDROCODONE-ACETAMINOPHEN 5-325 MG PO TABS
1.0000 | ORAL_TABLET | Freq: Four times a day (QID) | ORAL | Status: DC | PRN
  Administered 2022-02-14 – 2022-02-15 (×4): 1 via ORAL
  Filled 2022-02-14 (×4): qty 1

## 2022-02-14 MED ORDER — ONDANSETRON HCL 4 MG PO TABS: 4.0000 mg | ORAL_TABLET | Freq: Four times a day (QID) | ORAL | Status: DC | PRN

## 2022-02-14 MED ORDER — LISINOPRIL-HYDROCHLOROTHIAZIDE 20-25 MG PO TABS: 0.5000 | ORAL_TABLET | Freq: Every day | ORAL | Status: DC

## 2022-02-14 MED ORDER — ONDANSETRON HCL 4 MG/2ML IJ SOLN: 4.0000 mg | Freq: Four times a day (QID) | INTRAMUSCULAR | Status: DC | PRN

## 2022-02-14 MED ORDER — FLUTICASONE PROPIONATE 50 MCG/ACT NA SUSP
2.0000 | Freq: Every day | NASAL | Status: DC
  Administered 2022-02-15 – 2022-02-17 (×2): 2 via NASAL
  Filled 2022-02-14: qty 16

## 2022-02-14 MED ORDER — LISINOPRIL 10 MG PO TABS
10.0000 mg | ORAL_TABLET | Freq: Every day | ORAL | Status: DC
  Administered 2022-02-15 – 2022-02-17 (×2): 10 mg via ORAL
  Filled 2022-02-14 (×2): qty 1

## 2022-02-14 MED ORDER — METRONIDAZOLE 500 MG/100ML IV SOLN
500.0000 mg | Freq: Two times a day (BID) | INTRAVENOUS | Status: DC
  Administered 2022-02-14 – 2022-02-17 (×6): 500 mg via INTRAVENOUS
  Filled 2022-02-14 (×7): qty 100

## 2022-02-14 MED ORDER — INSULIN ASPART 100 UNIT/ML IJ SOLN
0.0000 [IU] | Freq: Three times a day (TID) | INTRAMUSCULAR | Status: DC
  Administered 2022-02-14: 11 [IU] via SUBCUTANEOUS
  Administered 2022-02-15: 3 [IU] via SUBCUTANEOUS
  Administered 2022-02-15: 5 [IU] via SUBCUTANEOUS
  Administered 2022-02-15: 11 [IU] via SUBCUTANEOUS
  Administered 2022-02-16: 8 [IU] via SUBCUTANEOUS
  Administered 2022-02-16: 3 [IU] via SUBCUTANEOUS
  Administered 2022-02-17: 8 [IU] via SUBCUTANEOUS
  Administered 2022-02-17: 3 [IU] via SUBCUTANEOUS
  Filled 2022-02-14 (×8): qty 1

## 2022-02-14 MED ORDER — VANCOMYCIN HCL 2000 MG/400ML IV SOLN
2000.0000 mg | Freq: Once | INTRAVENOUS | Status: AC
  Administered 2022-02-14: 2000 mg via INTRAVENOUS
  Filled 2022-02-14: qty 400

## 2022-02-14 MED ORDER — PIPERACILLIN-TAZOBACTAM 3.375 G IVPB 30 MIN
3.3750 g | Freq: Once | INTRAVENOUS | Status: AC
  Administered 2022-02-14: 3.375 g via INTRAVENOUS
  Filled 2022-02-14: qty 50

## 2022-02-14 MED ORDER — IOHEXOL 300 MG/ML  SOLN: 100.0000 mL | Freq: Once | INTRAMUSCULAR | Status: DC | PRN

## 2022-02-14 MED ORDER — HYDROMORPHONE HCL 1 MG/ML IJ SOLN
0.5000 mg | Freq: Once | INTRAMUSCULAR | Status: AC
  Administered 2022-02-14: 0.5 mg via INTRAVENOUS
  Filled 2022-02-14: qty 0.5

## 2022-02-14 NOTE — Assessment & Plan Note (Addendum)
Patient has a history of insulin-dependent type II diabetes mellitus and presents with hyperglycemia most likely related to cellulitis Start patient on Levemir 20 units daily Place patient on sliding scale insulin Maintain consistent carbohydrate diet IV fluid hydration
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
## 2022-02-14 NOTE — ED Triage Notes (Signed)
Pt to ED via POV from Dr. Earney Mallet office. Pt states that he was sent over for admission. Pt reports that Dr. Ether Griffins told him that he needed surgery on his right foot as soon as possible. Pt is currently in NAD.

## 2022-02-14 NOTE — Assessment & Plan Note (Signed)
Continue hydrochlorothiazide, lisinopril and metoprolol

## 2022-02-14 NOTE — Progress Notes (Signed)
       CROSS COVER NOTE  NAME: Jared Castillo MRN: [id number] DOB : 1950/04/28 ATTENDING PHYSICIAN: Lucile Shutters, MD    Date of Service   02/14/2022   HPI/Events of Note   Medication request received for patient report of 8/10 foot pain not relieved by Vicodin.  Interventions   Assessment/Plan:  Morphine x1    This document was prepared using Dragon voice recognition software and may include unintentional dictation errors.  Bishop Limbo DNP, MBA, FNP-BC Nurse Practitioner [facility] Pager [phone]

## 2022-02-14 NOTE — Assessment & Plan Note (Signed)
Patient presents to the ER for evaluation of redness and pain over the dorsum of the right foot towards the fifth metatarsal Imaging is suggestive of possible abscess Place patient empirically on Rocephin and Flagyl He received a dose of IV vancomycin in the ER Podiatry consult

## 2022-02-14 NOTE — H&P (Signed)
History and Physical    Patient: Jared Castillo TGY:[id number] DOB: 07-Jun-1950 DOA: 02/14/2022 DOS: the patient was seen and examined on 02/14/2022 PCP: Verner Mould, MD  Patient coming from: Home  Chief Complaint:  Chief Complaint  Patient presents with   Foot Pain   HPI: Jared Castillo is a 71 y.o. 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  He rates his pain a 7 x 10 in intensity at its worst and denies any trauma. He was seen in the emergency room at [facility] and was prescribed oral antibiotics and pain medication without any improvement in his symptoms.  He went to see his podiatrist and was referred to the ER for admission for possible amputation. He denies having any fever or chills.  He denies having any chest pain, no shortness of breath, no abdominal pain, no urinary symptoms, no changes in his bowel habits, no dizziness, no lightheadedness, no headache, no blurred vision no focal deficit He had a CT scan of the right foot with contrast which showed apparent interval increased soft tissue swelling laterally in the forefoot at the level of the 5th metatarsophalangeal joint with possible underlying small fluid collection. This could reflect cellulitis and an abscess. No CT evidence of adjacent osteomyelitis. Interval amputation through the 1st metatarsal shaft without evidence of recurrent osteomyelitis. Multiple other chronic posttraumatic and postsurgical deformities in the forefoot are stable. 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 He received a dose of Zosyn and vancomycin and will be admitted to the hospital for further evaluation     Review of Systems: As mentioned in the history of present  illness. All other systems reviewed and are negative. Past Medical History:  Diagnosis Date   Chronic kidney disease    stones   Diabetes mellitus without complication (HCC)    GERD (gastroesophageal reflux disease)    Hypertension    Shingles rash 12   dx yesterday 03/21/11   Past Surgical History:  Procedure Laterality Date   APPENDECTOMY     child   IRRIGATION AND DEBRIDEMENT FOOT Right 08/23/2019   Procedure: RIGHT FOOT IRRIGATION AND DEBRIDEMENT, RIGHT ACHILLES TENDON LENGTHENING,EXCISION RIGHT FIRST METATARSAL;  Surgeon: Gwyneth Revels, DPM;  Location: [facility] ORS;  Service: Podiatry;  Laterality: Right;   TOE AMPUTATION  79   rt great and second   TOTAL KNEE ARTHROPLASTY  03/30/2011   Procedure: TOTAL KNEE ARTHROPLASTY;  Surgeon: Nadara Mustard, MD;  Location: [facility] OR;  Service: Orthopedics;  Laterality: Right;  Right Total Knee Arthroplastly   Social History:  reports that he has quit smoking. He has never used smokeless tobacco. He reports current alcohol use of about 2.0 - 3.0 standard drinks of alcohol per week. He reports that he does not use drugs.  Allergies  Allergen Reactions   Trazodone Other (See Comments)    Nightmares    No family history on file.  Prior to Admission medications   Medication Sig Start Date End Date Taking? Authorizing Provider  albuterol (VENTOLIN HFA) 108 (90 Base) MCG/ACT inhaler Inhale 1-2 puffs into the lungs every 6 (six) hours as needed for wheezing or shortness of breath.   Yes [provider]  fluticasone (VERAMYST) 27.5 MCG/SPRAY nasal spray Place 2 sprays into the nose daily.  Yes [provider]  gabapentin (NEURONTIN) 300 MG capsule Take 300 mg by mouth 3 (three) times daily.   Yes [provider]  insulin lispro (HUMALOG) 100 UNIT/ML KwikPen Inject 8-12 Units into the skin 3 (three) times daily. 01/15/22  Yes [provider]  insulin NPH Human (NOVOLIN N) 100 UNIT/ML injection Inject 18 Units into  the skin daily before breakfast.   Yes [provider]  lisinopril-hydrochlorothiazide (ZESTORETIC) 20-25 MG tablet Take 0.5 tablets by mouth daily.   Yes [provider]  metFORMIN (GLUCOPHAGE) 500 MG tablet Take 1,000 mg by mouth 2 (two) times daily with a meal.   Yes [provider]  metoprolol succinate (TOPROL-XL) 50 MG 24 hr tablet Take 50 mg by mouth daily. Take with or immediately following a meal.   Yes [provider]  metoprolol tartrate (LOPRESSOR) 25 MG tablet Take 25 mg by mouth 2 (two) times daily.     Yes [provider]  omeprazole (PRILOSEC) 40 MG capsule Take 40 mg by mouth daily.     Yes [provider]  rosuvastatin (CRESTOR) 40 MG tablet Take 40 mg by mouth daily.   Yes [provider]  sitaGLIPtin (JANUVIA) 100 MG tablet Take 100 mg by mouth daily.   Yes [provider]  HYDROcodone-acetaminophen (NORCO/VICODIN) 5-325 MG tablet Take 1 tablet by mouth every 6 (six) hours as needed for moderate pain. Patient not taking: Reported on 02/14/2022 08/25/19   Marrion Coy, MD  HYDROcodone-acetaminophen (VICODIN) 5-500 MG per tablet Take 1 tablet by mouth every 6 (six) hours as needed. For pain  Patient not taking: Reported on 02/14/2022    [provider]    Physical Exam: Vitals:   02/14/22 1245 02/14/22 1300 02/14/22 1353 02/14/22 1400  BP:  124/71 (!) 134/97 121/70  Pulse: 83 84 78 84  Resp:   18   Temp:   98.1 F (36.7 C)   TempSrc:   Oral   SpO2: 99% 96% 96% 94%  Weight:      Height:       Physical Exam Vitals and nursing note reviewed.  Constitutional:      Appearance: Normal appearance.  HENT:     Head: Normocephalic and atraumatic.     Nose: Nose normal.     Mouth/Throat:     Mouth: Mucous membranes are moist.  Eyes:     Conjunctiva/sclera: Conjunctivae normal.  Cardiovascular:     Rate and Rhythm: Normal rate and regular rhythm.  Pulmonary:     Effort: Pulmonary effort is  normal.     Breath sounds: Normal breath sounds.  Abdominal:     General: Abdomen is flat. Bowel sounds are normal.     Palpations: Abdomen is soft.  Musculoskeletal:     Cervical back: Normal range of motion and neck supple.     Comments: Decreased range of motion right foot  Neurological:     General: No focal deficit present.     Mental Status: He is alert and oriented to person, place, and time.  Psychiatric:        Mood and Affect: Mood normal.        Behavior: Behavior normal.     Data Reviewed: Relevant notes from primary care and specialist visits, past discharge summaries as available in EHR, including Care Everywhere. Prior diagnostic testing as pertinent to current admission diagnoses Updated medications and problem lists for reconciliation ED course, including vitals, labs, imaging, treatment and response to treatment  Triage notes, nursing and pharmacy notes and ED provider's notes Notable results as noted in HPI Labs reviewed.  Sodium 132, potassium 4.1, chloride 95, bicarb 26, glucose 331, BUN 15, creatinine 0.92, calcium 9.5, white count 9.2, hemoglobin 13.7, hematocrit 39.2, platelet count 331 There are no new results to review at this time.  Assessment and Plan: Cellulitis and abscess of toe of right foot Patient presents to the ER for evaluation of redness and pain over the dorsum of the right foot towards the fifth metatarsal Imaging is suggestive of possible abscess Place patient empirically on Rocephin and Flagyl He received a dose of IV vancomycin in the ER Podiatry consult  Uncontrolled type 2 diabetes mellitus with hyperglycemia, with long-term current use of insulin (HCC) Patient has a history of insulin-dependent type II diabetes mellitus and presents with hyperglycemia most likely related to cellulitis Start patient on Levemir 20 units daily Place patient on sliding scale insulin Maintain consistent carbohydrate diet IV fluid hydration  Essential  hypertension Continue hydrochlorothiazide, lisinopril and metoprolol      Advance Care Planning:   Code Status: Full Code   Consults: Podiatry  Family Communication: Greater than 50% of time was spent discussing patient's condition and plan of care with him at the bedside.  All questions and concerns have been addressed.  He verbalizes understanding and agrees with the plan.  Severity of Illness: The appropriate patient status for this patient is INPATIENT. Inpatient status is judged to be reasonable and necessary in order to provide the required intensity of service to ensure the patient's safety. The patient's presenting symptoms, physical exam findings, and initial radiographic and laboratory data in the context of their chronic comorbidities is felt to place them at high risk for further clinical deterioration. Furthermore, it is not anticipated that the patient will be medically stable for discharge from the hospital within 2 midnights of admission.   * I certify that at the point of admission it is my clinical judgment that the patient will require inpatient hospital care spanning beyond 2 midnights from the point of admission due to high intensity of service, high risk for further deterioration and high frequency of surveillance required.*  Author: Lucile Shutters, MD 02/14/2022 3:52 PM  For on call review [email or web address].
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
## 2022-02-14 NOTE — ED Notes (Signed)
Dr Agbata at bedside. 

## 2022-02-14 NOTE — ED Notes (Signed)
Pt in CT.

## 2022-02-15 ENCOUNTER — Encounter: Payer: Self-pay | Admitting: Internal Medicine

## 2022-02-15 ENCOUNTER — Inpatient Hospital Stay: Payer: BLUE CROSS/BLUE SHIELD

## 2022-02-15 DIAGNOSIS — L089 Local infection of the skin and subcutaneous tissue, unspecified: Secondary | ICD-10-CM | POA: Diagnosis not present

## 2022-02-15 DIAGNOSIS — E1165 Type 2 diabetes mellitus with hyperglycemia: Secondary | ICD-10-CM

## 2022-02-15 DIAGNOSIS — E11628 Type 2 diabetes mellitus with other skin complications: Secondary | ICD-10-CM

## 2022-02-15 DIAGNOSIS — I10 Essential (primary) hypertension: Secondary | ICD-10-CM | POA: Diagnosis not present

## 2022-02-15 LAB — BASIC METABOLIC PANEL
Anion gap: 9 (ref 5–15)
BUN: 16 mg/dL (ref 8–23)
CO2: 25 mmol/L (ref 22–32)
Calcium: 8.6 mg/dL — ABNORMAL LOW (ref 8.9–10.3)
Chloride: 99 mmol/L (ref 98–111)
Creatinine, Ser: 0.88 mg/dL (ref 0.61–1.24)
GFR, Estimated: >60 mL/min (ref >60)
Glucose, Bld: 217 mg/dL — ABNORMAL HIGH (ref 70–99)
Potassium: 3.4 mmol/L — ABNORMAL LOW (ref 3.5–5.1)
Sodium: 133 mmol/L — ABNORMAL LOW (ref 135–145)

## 2022-02-15 LAB — CBC
HCT: 33.5 % — ABNORMAL LOW (ref 39.0–52.0)
Hemoglobin: 11.9 g/dL — ABNORMAL LOW (ref 13.0–17.0)
MCH: 30.1 pg (ref 26.0–34.0)
MCHC: 35.5 g/dL (ref 30.0–36.0)
MCV: 84.8 fL (ref 80.0–100.0)
Platelets: 279 10*3/uL (ref 150–400)
RBC: 3.95 MIL/uL — ABNORMAL LOW (ref 4.22–5.81)
RDW: 11.9 % (ref 11.5–15.5)
WBC: 7.1 10*3/uL (ref 4.0–10.5)
nRBC: 0 % (ref 0.0–0.2)

## 2022-02-15 LAB — GLUCOSE, CAPILLARY
Glucose-Capillary: 173 mg/dL — ABNORMAL HIGH (ref 70–99)
Glucose-Capillary: 250 mg/dL — ABNORMAL HIGH (ref 70–99)
Glucose-Capillary: 344 mg/dL — ABNORMAL HIGH (ref 70–99)
Glucose-Capillary: 229 mg/dL — ABNORMAL HIGH (ref 70–99)
Glucose-Capillary: 260 mg/dL — ABNORMAL HIGH (ref 70–99)

## 2022-02-15 LAB — HIV ANTIBODY (ROUTINE TESTING W REFLEX): HIV Screen 4th Generation wRfx: NONREACTIVE

## 2022-02-15 MED ORDER — HYDROMORPHONE HCL 1 MG/ML IJ SOLN
0.5000 mg | Freq: Two times a day (BID) | INTRAMUSCULAR | Status: DC | PRN
  Administered 2022-02-16 (×2): 0.5 mg via INTRAVENOUS
  Filled 2022-02-15 (×2): qty 1

## 2022-02-15 MED ORDER — HYDROCODONE-ACETAMINOPHEN 5-325 MG PO TABS
1.0000 | ORAL_TABLET | ORAL | Status: DC | PRN
  Administered 2022-02-15 – 2022-02-17 (×8): 1 via ORAL
  Filled 2022-02-15 (×8): qty 1

## 2022-02-15 MED ORDER — ZINC SULFATE 220 (50 ZN) MG PO CAPS
220.0000 mg | ORAL_CAPSULE | Freq: Every day | ORAL | Status: DC
  Administered 2022-02-15 – 2022-02-17 (×2): 220 mg via ORAL
  Filled 2022-02-15 (×2): qty 1

## 2022-02-15 MED ORDER — METOPROLOL SUCCINATE ER 50 MG PO TB24
50.0000 mg | ORAL_TABLET | Freq: Every day | ORAL | Status: DC
  Administered 2022-02-15 – 2022-02-17 (×3): 50 mg via ORAL
  Filled 2022-02-15 (×3): qty 1

## 2022-02-15 MED ORDER — ACETAMINOPHEN 325 MG PO TABS: 650.0000 mg | ORAL_TABLET | Freq: Four times a day (QID) | ORAL | Status: DC | PRN

## 2022-02-15 MED ORDER — ADULT MULTIVITAMIN W/MINERALS CH
1.0000 | ORAL_TABLET | Freq: Every day | ORAL | Status: DC
  Administered 2022-02-15 – 2022-02-17 (×2): 1 via ORAL
  Filled 2022-02-15 (×2): qty 1

## 2022-02-15 MED ORDER — VITAMIN C 500 MG PO TABS
500.0000 mg | ORAL_TABLET | Freq: Two times a day (BID) | ORAL | Status: DC
  Administered 2022-02-15 – 2022-02-17 (×3): 500 mg via ORAL
  Filled 2022-02-15 (×3): qty 1

## 2022-02-15 NOTE — Inpatient Diabetes Management (Addendum)
Inpatient Diabetes Program Recommendations  AACE/ADA: New Consensus Statement on Inpatient Glycemic Control (2015)  Target Ranges:  Prepandial:   less than 140 mg/dL      Peak postprandial:   less than 180 mg/dL (1-2 hours)      Critically ill patients:  140 - 180 mg/dL    Latest Reference Range & Units 02/14/22 17:47  Hemoglobin A1C 4.8 - 5.6 % 13.1 (H)  329 mg/dl  (H): Data is abnormally high  Latest Reference Range & Units 02/14/22 17:54  Glucose-Capillary 70 - 99 mg/dL 318 (H)  11 units Novolog  20 units Levemir _0     Latest Reference Range & Units 02/15/22 08:20 02/15/22 11:47  Glucose-Capillary 70 - 99 mg/dL 173 (H)  3 units Novolog  20 units Levemir 250 (H)  (H): Data is abnormally high   Admit: Cellulitis and abscess of toe of right foot   Home DM Meds: NPH 18 units Daily      Januvia 100 mg Daily     Metformin 500 mg BID   Current Orders: Levemir 20 units Daily      Novolog Moderate Correction Scale/ SSI (0-15 units) TID AC     PCP: Dr. Shawna Castillo with [facility] primary Care at [place] PCP visit for DM was 12/07/21  (A1c was 14% at that visit) Was told to take the following:  Levemir 35 units BID Humalog 8 units TID  Humalog 2-10 units TID per SSI  Metformin 1000 mg BID    MD- Note CBG 250 pre-lunch today.  Pt takes Humalog meal coverage at home.   Please consider starting Novolog 4 units TID with meals (50% home dose to start) HOLD if pt NPO HOLD it pt eats <50% meals   Addendum 10am--Met w/ pt at bedside this AM to review home DM care regimen.  Pt confirmed he is taking the above listed diabetes meds (Levemir, Humalog, Metformin)--Is NOT taking NPH Insulin nor Januvia.  Called Pharmacy to alert them to discrepancies between Home Med Rec and what pt states he is taking--Pharmacy to see pt as well and confirm update.  Pt uses the [employer] 14 day CGM system.  Has PDM that helps scan for CBGs at home.  Pt interested in upgrading to the [place] 3 CGM  system so he can use his phone to scan for CBGs--Encouraged pt to ask PCP for upgrade to the [place] 3 system.  Pt states he has all insulins and meds at home and takes as prescribed.  Spoke with patient about his current A1c of 13.1% (which is 1 point down from last A1c in Sept when it was 14%).  Explained what an A1c is and what it measures.  Reminded patient that his goal A1c is 7% or less per ADA standards to prevent both acute and long-term complications.  Explained to patient the extreme importance of good glucose control at home, especially in light of foot infection and need to help heal after I&D and to help prevent further foot wounds and other infections.  Encouraged patient to check his CBGs at least TID AC + HS (and 2-hrs after meals as well) and to record all CBGs in a logbook for his PCP to review.  Reviewed goal CBGs for home.  Discussed with pt the current Insulin regimen we have him and how we will monitor CBGs daily and the MD will make insulin adjustments daily as needed.  Pt agreeable.    --Will follow patient during hospitalization--  Jared Castillo  Jared Paynter RN, MSN, CDCES Diabetes Coordinator Inpatient Glycemic Control Team Team Pager: [phone] (8a-5p)  

## 2022-02-15 NOTE — TOC Progression Note (Signed)
Transition of Care [facility]) - Progression Note    Patient Details  Name: Jared Castillo MRN: [id number] Date of Birth: 1950/09/13  Transition of Care [facility]) CM/SW Contact  Marlowe Sax, RN Phone Number: 02/15/2022, 9:29 AM  Clinical Narrative:    TOC to follow the patient and assist with needs and DC Planning, He is on IV ABX and will likely have I&D.  Lives at Home with Wife Selena Batten' Works for [employer]       Expected Discharge Plan and Services                                                 Social Determinants of Health (SDOH) Interventions    Readmission Risk Interventions     No data to display
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
## 2022-02-16 ENCOUNTER — Inpatient Hospital Stay: Payer: BLUE CROSS/BLUE SHIELD

## 2022-02-16 ENCOUNTER — Encounter: Payer: Self-pay | Admitting: Podiatry

## 2022-02-16 ENCOUNTER — Other Ambulatory Visit: Payer: Self-pay

## 2022-02-16 ENCOUNTER — Inpatient Hospital Stay: Payer: BLUE CROSS/BLUE SHIELD | Admitting: Anesthesiology

## 2022-02-16 ENCOUNTER — Encounter
Admission: EM | Disposition: A | Discharge status: Home Health | Payer: Self-pay | Source: Home / Self Care | Attending: Osteopathic Medicine

## 2022-02-16 DIAGNOSIS — I10 Essential (primary) hypertension: Secondary | ICD-10-CM | POA: Diagnosis not present

## 2022-02-16 DIAGNOSIS — L02611 Cutaneous abscess of right foot: Secondary | ICD-10-CM | POA: Diagnosis not present

## 2022-02-16 DIAGNOSIS — E1165 Type 2 diabetes mellitus with hyperglycemia: Secondary | ICD-10-CM | POA: Diagnosis not present

## 2022-02-16 DIAGNOSIS — L03031 Cellulitis of right toe: Secondary | ICD-10-CM | POA: Diagnosis not present

## 2022-02-16 HISTORY — PX: TRANSMETATARSAL AMPUTATION: SHX6197

## 2022-02-16 HISTORY — PX: IRRIGATION AND DEBRIDEMENT FOOT: SHX6602

## 2022-02-16 LAB — CBC
HCT: 34.9 % — ABNORMAL LOW (ref 39.0–52.0)
Hemoglobin: 12.2 g/dL — ABNORMAL LOW (ref 13.0–17.0)
MCH: 29.7 pg (ref 26.0–34.0)
MCHC: 35 g/dL (ref 30.0–36.0)
MCV: 84.9 fL (ref 80.0–100.0)
Platelets: 273 10*3/uL (ref 150–400)
RBC: 4.11 MIL/uL — ABNORMAL LOW (ref 4.22–5.81)
RDW: 11.9 % (ref 11.5–15.5)
WBC: 5.8 10*3/uL (ref 4.0–10.5)
nRBC: 0 % (ref 0.0–0.2)

## 2022-02-16 LAB — GLUCOSE, CAPILLARY
Glucose-Capillary: 251 mg/dL — ABNORMAL HIGH (ref 70–99)
Glucose-Capillary: 192 mg/dL — ABNORMAL HIGH (ref 70–99)
Glucose-Capillary: 196 mg/dL — ABNORMAL HIGH (ref 70–99)
Glucose-Capillary: 191 mg/dL — ABNORMAL HIGH (ref 70–99)
Glucose-Capillary: 277 mg/dL — ABNORMAL HIGH (ref 70–99)

## 2022-02-16 LAB — BASIC METABOLIC PANEL
Anion gap: 8 (ref 5–15)
BUN: 14 mg/dL (ref 8–23)
CO2: 23 mmol/L (ref 22–32)
Calcium: 9.1 mg/dL (ref 8.9–10.3)
Chloride: 105 mmol/L (ref 98–111)
Creatinine, Ser: 0.77 mg/dL (ref 0.61–1.24)
GFR, Estimated: >60 mL/min (ref >60)
Glucose, Bld: 263 mg/dL — ABNORMAL HIGH (ref 70–99)
Potassium: 4 mmol/L (ref 3.5–5.1)
Sodium: 136 mmol/L (ref 135–145)

## 2022-02-16 SURGERY — IRRIGATION AND DEBRIDEMENT FOOT
Anesthesia: General | Site: Toe | Laterality: Right

## 2022-02-16 MED ORDER — CHLORHEXIDINE GLUCONATE 4 % EX LIQD
60.0000 mL | Freq: Once | CUTANEOUS | Status: AC
  Administered 2022-02-16: 4 via TOPICAL

## 2022-02-16 MED ORDER — LIDOCAINE HCL (PF) 2 % IJ SOLN
INTRAMUSCULAR | Status: AC
  Filled 2022-02-16: qty 5

## 2022-02-16 MED ORDER — PROPOFOL 10 MG/ML IV BOLUS
INTRAVENOUS | Status: DC | PRN
  Administered 2022-02-16: 50 mg via INTRAVENOUS
  Administered 2022-02-16: 150 mg via INTRAVENOUS

## 2022-02-16 MED ORDER — BUPIVACAINE HCL 0.5 % IJ SOLN
INTRAMUSCULAR | Status: DC | PRN
  Administered 2022-02-16: 10 mL

## 2022-02-16 MED ORDER — PROPOFOL 10 MG/ML IV BOLUS
INTRAVENOUS | Status: AC
  Filled 2022-02-16: qty 20

## 2022-02-16 MED ORDER — MIDAZOLAM HCL 2 MG/2ML IJ SOLN
INTRAMUSCULAR | Status: AC
  Filled 2022-02-16: qty 2

## 2022-02-16 MED ORDER — SODIUM CHLORIDE 0.9 % IR SOLN
Status: DC | PRN
  Administered 2022-02-16: 1000 mL

## 2022-02-16 MED ORDER — OXYCODONE HCL 5 MG/5ML PO SOLN: 5.0000 mg | Freq: Once | ORAL | Status: DC | PRN

## 2022-02-16 MED ORDER — LIDOCAINE HCL (PF) 1 % IJ SOLN
INTRAMUSCULAR | Status: AC
  Filled 2022-02-16: qty 30

## 2022-02-16 MED ORDER — LIDOCAINE-EPINEPHRINE 1 %-1:100000 IJ SOLN
INTRAMUSCULAR | Status: AC
  Filled 2022-02-16: qty 1

## 2022-02-16 MED ORDER — POVIDONE-IODINE 10 % EX SWAB
2.0000 | Freq: Once | CUTANEOUS | Status: AC
  Administered 2022-02-16: 2 via TOPICAL

## 2022-02-16 MED ORDER — LIDOCAINE HCL 1 % IJ SOLN
INTRAMUSCULAR | Status: DC | PRN
  Administered 2022-02-16: 10 mL

## 2022-02-16 MED ORDER — MIDAZOLAM HCL 2 MG/2ML IJ SOLN
INTRAMUSCULAR | Status: DC | PRN
  Administered 2022-02-16: 2 mg via INTRAVENOUS

## 2022-02-16 MED ORDER — OXYCODONE HCL 5 MG PO TABS: 5.0000 mg | ORAL_TABLET | Freq: Once | ORAL | Status: DC | PRN

## 2022-02-16 MED ORDER — FENTANYL CITRATE (PF) 100 MCG/2ML IJ SOLN
INTRAMUSCULAR | Status: AC
  Filled 2022-02-16: qty 2

## 2022-02-16 MED ORDER — SODIUM CHLORIDE 0.9 % IV SOLN: INTRAVENOUS | Status: DC

## 2022-02-16 MED ORDER — LIDOCAINE HCL (CARDIAC) PF 100 MG/5ML IV SOSY
PREFILLED_SYRINGE | INTRAVENOUS | Status: DC | PRN
  Administered 2022-02-16: 100 mg via INTRAVENOUS

## 2022-02-16 MED ORDER — BUPIVACAINE HCL (PF) 0.5 % IJ SOLN
INTRAMUSCULAR | Status: AC
  Filled 2022-02-16: qty 30

## 2022-02-16 MED ORDER — FENTANYL CITRATE (PF) 100 MCG/2ML IJ SOLN: 25.0000 ug | INTRAMUSCULAR | Status: DC | PRN

## 2022-02-16 MED ORDER — FENTANYL CITRATE (PF) 100 MCG/2ML IJ SOLN
INTRAMUSCULAR | Status: DC | PRN
  Administered 2022-02-16 (×2): 25 ug via INTRAVENOUS

## 2022-02-16 MED ORDER — ONDANSETRON HCL 4 MG/2ML IJ SOLN
INTRAMUSCULAR | Status: DC | PRN
  Administered 2022-02-16: 4 mg via INTRAVENOUS

## 2022-02-16 MED ORDER — CHLORHEXIDINE GLUCONATE CLOTH 2 % EX PADS
6.0000 | MEDICATED_PAD | Freq: Every day | CUTANEOUS | Status: DC
  Administered 2022-02-17: 6 via TOPICAL

## 2022-02-16 MED ORDER — ENOXAPARIN SODIUM 40 MG/0.4ML IJ SOSY
40.0000 mg | PREFILLED_SYRINGE | INTRAMUSCULAR | Status: DC
  Administered 2022-02-16: 40 mg via SUBCUTANEOUS
  Filled 2022-02-16: qty 0.4

## 2022-02-16 SURGICAL SUPPLY — 37 items
BLADE OSCILLATING/SAGITTAL (BLADE) ×2
BLADE SW THK.38XMED LNG THN (BLADE) IMPLANT
BNDG COHESIVE 4X5 TAN STRL LF (GAUZE/BANDAGES/DRESSINGS) ×2 IMPLANT
BNDG ELASTIC 4X5.8 VLCR STR LF (GAUZE/BANDAGES/DRESSINGS) ×2 IMPLANT
BNDG ESMARK 4X12 TAN STRL LF (GAUZE/BANDAGES/DRESSINGS) ×2 IMPLANT
BNDG GAUZE DERMACEA FLUFF 4 (GAUZE/BANDAGES/DRESSINGS) ×2 IMPLANT
BNDG STRETCH GAUZE 3IN X12FT (GAUZE/BANDAGES/DRESSINGS) ×2 IMPLANT
DRAPE FLUOR MINI C-ARM 54X84 (DRAPES) IMPLANT
ELECT REM PT RETURN 9FT ADLT (ELECTROSURGICAL) ×2
ELECTRODE REM PT RTRN 9FT ADLT (ELECTROSURGICAL) ×2 IMPLANT
GAUZE SPONGE 4X4 12PLY STRL (GAUZE/BANDAGES/DRESSINGS) ×2 IMPLANT
GAUZE XEROFORM 1X8 LF (GAUZE/BANDAGES/DRESSINGS) ×2 IMPLANT
GLOVE BIO SURGEON STRL SZ7.5 (GLOVE) ×2 IMPLANT
GLOVE SURG UNDER LTX SZ8 (GLOVE) ×2 IMPLANT
GOWN STRL REUS W/ TWL XL LVL3 (GOWN DISPOSABLE) ×4 IMPLANT
GOWN STRL REUS W/TWL MED LVL3 (GOWN DISPOSABLE) ×4 IMPLANT
GOWN STRL REUS W/TWL XL LVL3 (GOWN DISPOSABLE) ×4
IV NS 1000ML (IV SOLUTION) ×2
IV NS 1000ML BAXH (IV SOLUTION) ×2 IMPLANT
KIT TURNOVER KIT A (KITS) ×2 IMPLANT
MANIFOLD NEPTUNE II (INSTRUMENTS) ×2 IMPLANT
NDL FILTER BLUNT 18X1 1/2 (NEEDLE) ×2 IMPLANT
NDL HYPO 25X1 1.5 SAFETY (NEEDLE) ×2 IMPLANT
NEEDLE FILTER BLUNT 18X1 1/2 (NEEDLE) ×2 IMPLANT
NEEDLE HYPO 25X1 1.5 SAFETY (NEEDLE) ×2 IMPLANT
NS IRRIG 1000ML POUR BTL (IV SOLUTION) IMPLANT
PACK EXTREMITY ARMC (MISCELLANEOUS) ×2 IMPLANT
PAD ABD DERMACEA PRESS 5X9 (GAUZE/BANDAGES/DRESSINGS) ×2 IMPLANT
PULSAVAC PLUS IRRIG FAN TIP (DISPOSABLE) ×2
SOL PREP PVP 2OZ (MISCELLANEOUS) ×4
SOLUTION PREP PVP 2OZ (MISCELLANEOUS) ×2 IMPLANT
STOCKINETTE IMPERVIOUS 9X36 MD (GAUZE/BANDAGES/DRESSINGS) ×2 IMPLANT
SUT ETHILON 2 0 FS 18 (SUTURE) ×4 IMPLANT
SWAB CULTURE AMIES ANAERIB BLU (MISCELLANEOUS) IMPLANT
SYR 10ML LL (SYRINGE) ×4 IMPLANT
TIP FAN IRRIG PULSAVAC PLUS (DISPOSABLE) IMPLANT
TRAP FLUID SMOKE EVACUATOR (MISCELLANEOUS) ×2 IMPLANT

## 2022-02-16 NOTE — Inpatient Diabetes Management (Signed)
Inpatient Diabetes Program Recommendations  AACE/ADA: New Consensus Statement on Inpatient Glycemic Control (2015)  Target Ranges:  Prepandial:   less than 140 mg/dL      Peak postprandial:   less than 180 mg/dL (1-2 hours)      Critically ill patients:  140 - 180 mg/dL   Lab Results  Component Value Date   GLUCAP 251 (H) 02/16/2022   HGBA1C 13.1 (H) 02/14/2022    Review of Glycemic Control  Latest Reference Range & Units 02/15/22 08:20 02/15/22 11:47 02/15/22 16:58 02/15/22 21:36 02/15/22 22:10 02/16/22 08:26  Glucose-Capillary 70 - 99 mg/dL 093 (H) 267 (H) 124 (H) 229 (H) 260 (H) 251 (H)  (H): Data is abnormally high  Diabetes history: DM2 Outpatient Diabetes medications:  Levemir 35 units BID Humalog 8 units TID  Humalog 2-10 units TID  Metformin 1000 mg BID Current orders for Inpatient glycemic control:  Novolog 0-15 units TID Levemir 20 units QD  Inpatient Diabetes Program Recommendations:    NPO this morning for planned I&D of foot.  Please consider:  Levemir 35 units QD (50% of home dose) Novolog 4 units TID with meals if consumes at least 50%  Will continue to follow while inpatient.  Thank you, Dulce Sellar, MSN, CDCES Diabetes Coordinator Inpatient Diabetes Program [phone] (team pager from 8a-5p)

## 2022-02-16 NOTE — Transfer of Care (Signed)
Immediate Anesthesia Transfer of Care Note  Patient: Alvie Heidelberg  Procedure(s) Performed: IRRIGATION AND DEBRIDEMENT FOOT (Right: Foot)  Patient Location: PACU  Anesthesia Type:General  Level of Consciousness: drowsy  Airway & Oxygen Therapy: Patient Spontanous Breathing and Patient connected to face mask oxygen  Post-op Assessment: Report given to RN and Post -op Vital signs reviewed and stable  Post vital signs: Reviewed and stable  Last Vitals:  Vitals Value Taken Time  BP 137/76 02/16/22 1330  Temp 35.8 C 02/16/22 1330  Pulse 65 02/16/22 1334  Resp 20 02/16/22 1334  SpO2 100 % 02/16/22 1334  Vitals shown include unvalidated device data.  Last Pain:  Vitals:   02/16/22 1145  TempSrc:   PainSc: 0-No pain      Patients Stated Pain Goal: 3 (02/16/22 0837)  Complications: No notable events documented.

## 2022-02-16 NOTE — Op Note (Signed)
Operative note   Surgeon:Emina Ribaudo [employer] forces logistics/support/administrative officer: None    Preop diagnosis: Large abscess distal right forefoot    Postop diagnosis: Same    Procedure: 1.  Excision partial fifth ray right foot 2.  Partial excision third ray right foot 3.  Amputation right second toe 4.  Partial excision bone first metatarsal right foot 5.  Intraoperative fluoroscopy right foot    EBL: Minimal    Anesthesia:local and general.  Local consisted of a total of 10 cc of lidocaine plain and 10 cc of 0.5% bupivacaine plain    Hemostasis: Mid calf tourniquet inflated to 200 mmHg for approximately 25 minutes    Specimen: Deep wound culture right foot and remaining forefoot for pathology    Complications: None    Operative indications:Jared Castillo is an 71 y.o. that presents today for surgical intervention.  The risks/benefits/alternatives/complications have been discussed and consent has been given.    Procedure:  Patient was brought into the OR and placed on the operating table in thesupine position. After anesthesia was obtained theright lower extremity was prepped and draped in usual sterile fashion.  Attention was directed to the distal aspect of the right forefoot.  There was noted to be a large abscessed region around the right fifth MTPJ.  Incision was initially performed to the distal fifth MTPJ.  Severe purulent drainage was noted extending and coursing across the dorsal aspect and plantar aspect of the right forefoot around the fifth MTPJ into the fourth distal metatarsal region with necrotic tissue.  At this time the decision was made to remove the fifth ray and continue with further excision of bone to give him a better stable foot for ambulation in the forefoot the decision was made to remove the third ray second toe and distal first metatarsal to facilitate with wound closure as well.  At this time an osteotomy was created at the surgical neck of the fifth ray.  The osteotomy is also created at the  surgical neck of the third ray.  All toes and third and fifth metatarsals were then removed from the surgical field in toto.  At this time attempted closure of the wound was performed with the 2 flaps.  Still tension was noted to the medial aspect of the incision site.  I removed a small amount of the distal first metatarsal at this time to assist with wound closure.  Good perfusion was noted to the flaps.  Primary closure was at this time.  The wound was then flushed with copious amounts of irrigation with a pulse lavage.  All areas of necrotic tissue had been removed with the amputation.  No residual infected tissue was noted.  Closure of the surgical site was then performed with a 2-0 nylon.  A bulky sterile dressing was applied.  Intraoperative fluoroscopy was used both prior to the excision of bone and post excision to reveal the level of bone removed.  Bulky sterile dressing was applied.  He was taken to the PACU with the skin flaps intact.    Patient tolerated the procedure and anesthesia well.  Was transported from the OR to the PACU with all vital signs stable and vascular status intact. To be discharged per routine protocol.  Will follow up in approximately 1 week in the outpatient clinic.
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
## 2022-02-16 NOTE — Progress Notes (Signed)
Daily Progress Note   Subjective  - Day of Surgery  Follow-up right foot abscess.  Pain is improved.  On antibiotics currently.  Resting comfortably.  Objective Vitals:   02/15/22 1601 02/15/22 1652 02/15/22 2305 02/16/22 0747  BP: 117/72 134/71 128/83 130/80  Pulse: 76 76 78 70  Resp: 16 16 17    Temp: 97.8 F (36.6 C) 97.8 F (36.6 C) 97.9 F (36.6 C) 97.8 F (36.6 C)  TempSrc:      SpO2: 94% 98% 100% 97%  Weight:      Height:        Physical Exam: Noted purulent drainage still from the plantar lateral fifth MTPJ.  ABIs showed good waveforms.  Laboratory CBC    Component Value Date/Time   WBC 5.8 02/16/2022 0405   HGB 12.2 (L) 02/16/2022 0405   HGB 14.2 12/25/2013 1933   HCT 34.9 (L) 02/16/2022 0405   HCT 42.2 12/25/2013 1933   PLT 273 02/16/2022 0405   PLT 172 12/25/2013 1933    BMET    Component Value Date/Time   NA 136 02/16/2022 0405   NA 137 11/29/2012 0902   K 4.0 02/16/2022 0405   K 3.8 11/29/2012 0902   CL 105 02/16/2022 0405   CL 104 11/29/2012 0902   CO2 23 02/16/2022 0405   CO2 28 11/29/2012 0902   GLUCOSE 263 (H) 02/16/2022 0405   GLUCOSE 131 (H) 11/29/2012 0902   BUN 14 02/16/2022 0405   BUN 19 (H) 11/29/2012 0902   CREATININE 0.77 02/16/2022 0405   CREATININE 0.85 11/29/2012 0902   CALCIUM 9.1 02/16/2022 0405   CALCIUM 8.5 11/29/2012 0902   GFRNONAA >60 02/16/2022 0405   GFRNONAA >60 11/29/2012 0902   GFRAA >60 08/25/2019 0737   GFRAA >60 11/29/2012 0902    Assessment/Planning: Abscess right fifth metatarsophalangeal joint Diabetic foot infection right foot  Orders placed for surgery.  Plan for at minimum I&D with possible excision of fifth ray and conversion to transmetatarsal amputation.  Patient understands the risk and benefits with surgery.   12/01/2012 A  02/16/2022, 8:25 AM

## 2022-02-16 NOTE — Anesthesia Preprocedure Evaluation (Signed)
Anesthesia Evaluation  Patient identified by MRN, date of birth, ID band Patient awake    Reviewed: Allergy & Precautions, NPO status , Patient's Chart, lab work & pertinent test results  History of Anesthesia Complications Negative for: history of anesthetic complications  Airway Mallampati: III  TM Distance: >3 FB Neck ROM: full    Dental  (+) Missing   Pulmonary neg pulmonary ROS, neg shortness of breath, former smoker   Pulmonary exam normal        Cardiovascular Exercise Tolerance: Good hypertension, (-) angina Normal cardiovascular exam     Neuro/Psych negative neurological ROS  negative psych ROS   GI/Hepatic Neg liver ROS,GERD  Controlled,,  Endo/Other  negative endocrine ROSdiabetes, Type 2    Renal/GU Renal disease     Musculoskeletal   Abdominal   Peds  Hematology negative hematology ROS (+)   Anesthesia Other Findings Past Medical History: No date: Chronic kidney disease     Comment:  stones No date: Diabetes mellitus without complication (HCC) No date: GERD (gastroesophageal reflux disease) No date: Hypertension 12: Shingles rash     Comment:  dx yesterday 03/21/11  Past Surgical History: No date: APPENDECTOMY     Comment:  child 08/23/2019: IRRIGATION AND DEBRIDEMENT FOOT; Right     Comment:  Procedure: RIGHT FOOT IRRIGATION AND DEBRIDEMENT, RIGHT               ACHILLES TENDON LENGTHENING,EXCISION RIGHT FIRST               METATARSAL;  Surgeon: Gwyneth Revels, DPM;  Location:               [facility] ORS;  Service: Podiatry;  Laterality: Right; 79: TOE AMPUTATION     Comment:  rt great and second 03/30/2011: TOTAL KNEE ARTHROPLASTY     Comment:  Procedure: TOTAL KNEE ARTHROPLASTY;  Surgeon: Nadara Mustard, MD;  Location: [facility] OR;  Service: Orthopedics;                Laterality: Right;  Right Total Knee Arthroplastly  BMI    Body Mass Index: 26.65 kg/m       Reproductive/Obstetrics negative OB ROS                             Anesthesia Physical Anesthesia Plan  ASA: 3  Anesthesia Plan: General LMA   Post-op Pain Management:    Induction: Intravenous  PONV Risk Score and Plan: Dexamethasone, Ondansetron, Midazolam and Treatment may vary due to age or medical condition  Airway Management Planned: LMA  Additional Equipment:   Intra-op Plan:   Post-operative Plan: Extubation in OR  Informed Consent: I have reviewed the patients History and Physical, chart, labs and discussed the procedure including the risks, benefits and alternatives for the proposed anesthesia with the patient or authorized representative who has indicated his/her understanding and acceptance.     Dental Advisory Given  Plan Discussed with: Anesthesiologist, CRNA and Surgeon  Anesthesia Plan Comments: (Patient and family member consented for risks of anesthesia including but not limited to:  - adverse reactions to medications - damage to eyes, teeth, lips or other oral mucosa - nerve damage due to positioning  - sore throat or hoarseness - Damage to heart, brain, nerves, lungs, other parts of body or loss of life  They voiced understanding.)  Anesthesia Quick Evaluation  

## 2022-02-16 NOTE — Anesthesia Procedure Notes (Signed)
Procedure Name: LMA Insertion Date/Time: 02/16/2022 12:22 PM  Performed by: Hezzie Bump, CRNAPre-anesthesia Checklist: Patient identified, Patient being monitored, Timeout performed, Emergency Drugs available and Suction available Patient Re-evaluated:Patient Re-evaluated prior to induction Oxygen Delivery Method: Circle system utilized Preoxygenation: Pre-oxygenation with 100% oxygen Induction Type: IV induction Ventilation: Mask ventilation without difficulty LMA: LMA with gastric port inserted LMA Size: 4.0 Tube type: Oral Number of attempts: 1 Placement Confirmation: positive ETCO2 and breath sounds checked- equal and bilateral Tube secured with: Tape Dental Injury: Teeth and Oropharynx as per pre-operative assessment

## 2022-02-16 NOTE — Plan of Care (Signed)
  Problem: Education: Goal: Knowledge of General Education information will improve Description: Including pain rating scale, medication(s)/side effects and non-pharmacologic comfort measures Outcome: Progressing   Problem: Health Behavior/Discharge Planning: Goal: Ability to manage health-related needs will improve Outcome: Progressing   Problem: Clinical Measurements: Goal: Diagnostic test results will improve Outcome: Progressing Goal: Respiratory complications will improve Outcome: Progressing Goal: Cardiovascular complication will be avoided Outcome: Progressing   Problem: Activity: Goal: Risk for activity intolerance will decrease Outcome: Progressing   Problem: Nutrition: Goal: Adequate nutrition will be maintained Outcome: Progressing   Problem: Coping: Goal: Level of anxiety will decrease Outcome: Progressing   Problem: Elimination: Goal: Will not experience complications related to bowel motility Outcome: Progressing Goal: Will not experience complications related to urinary retention Outcome: Progressing

## 2022-02-16 NOTE — Plan of Care (Signed)

## 2022-02-16 NOTE — Anesthesia Postprocedure Evaluation (Signed)
Anesthesia Post Note  Patient: Jared Castillo  Procedure(s) Performed: IRRIGATION AND DEBRIDEMENT FOOT (Right: Foot) TRANSMETATARSAL AMPUTATION (Right: Toe)  Patient location during evaluation: PACU Anesthesia Type: General Level of consciousness: awake and alert Pain management: pain level controlled Vital Signs Assessment: post-procedure vital signs reviewed and stable Respiratory status: spontaneous breathing, nonlabored ventilation, respiratory function stable and patient connected to nasal cannula oxygen Cardiovascular status: blood pressure returned to baseline and stable Postop Assessment: no apparent nausea or vomiting Anesthetic complications: no   No notable events documented.   Last Vitals:  Vitals:   02/16/22 1408 02/16/22 1421  BP:  (!) 160/84  Pulse: 66 70  Resp: 13 18  Temp:    SpO2: 97% 98%    Last Pain:  Vitals:   02/16/22 1400  TempSrc:   PainSc: 0-No pain                 Cleda Mccreedy [place]

## 2022-02-16 NOTE — Hospital Course (Addendum)
Jared Castillo is a 71 y.o. 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 He presented from home to the ED on 02/14/2022 as recommended by his podiatrist with worsening right foot pain/erythema resistant to outpatient management. 11/13: In the ED, VSS. Significant findings included CT scan of the right foot with contrast which showed apparent interval increased soft tissue swelling laterally in the forefoot at the level of the 5th metatarsophalangeal joint with possible underlying small fluid collection. This could reflect cellulitis and an abscess. No CT evidence of adjacent osteomyelitis. Interval amputation through the 1st metatarsal shaft without evidence of recurrent osteomyelitis. Multiple other chronic posttraumatic and postsurgical deformities in the forefoot are stable. 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 He was initially treated with a dose of Zosyn and vancomycin. His podiatrist, Dr. Ether Griffins is following.  Patient was admitted to medicine service for further workup and management of chronic comorbidities  11/14: stable, pending surgical intervention w/ plan for at minimum I&D with possible excision of fifth ray and conversion to transmetatarsal amputation. Pt scheduled for procedure 11/15 11/15: surgery today as below. Plan follow in the office w/ podiatry in one week.   Consultants:  Podiatry   Procedures: 02/16/22 1.  Excision partial fifth ray right foot 2.  Partial excision third ray right foot 3.  Amputation right second toe 4.  Partial excision bone first metatarsal right foot 5.  Intraoperative fluoroscopy right foot       ASSESSMENT & PLAN:   Active Problems:   Cellulitis and abscess of toe of right foot   Uncontrolled type 2 diabetes mellitus with hyperglycemia, with long-term current use of insulin (HCC)   Essential hypertension    Poorly controlled diabetes mellitus (HCC)   Primary hypertension   Cellulitis and abscess of toe of right foot Rocephin and Flagyl Podiatry consult - planning I&D / amputation 02/16/2022  Uncontrolled type 2 diabetes mellitus with hyperglycemia, with long-term current use of insulin (HCC) Levemir 20 units daily sliding scale insulin Maintain consistent carbohydrate diet IV fluid hydration  Essential hypertension Continue hydrochlorothiazide, lisinopril and metoprolol   DVT prophylaxis: lovenox Pertinent IV fluids/nutrition: KVO Central lines / invasive devices: none  Code Status: FULL CODE Family Communication: wife at bedside   Disposition: home likely w/ home health TOC needs: pend PT/OT  Barriers to discharge / significant pending items: pain control, toelrating diet, ambulating. Anticipate will be appropriate for d/c tomorrow

## 2022-02-17 DIAGNOSIS — I10 Essential (primary) hypertension: Secondary | ICD-10-CM | POA: Diagnosis not present

## 2022-02-17 DIAGNOSIS — L02611 Cutaneous abscess of right foot: Secondary | ICD-10-CM | POA: Diagnosis not present

## 2022-02-17 DIAGNOSIS — L03031 Cellulitis of right toe: Secondary | ICD-10-CM | POA: Diagnosis not present

## 2022-02-17 DIAGNOSIS — E1165 Type 2 diabetes mellitus with hyperglycemia: Secondary | ICD-10-CM | POA: Diagnosis not present

## 2022-02-17 LAB — GLUCOSE, CAPILLARY
Glucose-Capillary: 191 mg/dL — ABNORMAL HIGH (ref 70–99)
Glucose-Capillary: 274 mg/dL — ABNORMAL HIGH (ref 70–99)

## 2022-02-17 MED ORDER — DOXYCYCLINE HYCLATE 100 MG PO TABS: 100.0000 mg | ORAL_TABLET | Freq: Two times a day (BID) | ORAL | 0 refills | Status: DC

## 2022-02-17 MED ORDER — IBUPROFEN 200 MG PO TABS: 400.0000 mg | ORAL_TABLET | Freq: Four times a day (QID) | ORAL | 0 refills | Status: DC | PRN

## 2022-02-17 MED ORDER — SALINE FLUSH 0.9 % IV SOLN: INTRAVENOUS | 0 refills | Status: DC

## 2022-02-17 MED ORDER — ADULT MULTIVITAMIN W/MINERALS CH: 1.0000 | ORAL_TABLET | Freq: Every day | ORAL | Status: AC

## 2022-02-17 MED ORDER — CIPROFLOXACIN HCL 500 MG PO TABS: 500.0000 mg | ORAL_TABLET | Freq: Two times a day (BID) | ORAL | 0 refills | Status: AC

## 2022-02-17 MED ORDER — HYDROCODONE-ACETAMINOPHEN 5-325 MG PO TABS: 1.0000 | ORAL_TABLET | Freq: Four times a day (QID) | ORAL | 0 refills | Status: AC | PRN

## 2022-02-17 NOTE — Progress Notes (Signed)
Daily Progress Note   Subjective  - 1 Day Post-Op  Follow-up right foot debridement with transmetatarsal amputation basically to the right foot at this time.  No complaints today.  He does have pain.  He prefers hydrocodone as the Percocet causes some irritability.  Objective Vitals:   02/16/22 1956 02/16/22 2322 02/17/22 0348 02/17/22 0746  BP: 122/76 124/80 109/76 (!) 142/80  Pulse: 72 72 69 72  Resp: 20 18 16    Temp: 97.6 F (36.4 C) 97.8 F (36.6 C) 98.1 F (36.7 C) 97.8 F (36.6 C)  TempSrc: Oral     SpO2: 94% 96% 97% 99%  Weight:      Height:        Physical Exam: The incision is well coapted.  The ulcerative site is open and allowing drainage from the area.  Just bloody drainage at this time.  No overt purulence.  Erythema to the surrounding incision site is much improved at this time.  Laboratory CBC    Component Value Date/Time   WBC 5.8 02/16/2022 0405   HGB 12.2 (L) 02/16/2022 0405   HGB 14.2 12/25/2013 1933   HCT 34.9 (L) 02/16/2022 0405   HCT 42.2 12/25/2013 1933   PLT 273 02/16/2022 0405   PLT 172 12/25/2013 1933    BMET    Component Value Date/Time   NA 136 02/16/2022 0405   NA 137 11/29/2012 0902   K 4.0 02/16/2022 0405   K 3.8 11/29/2012 0902   CL 105 02/16/2022 0405   CL 104 11/29/2012 0902   CO2 23 02/16/2022 0405   CO2 28 11/29/2012 0902   GLUCOSE 263 (H) 02/16/2022 0405   GLUCOSE 131 (H) 11/29/2012 0902   BUN 14 02/16/2022 0405   BUN 19 (H) 11/29/2012 0902   CREATININE 0.77 02/16/2022 0405   CREATININE 0.85 11/29/2012 0902   CALCIUM 9.1 02/16/2022 0405   CALCIUM 8.5 11/29/2012 0902   GFRNONAA >60 02/16/2022 0405   GFRNONAA >60 11/29/2012 0902   GFRAA >60 08/25/2019 0737   GFRAA >60 11/29/2012 0902    Assessment/Planning: Status post debridement with conversion to transmetatarsal amputation right forefoot for osteomyelitis and abscess  I discussed with the patient's family every other day dressing changes.  Discharge instructions  have been placed.  Patient should maintain nonweightbearing.  I recommended oral doxycycline and Cipro upon discharge.  We can follow and monitor the outpatient cultures and change as needed. Patient should follow-up with my office in 2 weeks upon discharge. From podiatry standpoint patient is stable for discharge.  12/01/2012 A  02/17/2022, 1:23 PM

## 2022-02-17 NOTE — Evaluation (Signed)
Physical Therapy Evaluation Patient Details Name: Jared Castillo MRN: [id number] DOB: 1951-01-04 Today's Date: 02/17/2022  History of Present Illness  Jared Castillo is a 71 y.o. 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  He rates his pain a 7 x 10 in intensity at its worst and denies any trauma.  He was seen in the emergency room at [facility] and was prescribed oral antibiotics and pain medication without any improvement in his symptoms.  He went to see his podiatrist and was referred to the ER for admission for possible amputation.  He denies having any fever or chills.  He denies having any chest pain, no shortness of breath, no abdominal pain, no urinary symptoms, no changes in his bowel habits, no dizziness, no lightheadedness, no headache, no blurred vision no focal deficit  He had a CT scan of the right foot with contrast which showed apparent interval increased soft tissue swelling laterally in the forefoot at the level of the 5th metatarsophalangeal joint with possible underlying small fluid collection. This could reflect cellulitis and an abscess. No CT evidence of adjacent osteomyelitis. Interval amputation through the 1st metatarsal shaft without evidence of recurrent osteomyelitis. Multiple other chronic posttraumatic and postsurgical deformities in the forefoot are stable. 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  He received a dose of Zosyn and vancomycin and will be admitted to the hospital for further evaluation  Clinical Impression  Pt is a pleasant 71 year old male who was admitted for cellulitis and abscess of R foot and is s/p TMA. Secure chat sent to MD to confirm NWB as pt was told PWB (heel only). MD  replied NWB on R foot. Pt performs transfers with cga and ambulation with min assist and crutches. Additional ambulation performed with RW, however pt fatigues quicker and feels more confident using the crutches. Will need crutches for safe return to home. Pt demonstrates deficits with strength/pain/mobility/balance. Wife at bedside confident in providing assistance at home. Would benefit from skilled PT to address above deficits and promote optimal return to PLOF. Recommend transition to [place] upon discharge from acute hospitalization.       Recommendations for follow up therapy are one component of a multi-disciplinary discharge planning process, led by the attending physician.  Recommendations may be updated based on patient status, additional functional criteria and insurance authorization.  Follow Up Recommendations Home health PT      Assistance Recommended at Discharge Set up Supervision/Assistance  Patient can return home with the following  A little help with walking and/or transfers;Assist for transportation;Help with stairs or ramp for entrance    Equipment Recommendations Crutches (transport chair for long distances. Also asking for RW if insurance will pay. Definitely wants the crutches though)  Recommendations for Other Services       Functional Status Assessment Patient has had a recent decline in their functional status and demonstrates the ability to make significant improvements in function in a reasonable and predictable amount of time.     Precautions / Restrictions Precautions Precautions: Fall Restrictions Weight Bearing Restrictions: Yes RLE Weight Bearing: Non weight bearing      Mobility  Bed Mobility               General bed mobility comments: NT, received  up in chair    Transfers Overall transfer level: Needs assistance Equipment used: Crutches Transfers: Sit to/from Stand Sit to Stand: Min guard           General transfer comment: has  difficulty managing balance without B UE support. Therapist gave crutches once standing. Able to maintain correct WBing status during static standing    Ambulation/Gait Ambulation/Gait assistance: Min assist Gait Distance (Feet): 10 Feet Assistive device: Crutches         General Gait Details: hop-to pattern using B crutches. Able to complete turns with safe technique. Further mobility performed with RW  Stairs            Wheelchair Mobility    Modified Rankin (Stroke Patients Only)       Balance Overall balance assessment: Modified Independent                                           Pertinent Vitals/Pain Pain Assessment Pain Assessment: 0-10 Pain Score: 7  Pain Location: R foot Pain Descriptors / Indicators: Operative site guarding Pain Intervention(s): Limited activity within patient's tolerance, Premedicated before session    Home Living Family/patient expects to be discharged to:: Private residence Living Arrangements: Spouse/significant other Available Help at Discharge: Family;Available 24 hours/day Type of Home: Mobile home Home Access: Ramped entrance       Home Layout: One level Home Equipment: Cane - single point;Toilet riser      Prior Function Prior Level of Function : Independent/Modified Independent             Mobility Comments: indep with SPC PRN ADLs Comments: indep     Hand Dominance        Extremity/Trunk Assessment   Upper Extremity Assessment Upper Extremity Assessment: Overall WFL for tasks assessed    Lower Extremity Assessment Lower Extremity Assessment: Generalized weakness (limited by pain)       Communication   Communication: No difficulties  Cognition Arousal/Alertness: Awake/alert Behavior During Therapy: WFL for tasks assessed/performed Overall Cognitive Status: Within Functional Limits for tasks assessed                                          General Comments       Exercises Other Exercises Other Exercises: ambulated additional 10' with RW with CGA with ability to hop on L LE. Fatigues quickly and reports walker doesn't fit in home. Prefers to use crutches   Assessment/Plan    PT Assessment Patient needs continued PT services  PT Problem List Decreased strength;Decreased balance;Decreased mobility;Pain       PT Treatment Interventions Gait training;DME instruction;Therapeutic exercise;Balance training    PT Goals (Current goals can be found in the Care Plan section)  Acute Rehab PT Goals Patient Stated Goal: to go home PT Goal Formulation: With patient Time For Goal Achievement: 03/03/22 Potential to Achieve Goals: Good    Frequency Min 2X/week     Co-evaluation               AM-PAC PT "6 Clicks" Mobility  Outcome Measure Help needed turning from your back to your side while in a flat bed without using bedrails?: A Little Help needed moving from lying on your back to sitting on the side of a flat bed without using  bedrails?: A Little Help needed moving to and from a bed to a chair (including a wheelchair)?: A Little Help needed standing up from a chair using your arms (e.g., wheelchair or bedside chair)?: A Little Help needed to walk in hospital room?: A Little Help needed climbing 3-5 steps with a railing? : A Lot 6 Click Score: 17    End of Session Equipment Utilized During Treatment: Gait belt Activity Tolerance: Patient limited by pain Patient left: in chair;with family/visitor present Nurse Communication: Mobility status PT Visit Diagnosis: Unsteadiness on feet (R26.81);Muscle weakness (generalized) (M62.81);Difficulty in walking, not elsewhere classified (R26.2);Pain Pain - Right/Left: Right Pain - part of body: Ankle and joints of foot    Time: [id number] PT Time Calculation (min) (ACUTE ONLY): 28 min   Charges:   PT Evaluation $PT Eval Low Complexity: 1 Low PT Treatments $Gait Training: 8-22 mins         Greggory Stallion, PT, DPT, [facility] [phone]   Jared Castillo 02/17/2022, 11:03 AM

## 2022-02-17 NOTE — Plan of Care (Signed)
Problem: Education: Goal: Knowledge of General Education information will improve Description: Including pain rating scale, medication(s)/side effects and non-pharmacologic comfort measures Outcome: Adequate for Discharge   Problem: Health Behavior/Discharge Planning: Goal: Ability to manage health-related needs will improve Outcome: Adequate for Discharge   Problem: Clinical Measurements: Goal: Ability to maintain clinical measurements within normal limits will improve Outcome: Adequate for Discharge Goal: Will remain free from infection Outcome: Adequate for Discharge Goal: Diagnostic test results will improve Outcome: Adequate for Discharge Goal: Respiratory complications will improve Outcome: Adequate for Discharge Goal: Cardiovascular complication will be avoided Outcome: Adequate for Discharge   Problem: Activity: Goal: Risk for activity intolerance will decrease Outcome: Adequate for Discharge   Problem: Nutrition: Goal: Adequate nutrition will be maintained Outcome: Adequate for Discharge   Problem: Coping: Goal: Level of anxiety will decrease Outcome: Adequate for Discharge   Problem: Elimination: Goal: Will not experience complications related to bowel motility Outcome: Adequate for Discharge Goal: Will not experience complications related to urinary retention Outcome: Adequate for Discharge   Problem: Pain Managment: Goal: General experience of comfort will improve Outcome: Adequate for Discharge   Problem: Safety: Goal: Ability to remain free from injury will improve Outcome: Adequate for Discharge   Problem: Skin Integrity: Goal: Risk for impaired skin integrity will decrease Outcome: Adequate for Discharge   Problem: Education: Goal: Knowledge of General Education information will improve Description: Including pain rating scale, medication(s)/side effects and non-pharmacologic comfort measures Outcome: Adequate for Discharge   Problem: Health  Behavior/Discharge Planning: Goal: Ability to manage health-related needs will improve Outcome: Adequate for Discharge   Problem: Clinical Measurements: Goal: Ability to maintain clinical measurements within normal limits will improve Outcome: Adequate for Discharge Goal: Will remain free from infection Outcome: Adequate for Discharge Goal: Diagnostic test results will improve Outcome: Adequate for Discharge Goal: Respiratory complications will improve Outcome: Adequate for Discharge Goal: Cardiovascular complication will be avoided Outcome: Adequate for Discharge   Problem: Activity: Goal: Risk for activity intolerance will decrease Outcome: Adequate for Discharge   Problem: Nutrition: Goal: Adequate nutrition will be maintained Outcome: Adequate for Discharge   Problem: Coping: Goal: Level of anxiety will decrease Outcome: Adequate for Discharge   Problem: Elimination: Goal: Will not experience complications related to bowel motility Outcome: Adequate for Discharge Goal: Will not experience complications related to urinary retention Outcome: Adequate for Discharge   Problem: Pain Managment: Goal: General experience of comfort will improve Outcome: Adequate for Discharge   Problem: Safety: Goal: Ability to remain free from injury will improve Outcome: Adequate for Discharge   Problem: Skin Integrity: Goal: Risk for impaired skin integrity will decrease Outcome: Adequate for Discharge   Problem: Education: Goal: Knowledge of General Education information will improve Description: Including pain rating scale, medication(s)/side effects and non-pharmacologic comfort measures Outcome: Adequate for Discharge   Problem: Health Behavior/Discharge Planning: Goal: Ability to manage health-related needs will improve Outcome: Adequate for Discharge   Problem: Clinical Measurements: Goal: Ability to maintain clinical measurements within normal limits will  improve Outcome: Adequate for Discharge Goal: Will remain free from infection Outcome: Adequate for Discharge Goal: Diagnostic test results will improve Outcome: Adequate for Discharge Goal: Respiratory complications will improve Outcome: Adequate for Discharge Goal: Cardiovascular complication will be avoided Outcome: Adequate for Discharge   Problem: Activity: Goal: Risk for activity intolerance will decrease Outcome: Adequate for Discharge   Problem: Nutrition: Goal: Adequate nutrition will be maintained Outcome: Adequate for Discharge   Problem: Coping: Goal: Level of anxiety will decrease Outcome: Adequate for   Discharge   Problem: Elimination: Goal: Will not experience complications related to bowel motility Outcome: Adequate for Discharge Goal: Will not experience complications related to urinary retention Outcome: Adequate for Discharge   Problem: Pain Managment: Goal: General experience of comfort will improve Outcome: Adequate for Discharge   Problem: Safety: Goal: Ability to remain free from injury will improve Outcome: Adequate for Discharge   Problem: Skin Integrity: Goal: Risk for impaired skin integrity will decrease Outcome: Adequate for Discharge   

## 2022-02-17 NOTE — Progress Notes (Signed)
Patient suffers from impaired mobility and amputation which impairs their ability to perform daily activities like ADLsi n the home.  A walking aide will not resolve issue with performing activities of daily living. A wheelchair will allow patient to safely perform daily activities. Patient is not able to propel themselves in the home using a standard weight wheelchair due to weakness. Patient can self propel in the lightweight wheelchair. Length of need 6 months Accessories: elevating leg rests (ELRs), wheel locks, extensions and anti-tippers.back cushion
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
## 2022-02-17 NOTE — Progress Notes (Addendum)
Met with the patient  His wife was in the room also He lives at home with his wife that is his caregiver she stated that she is ceritifed and does his wound care He stated that he was open with [facility] before and wanted to use them again, I notified Corene Cornea at [facility], they will not be able to accept the patient due to his Ins I reached out to [facility] and am awaiting a response He will need a Transport chair and crutches, [facility] to deliver

## 2022-02-17 NOTE — TOC Progression Note (Signed)
Transition of Care [facility]) - Progression Note    Patient Details  Name: Jared Castillo MRN: [id number] Date of Birth: 19-Sep-1950  Transition of Care [facility]) CM/SW Contact  Marlowe Sax, RN Phone Number: 02/17/2022, 2:25 PM  Clinical Narrative:     Frances Furbish accepted the patient for [facility]  Expected Discharge Plan: Home w Home Health Services Barriers to Discharge: Continued Medical Work up  Expected Discharge Plan and Services Expected Discharge Plan: Home w Home Health Services   Discharge Planning Services: CM Consult   Living arrangements for the past 2 months: Single Family Home Expected Discharge Date: 02/17/22               DME Arranged: [place] [facility], 3-N-1         HH Arranged: PT HH Agency: [facility] Date HH Agency Contacted: 02/17/22 Time HH Agency Contacted: 1155 Representative spoke with at [facility] Agency: Barbara Cower   Social Determinants of Health (SDOH) Interventions    Readmission Risk Interventions     No data to display

## 2022-02-17 NOTE — Discharge Instructions (Signed)
Dressing orders: Dressing to be performed every other day.  Remove dressing and cleanse with saline.  Paint incision with Betadine and cover with gauze, gauze wrap and Ace wrap.  Maintain nonweightbearing.  Use crutches.

## 2022-02-17 NOTE — Inpatient Diabetes Management (Signed)
Inpatient Diabetes Program Recommendations  AACE/ADA: New Consensus Statement on Inpatient Glycemic Control (2015)  Target Ranges:  Prepandial:   less than 140 mg/dL      Peak postprandial:   less than 180 mg/dL (1-2 hours)      Critically ill patients:  140 - 180 mg/dL   Lab Results  Component Value Date   GLUCAP 274 (H) 02/17/2022   HGBA1C 13.1 (H) 02/14/2022    Latest Reference Range & Units 02/17/22 07:48 02/17/22 11:38  Glucose-Capillary 70 - 99 mg/dL 767 (H) 341 (H)  (H): Data is abnormally high  Review of Glycemic Control Diabetes history: DM2 Outpatient Diabetes medications:  Levemir 35 units BID Humalog 8 units TID  Humalog 2-10 units TID  Metformin 1000 mg BID Current orders for Inpatient glycemic control:  Novolog 0-15 units TID Levemir 20 units QD  Inpatient Diabetes Program Recommendations:    Please consider:  Novolog 3 units TID with meals if consumes at least 50%  Will continue to follow while inpatient.  Thank you, Dulce Sellar, MSN, CDCES Diabetes Coordinator Inpatient Diabetes Program [phone] (team pager from 8a-5p)

## 2022-02-18 LAB — SURGICAL PATHOLOGY

## 2022-02-19 LAB — CULTURE, BLOOD (ROUTINE X 2)
Culture: NO GROWTH
Culture: NO GROWTH
Special Requests: ADEQUATE

## 2022-02-21 LAB — AEROBIC/ANAEROBIC CULTURE W GRAM STAIN (SURGICAL/DEEP WOUND)
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
## 2022-10-07 ENCOUNTER — Inpatient Hospital Stay
Admission: EM | Admit: 2022-10-07 | Discharge: 2022-10-12 | DRG: 617 | Disposition: A | Discharge status: Home / Self Care | Payer: Medicare Other | Attending: Internal Medicine | Admitting: Internal Medicine

## 2022-10-07 ENCOUNTER — Emergency Department: Payer: Medicare Other

## 2022-10-07 ENCOUNTER — Other Ambulatory Visit: Payer: Self-pay

## 2022-10-07 DIAGNOSIS — Z89422 Acquired absence of other left toe(s): Secondary | ICD-10-CM | POA: Diagnosis not present

## 2022-10-07 DIAGNOSIS — E871 Hypo-osmolality and hyponatremia: Secondary | ICD-10-CM | POA: Diagnosis not present

## 2022-10-07 DIAGNOSIS — E876 Hypokalemia: Secondary | ICD-10-CM | POA: Diagnosis not present

## 2022-10-07 DIAGNOSIS — M86072 Acute hematogenous osteomyelitis, left ankle and foot: Secondary | ICD-10-CM | POA: Diagnosis not present

## 2022-10-07 DIAGNOSIS — L03032 Cellulitis of left toe: Secondary | ICD-10-CM | POA: Diagnosis present

## 2022-10-07 DIAGNOSIS — L97529 Non-pressure chronic ulcer of other part of left foot with unspecified severity: Secondary | ICD-10-CM | POA: Diagnosis present

## 2022-10-07 DIAGNOSIS — Z794 Long term (current) use of insulin: Secondary | ICD-10-CM | POA: Diagnosis not present

## 2022-10-07 DIAGNOSIS — Y929 Unspecified place or not applicable: Secondary | ICD-10-CM | POA: Diagnosis not present

## 2022-10-07 DIAGNOSIS — Z89421 Acquired absence of other right toe(s): Secondary | ICD-10-CM | POA: Diagnosis not present

## 2022-10-07 DIAGNOSIS — L97528 Non-pressure chronic ulcer of other part of left foot with other specified severity: Secondary | ICD-10-CM | POA: Diagnosis not present

## 2022-10-07 DIAGNOSIS — L03116 Cellulitis of left lower limb: Secondary | ICD-10-CM | POA: Diagnosis present

## 2022-10-07 DIAGNOSIS — E1140 Type 2 diabetes mellitus with diabetic neuropathy, unspecified: Secondary | ICD-10-CM | POA: Diagnosis not present

## 2022-10-07 DIAGNOSIS — L02612 Cutaneous abscess of left foot: Secondary | ICD-10-CM | POA: Diagnosis present

## 2022-10-07 DIAGNOSIS — M869 Osteomyelitis, unspecified: Secondary | ICD-10-CM

## 2022-10-07 DIAGNOSIS — Z888 Allergy status to other drugs, medicaments and biological substances status: Secondary | ICD-10-CM | POA: Diagnosis not present

## 2022-10-07 DIAGNOSIS — S90112A Contusion of left great toe without damage to nail, initial encounter: Secondary | ICD-10-CM

## 2022-10-07 DIAGNOSIS — I70245 Atherosclerosis of native arteries of left leg with ulceration of other part of foot: Secondary | ICD-10-CM | POA: Diagnosis not present

## 2022-10-07 DIAGNOSIS — E1165 Type 2 diabetes mellitus with hyperglycemia: Secondary | ICD-10-CM

## 2022-10-07 DIAGNOSIS — Z9889 Other specified postprocedural states: Secondary | ICD-10-CM | POA: Diagnosis not present

## 2022-10-07 DIAGNOSIS — Z5941 Food insecurity: Secondary | ICD-10-CM | POA: Diagnosis not present

## 2022-10-07 DIAGNOSIS — I10 Essential (primary) hypertension: Secondary | ICD-10-CM | POA: Diagnosis not present

## 2022-10-07 DIAGNOSIS — B9562 Methicillin resistant Staphylococcus aureus infection as the cause of diseases classified elsewhere: Secondary | ICD-10-CM | POA: Diagnosis present

## 2022-10-07 DIAGNOSIS — E11621 Type 2 diabetes mellitus with foot ulcer: Secondary | ICD-10-CM | POA: Diagnosis not present

## 2022-10-07 DIAGNOSIS — S91302A Unspecified open wound, left foot, initial encounter: Secondary | ICD-10-CM

## 2022-10-07 DIAGNOSIS — Z89412 Acquired absence of left great toe: Secondary | ICD-10-CM | POA: Diagnosis not present

## 2022-10-07 DIAGNOSIS — Z96651 Presence of right artificial knee joint: Secondary | ICD-10-CM | POA: Diagnosis present

## 2022-10-07 DIAGNOSIS — Z87891 Personal history of nicotine dependence: Secondary | ICD-10-CM

## 2022-10-07 DIAGNOSIS — Z79899 Other long term (current) drug therapy: Secondary | ICD-10-CM | POA: Diagnosis not present

## 2022-10-07 DIAGNOSIS — L02619 Cutaneous abscess of unspecified foot: Secondary | ICD-10-CM | POA: Diagnosis present

## 2022-10-07 DIAGNOSIS — W208XXA Other cause of strike by thrown, projected or falling object, initial encounter: Secondary | ICD-10-CM | POA: Diagnosis present

## 2022-10-07 DIAGNOSIS — Z885 Allergy status to narcotic agent status: Secondary | ICD-10-CM | POA: Diagnosis not present

## 2022-10-07 DIAGNOSIS — K219 Gastro-esophageal reflux disease without esophagitis: Secondary | ICD-10-CM | POA: Diagnosis present

## 2022-10-07 DIAGNOSIS — M8618 Other acute osteomyelitis, other site: Secondary | ICD-10-CM | POA: Diagnosis not present

## 2022-10-07 DIAGNOSIS — I739 Peripheral vascular disease, unspecified: Secondary | ICD-10-CM | POA: Diagnosis not present

## 2022-10-07 DIAGNOSIS — Z7984 Long term (current) use of oral hypoglycemic drugs: Secondary | ICD-10-CM | POA: Diagnosis not present

## 2022-10-07 DIAGNOSIS — L089 Local infection of the skin and subcutaneous tissue, unspecified: Secondary | ICD-10-CM | POA: Diagnosis not present

## 2022-10-07 DIAGNOSIS — E08621 Diabetes mellitus due to underlying condition with foot ulcer: Secondary | ICD-10-CM | POA: Diagnosis not present

## 2022-10-07 DIAGNOSIS — L97402 Non-pressure chronic ulcer of unspecified heel and midfoot with fat layer exposed: Secondary | ICD-10-CM | POA: Diagnosis not present

## 2022-10-07 DIAGNOSIS — E872 Acidosis, unspecified: Secondary | ICD-10-CM | POA: Diagnosis not present

## 2022-10-07 DIAGNOSIS — E1151 Type 2 diabetes mellitus with diabetic peripheral angiopathy without gangrene: Secondary | ICD-10-CM | POA: Diagnosis not present

## 2022-10-07 DIAGNOSIS — M86172 Other acute osteomyelitis, left ankle and foot: Secondary | ICD-10-CM | POA: Diagnosis not present

## 2022-10-07 LAB — BASIC METABOLIC PANEL
Anion gap: 12 (ref 5–15)
BUN: 22 mg/dL (ref 8–23)
CO2: 26 mmol/L (ref 22–32)
Calcium: 9.2 mg/dL (ref 8.9–10.3)
Chloride: 90 mmol/L — ABNORMAL LOW (ref 98–111)
Creatinine, Ser: 1.2 mg/dL (ref 0.61–1.24)
GFR, Estimated: >60 mL/min (ref >60)
Glucose, Bld: 432 mg/dL — ABNORMAL HIGH (ref 70–99)
Potassium: 4.1 mmol/L (ref 3.5–5.1)
Sodium: 128 mmol/L — ABNORMAL LOW (ref 135–145)

## 2022-10-07 LAB — CBC
HCT: 38.4 % — ABNORMAL LOW (ref 39.0–52.0)
Hemoglobin: 13.4 g/dL (ref 13.0–17.0)
MCH: 29.8 pg (ref 26.0–34.0)
MCHC: 34.9 g/dL (ref 30.0–36.0)
MCV: 85.3 fL (ref 80.0–100.0)
Platelets: 345 10*3/uL (ref 150–400)
RBC: 4.5 MIL/uL (ref 4.22–5.81)
RDW: 12.5 % (ref 11.5–15.5)
WBC: 10.7 10*3/uL — ABNORMAL HIGH (ref 4.0–10.5)
nRBC: 0 % (ref 0.0–0.2)

## 2022-10-07 LAB — GLUCOSE, CAPILLARY: Glucose-Capillary: 393 mg/dL — ABNORMAL HIGH (ref 70–99)

## 2022-10-07 LAB — SEDIMENTATION RATE: Sed Rate: 74 mm/hr — ABNORMAL HIGH (ref 0–20)

## 2022-10-07 LAB — LACTIC ACID, PLASMA: Lactic Acid, Venous: 2.1 mmol/L (ref 0.5–1.9)

## 2022-10-07 MED ORDER — ACETAMINOPHEN 325 MG PO TABS: 650.0000 mg | ORAL_TABLET | Freq: Four times a day (QID) | ORAL | Status: DC | PRN

## 2022-10-07 MED ORDER — ONDANSETRON HCL 4 MG PO TABS: 4.0000 mg | ORAL_TABLET | Freq: Four times a day (QID) | ORAL | Status: DC | PRN

## 2022-10-07 MED ORDER — FLUTICASONE PROPIONATE 50 MCG/ACT NA SUSP
2.0000 | Freq: Every day | NASAL | Status: DC
  Administered 2022-10-08 – 2022-10-12 (×5): 2 via NASAL
  Filled 2022-10-07: qty 16

## 2022-10-07 MED ORDER — PIPERACILLIN-TAZOBACTAM 3.375 G IVPB 30 MIN
3.3750 g | Freq: Once | INTRAVENOUS | Status: AC
  Administered 2022-10-07: 3.375 g via INTRAVENOUS
  Filled 2022-10-07: qty 50

## 2022-10-07 MED ORDER — ONDANSETRON HCL 4 MG/2ML IJ SOLN: 4.0000 mg | Freq: Four times a day (QID) | INTRAMUSCULAR | Status: DC | PRN

## 2022-10-07 MED ORDER — SODIUM CHLORIDE 0.9 % IV BOLUS
500.0000 mL | Freq: Once | INTRAVENOUS | Status: AC
  Administered 2022-10-07: 500 mL via INTRAVENOUS

## 2022-10-07 MED ORDER — HYDROMORPHONE HCL 1 MG/ML IJ SOLN
0.5000 mg | Freq: Once | INTRAMUSCULAR | Status: AC
  Administered 2022-10-07: 0.5 mg via INTRAVENOUS
  Filled 2022-10-07: qty 0.5

## 2022-10-07 MED ORDER — ROSUVASTATIN CALCIUM 10 MG PO TABS
40.0000 mg | ORAL_TABLET | Freq: Every day | ORAL | Status: DC
  Administered 2022-10-08 – 2022-10-12 (×5): 40 mg via ORAL
  Filled 2022-10-07 (×6): qty 4

## 2022-10-07 MED ORDER — ALBUTEROL SULFATE HFA 108 (90 BASE) MCG/ACT IN AERS: 1.0000 | INHALATION_SPRAY | Freq: Four times a day (QID) | RESPIRATORY_TRACT | Status: DC | PRN

## 2022-10-07 MED ORDER — ALBUTEROL SULFATE (2.5 MG/3ML) 0.083% IN NEBU
2.5000 mg | INHALATION_SOLUTION | RESPIRATORY_TRACT | Status: DC | PRN
  Filled 2022-10-07: qty 3

## 2022-10-07 MED ORDER — ENOXAPARIN SODIUM 40 MG/0.4ML IJ SOSY
40.0000 mg | PREFILLED_SYRINGE | INTRAMUSCULAR | Status: DC
  Administered 2022-10-08 – 2022-10-11 (×5): 40 mg via SUBCUTANEOUS
  Filled 2022-10-07 (×5): qty 0.4

## 2022-10-07 MED ORDER — VANCOMYCIN HCL IN DEXTROSE 1-5 GM/200ML-% IV SOLN
1000.0000 mg | Freq: Once | INTRAVENOUS | Status: AC
  Administered 2022-10-07: 1000 mg via INTRAVENOUS
  Filled 2022-10-07: qty 200

## 2022-10-07 MED ORDER — INSULIN ASPART 100 UNIT/ML IJ SOLN
0.0000 [IU] | Freq: Three times a day (TID) | INTRAMUSCULAR | Status: DC
  Administered 2022-10-08: 3 [IU] via SUBCUTANEOUS
  Administered 2022-10-08: 8 [IU] via SUBCUTANEOUS
  Administered 2022-10-08: 3 [IU] via SUBCUTANEOUS
  Administered 2022-10-09: 5 [IU] via SUBCUTANEOUS
  Administered 2022-10-09: 3 [IU] via SUBCUTANEOUS
  Administered 2022-10-09: 8 [IU] via SUBCUTANEOUS
  Administered 2022-10-10: 5 [IU] via SUBCUTANEOUS
  Administered 2022-10-10: 8 [IU] via SUBCUTANEOUS
  Administered 2022-10-10: 3 [IU] via SUBCUTANEOUS
  Administered 2022-10-11: 5 [IU] via SUBCUTANEOUS
  Administered 2022-10-11 – 2022-10-12 (×3): 2 [IU] via SUBCUTANEOUS
  Administered 2022-10-12: 8 [IU] via SUBCUTANEOUS
  Filled 2022-10-07 (×13): qty 1

## 2022-10-07 MED ORDER — VANCOMYCIN HCL 1750 MG/350ML IV SOLN: 1750.0000 mg | INTRAVENOUS | Status: DC

## 2022-10-07 MED ORDER — METOPROLOL SUCCINATE ER 50 MG PO TB24
50.0000 mg | ORAL_TABLET | Freq: Every day | ORAL | Status: DC
  Administered 2022-10-08 – 2022-10-11 (×4): 50 mg via ORAL
  Filled 2022-10-07 (×5): qty 1

## 2022-10-07 MED ORDER — GABAPENTIN 300 MG PO CAPS
600.0000 mg | ORAL_CAPSULE | Freq: Two times a day (BID) | ORAL | Status: DC
  Administered 2022-10-08 – 2022-10-12 (×10): 600 mg via ORAL
  Filled 2022-10-07 (×11): qty 2

## 2022-10-07 MED ORDER — SODIUM CHLORIDE 0.9 % IV SOLN: 2.0000 g | Freq: Three times a day (TID) | INTRAVENOUS | Status: DC

## 2022-10-07 MED ORDER — INSULIN ASPART 100 UNIT/ML IJ SOLN
0.0000 [IU] | Freq: Every day | INTRAMUSCULAR | Status: DC
  Administered 2022-10-08: 3 [IU] via SUBCUTANEOUS
  Administered 2022-10-08: 5 [IU] via SUBCUTANEOUS
  Administered 2022-10-09: 4 [IU] via SUBCUTANEOUS
  Administered 2022-10-10: 3 [IU] via SUBCUTANEOUS
  Administered 2022-10-11: 4 [IU] via SUBCUTANEOUS
  Filled 2022-10-07 (×5): qty 1

## 2022-10-07 MED ORDER — ACETAMINOPHEN 650 MG RE SUPP: 650.0000 mg | Freq: Four times a day (QID) | RECTAL | Status: DC | PRN

## 2022-10-07 MED ORDER — SODIUM CHLORIDE 0.9 % IV SOLN
2.0000 g | Freq: Three times a day (TID) | INTRAVENOUS | Status: DC
  Administered 2022-10-08 – 2022-10-10 (×8): 2 g via INTRAVENOUS
  Filled 2022-10-07 (×10): qty 12.5

## 2022-10-07 MED ORDER — VANCOMYCIN HCL 10 G IV SOLR: 1.0000 g | Freq: Once | INTRAVENOUS | Status: DC

## 2022-10-07 MED ORDER — PANTOPRAZOLE SODIUM 40 MG PO TBEC
40.0000 mg | DELAYED_RELEASE_TABLET | Freq: Every day | ORAL | Status: DC
  Administered 2022-10-08 – 2022-10-12 (×5): 40 mg via ORAL
  Filled 2022-10-07 (×6): qty 1

## 2022-10-07 MED ORDER — LISINOPRIL 10 MG PO TABS
10.0000 mg | ORAL_TABLET | Freq: Every day | ORAL | Status: DC
  Administered 2022-10-08 – 2022-10-09 (×2): 10 mg via ORAL
  Filled 2022-10-07 (×2): qty 1

## 2022-10-07 MED ORDER — INSULIN DETEMIR 100 UNIT/ML ~~LOC~~ SOLN
20.0000 [IU] | Freq: Two times a day (BID) | SUBCUTANEOUS | Status: DC
  Administered 2022-10-07 – 2022-10-08 (×3): 20 [IU] via SUBCUTANEOUS
  Filled 2022-10-07 (×4): qty 0.2

## 2022-10-07 NOTE — Assessment & Plan Note (Deleted)
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  CRP and ESR on the higher side.  Patient on vancomycin and cefepime.

## 2022-10-07 NOTE — Progress Notes (Signed)
Pharmacy Antibiotic Note  Jared Castillo is a 72 y.o. male admitted on 10/07/2022 with cellulitis.  Pharmacy has been consulted for Vanc, Cefepime dosing.  Plan: Zosyn 3.375 gm IV X 1 over 30 min given in ED on 7/5 @ 2014. Cefepime 2 gm IV Q8H ordered to start on 7/6 @ 0200.  Vanc 1 gm IV X 1 given in ED on 7/5 @ 2038.  Additional Vanc 1 gm IV X 1 ordered to make total loading dose of 2 gm. Vancomycin 1750 mg IV Q24H ordered to start on 7/6 @ 2100.  AUC = 505 Vanc trough = 11  Height: 6\' 1"  (185.4 cm) Weight: 83.9 kg (185 lb) IBW/kg (Calculated) : 79.9  Temp (24hrs), Avg:97.8 F (36.6 C), Min:97.8 F (36.6 C), Max:97.8 F (36.6 C)  Recent Labs  Lab 10/07/22 1547 10/07/22 1942  WBC 10.7*  --   CREATININE 1.20  --   LATICACIDVEN  --  2.1*    Estimated Creatinine Clearance: 63.8 mL/min (by C-G formula based on SCr of 1.2 mg/dL).    Allergies  Allergen Reactions   Trazodone Other (See Comments)    Nightmares    Antimicrobials this admission:   >>    >>   Dose adjustments this admission:   Microbiology results:  BCx:   UCx:    Sputum:    MRSA PCR:   Thank you for allowing pharmacy to be a part of this patient's care.  Endy Easterly D 10/07/2022 10:23 PM

## 2022-10-07 NOTE — ED Provider Notes (Signed)
[facility] Provider Note    Event Date/Time   First MD Initiated Contact with Patient 10/07/22 1919     (approximate)   History   Foot Injury   HPI  Jared Castillo is a 72 y.o. male with a history of type 2 diabetes, hypertension, and peripheral artery disease who presents with concern for an infection to his left foot.  The patient states that he works at [employer] and a metal shelf fell onto his foot 4 days ago while he was moving it.  He subsequently had a wound to the top of the large toe and the webspace between the first and second toes.  He was seen at [facility] after this.  Subsequently he developed redness and swelling to the toe and end of the foot.  He was seen by podiatry today and referred to the ED for IV antibiotics and admission.  I reviewed the past medical records.  The patient was admitted in November of last year for right foot cellulitis.   Physical Exam   Triage Vital Signs: ED Triage Vitals  Enc Vitals Group     BP 10/07/22 1545 108/79     Pulse Rate 10/07/22 1545 81     Resp 10/07/22 1545 17     Temp 10/07/22 1545 97.8 F (36.6 C)     Temp Source 10/07/22 1545 Oral     SpO2 10/07/22 1545 99 %     Weight 10/07/22 1546 185 lb (83.9 kg)     Height 10/07/22 1546 6\' 1"  (1.854 m)     Head Circumference --      Peak Flow --      Pain Score 10/07/22 1546 8     Pain Loc --      Pain Edu? --      Excl. in GC? --     Most recent vital signs: Vitals:   10/07/22 2100 10/07/22 2237  BP: 106/68 135/85  Pulse: 73 69  Resp: 19 20  Temp:  97.8 F (36.6 C)  SpO2: 97% 99%     General: Awake, no distress.  CV:  Good peripheral perfusion.  Resp:  Normal effort.  Abd:  No distention.  Other:  Superficial appearing wound to the dorsum of the great toe with a central eschar.  Surrounding erythema, induration, and warmth to the base of the toe and distal half of the foot dorsally.  No erythema or induration to the ankle or distal  leg.  Full range of motion all toes.  2+ DP pulse.   ED Results / Procedures / Treatments   Labs (all labs ordered are listed, but only abnormal results are displayed) Labs Reviewed  CBC - Abnormal; Notable for the following components:      Result Value   WBC 10.7 (*)    HCT 38.4 (*)    All other components within normal limits  BASIC METABOLIC PANEL - Abnormal; Notable for the following components:   Sodium 128 (*)    Chloride 90 (*)    Glucose, Bld 432 (*)    All other components within normal limits  LACTIC ACID, PLASMA - Abnormal; Notable for the following components:   Lactic Acid, Venous 2.1 (*)    All other components within normal limits  SEDIMENTATION RATE - Abnormal; Notable for the following components:   Sed Rate 74 (*)    All other components within normal limits  GLUCOSE, CAPILLARY - Abnormal; Notable for the following components:  Glucose-Capillary 393 (*)    All other components within normal limits  CULTURE, BLOOD (ROUTINE X 2)  CULTURE, BLOOD (ROUTINE X 2)  LACTIC ACID, PLASMA  C-REACTIVE PROTEIN  HEMOGLOBIN A1C  CBC     EKG     RADIOLOGY  XR L foot: I independently viewed and interpreted the images.  There is no evidence of acute fracture.  Radiology report indicates soft tissues swelling with no bony erosion.   PROCEDURES:  Critical Care performed: No  Procedures   MEDICATIONS ORDERED IN ED: Medications  enoxaparin (LOVENOX) injection 40 mg (has no administration in time range)  acetaminophen (TYLENOL) tablet 650 mg (has no administration in time range)    Or  acetaminophen (TYLENOL) suppository 650 mg (has no administration in time range)  ondansetron (ZOFRAN) tablet 4 mg (has no administration in time range)    Or  ondansetron (ZOFRAN) injection 4 mg (has no administration in time range)  insulin aspart (novoLOG) injection 0-5 Units (has no administration in time range)  insulin detemir (LEVEMIR) injection 20 Units (20 Units  Subcutaneous Given 10/07/22 2337)  insulin aspart (novoLOG) injection 0-15 Units (has no administration in time range)  albuterol (PROVENTIL) (2.5 MG/3ML) 0.083% nebulizer solution 2.5 mg (has no administration in time range)  vancomycin (VANCOCIN) IVPB 1000 mg/200 mL premix (1,000 mg Intravenous New Bag/Given 10/07/22 2343)  vancomycin (VANCOREADY) IVPB 1750 mg/350 mL (has no administration in time range)  ceFEPIme (MAXIPIME) 2 g in sodium chloride 0.9 % 100 mL IVPB (has no administration in time range)  fluticasone (FLONASE) 50 MCG/ACT nasal spray 2 spray (has no administration in time range)  gabapentin (NEURONTIN) capsule 600 mg (has no administration in time range)  metoprolol succinate (TOPROL-XL) 24 hr tablet 50 mg (has no administration in time range)  pantoprazole (PROTONIX) EC tablet 40 mg (has no administration in time range)  rosuvastatin (CRESTOR) tablet 40 mg (has no administration in time range)  lisinopril (ZESTRIL) tablet 10 mg (has no administration in time range)  piperacillin-tazobactam (ZOSYN) IVPB 3.375 g (0 g Intravenous Stopped 10/07/22 2038)  vancomycin (VANCOCIN) IVPB 1000 mg/200 mL premix (0 mg Intravenous Stopped 10/07/22 2141)  HYDROmorphone (DILAUDID) injection 0.5 mg (0.5 mg Intravenous Given 10/07/22 2019)  sodium chloride 0.9 % bolus 500 mL (500 mLs Intravenous New Bag/Given 10/07/22 2140)     IMPRESSION / MDM / ASSESSMENT AND PLAN / ED COURSE  I reviewed the triage vital signs and the nursing notes.  72 year old male with PMH as noted above presents with pain, redness, and swelling to the left great toe and distal foot after a shelf fell on it 4 days ago.  He was referred from podiatry.  Differential diagnosis includes, but is not limited to, cellulitis, less likely deep soft tissue infection, osteomyelitis.  X-rays do not show evidence of bony erosion.  Initial labs reveal mild leukocytosis and hyponatremia.  We will obtain lactate, cultures, ESR and CRP, and give  vancomycin and Zosyn due to the patient's diabetes history.  I anticipate inpatient admission.  Patient's presentation is most consistent with acute presentation with potential threat to life or bodily function.  ----------------------------------------- 10:10 PM on 10/07/2022 -----------------------------------------  Lactate is minimally elevated.  ESR is elevated.  I consulted Dr. Para March from the hospitalist service; based on our discussion she agrees to evaluate the patient for admission.  FINAL CLINICAL IMPRESSION(S) / ED DIAGNOSES   Final diagnoses:  Cellulitis of left foot     Rx / DC Orders   ED Discharge  Orders     None        Note:  This document was prepared using Dragon voice recognition software and may include unintentional dictation errors.    Dionne Bucy, MD 10/08/22 0001

## 2022-10-07 NOTE — ED Triage Notes (Signed)
Pt sts that he was at work and a metal shelf fell on pt foot. Pt [facility] who advised him to come to the ED for concern of Osteomyelitis.

## 2022-10-07 NOTE — Assessment & Plan Note (Addendum)
Diabetic neuropathy Pseudohyponatremia IV fluids Hold metformin Continue basal insulin Sliding scale insulin coverage Continue gabapentin

## 2022-10-07 NOTE — Assessment & Plan Note (Signed)
Suspect related to metformin but will monitor closely for sepsis IV fluids and monitor for improvement

## 2022-10-07 NOTE — Assessment & Plan Note (Signed)
Continue metoprolol and lisinopril without HCTZ

## 2022-10-07 NOTE — H&P (Signed)
History and Physical    Patient: Jared Castillo ZOX:[id number] DOB: 1950-09-26 DOA: 10/07/2022 DOS: the patient was seen and examined on 10/07/2022 PCP: Verner Mould, MD  Patient coming from: Home  Chief Complaint:  Chief Complaint  Patient presents with   Foot Injury    HPI: Jared Castillo is a 72 y.o. 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  Patient started having redness, swelling, skin breakdown and increased pain of the foot prompting him to see his podiatrist.  He denies fever or chills. ED course and data review: Vitals within normal limits.  Labs with WBC 10,700 and lactic acid 2.1.  Sed rate 74.  Glucose 432 with sodium 128. X-ray showed soft tissue swelling of the first ray without erosive changes. Patient started on Zosyn and vancomycin, given Dilaudid for pain. Hospitalist consulted for admission.   Review of Systems: As mentioned in the history of present illness. All other systems reviewed and are negative.  Past Medical History:  Diagnosis Date   Chronic kidney disease    stones   Diabetes mellitus without complication (HCC)    GERD (gastroesophageal reflux disease)    Hypertension    Shingles rash 13   dx yesterday 03/21/11   Past Surgical History:  Procedure Laterality Date   APPENDECTOMY     child   IRRIGATION AND DEBRIDEMENT FOOT Right 08/23/2019   Procedure: RIGHT FOOT IRRIGATION AND DEBRIDEMENT, RIGHT ACHILLES TENDON LENGTHENING,EXCISION RIGHT FIRST METATARSAL;  Surgeon: Gwyneth Revels, DPM;  Location: [facility] OR[facility];  Service: Podiatry;  Laterality: Right;   IRRIGATION AND DEBRIDEMENT FOOT Right 02/16/2022   Procedure: IRRIGATION AND  DEBRIDEMENT FOOT;  Surgeon: Gwyneth Revels, DPM;  Location: [facility] OR[facility];  Service: Podiatry;  Laterality: Right;   TOE AMPUTATION  79   rt great and second   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 He has never used smokeless tobacco. He reports current alcohol use of about 2.0 - 3.0 standard drinks of alcohol per week. He reports that he does not use drugs.  Allergies  Allergen Reactions   Oxycodone     Other Reaction(s): Hallucinations   Oxycodone-Acetaminophen Other (See Comments)    delusions  Other Reaction(s): Other   Trazodone Other (See Comments)    Nightmares    No family history on file.  Prior to Admission medications   Medication Sig Start Date End Date Taking? Authorizing Provider  albuterol (VENTOLIN HFA) 108 (90 Base) MCG/ACT inhaler Inhale 1-2 puffs into the lungs every 6 (six) hours as needed for wheezing or shortness of breath.    [provider]  doxycycline (VIBRA-TABS) 100 MG tablet Take 1 tablet (100 mg total) by mouth 2 (two) times daily. Take first dose evening 02/17/22 and start twice daily dosing tomorrow 02/18/22 02/17/22   Sunnie Nielsen, DO  fluticasone (VERAMYST) 27.5 MCG/SPRAY nasal spray Place  2 sprays into the nose daily.    [provider]  gabapentin (NEURONTIN) 300 MG capsule Take 300 mg by mouth 3 (three) times daily. 2 caps TID    [provider]  ibuprofen (ADVIL) 200 MG tablet Take 2 tablets (400 mg total) by mouth every 6 (six) hours as needed for moderate pain or mild pain. 02/17/22   Sunnie Nielsen, DO  insulin detemir (LEVEMIR)  100 UNIT/ML injection Inject 35 Units into the skin 2 (two) times daily.    [provider]  insulin lispro (HUMALOG) 100 UNIT/ML injection Inject 8 Units into the skin 3 (three) times daily before meals.    [provider]  insulin lispro (HUMALOG) 100 UNIT/ML KwikPen Inject 2-10 Units into the skin 3 (three) times daily. 01/15/22   [provider]  lisinopril-hydrochlorothiazide (ZESTORETIC) 20-25 MG tablet Take 0.5 tablets by mouth daily.    [provider]  metFORMIN (GLUCOPHAGE) 1000 MG tablet Take 1,000 mg by mouth 2 (two) times daily with a meal.    [provider]  metoprolol succinate (TOPROL-XL) 50 MG 24 hr tablet Take 50 mg by mouth daily. Take with or immediately following a meal.    [provider]  Multiple Vitamin (MULTIVITAMIN WITH MINERALS) TABS tablet Take 1 tablet by mouth daily. 02/18/22   Sunnie Nielsen, DO  omeprazole (PRILOSEC) 40 MG capsule Take 20 mg by mouth 2 (two) times daily.    [provider]  rosuvastatin (CRESTOR) 40 MG tablet Take 40 mg by mouth daily.    [provider]  Sodium Chloride Flush (SALINE FLUSH) 0.9 % SOLN As needed for dressing changes 02/17/22   Sunnie Nielsen, DO    Physical Exam: Vitals:   10/07/22 1930 10/07/22 2000 10/07/22 2030 10/07/22 2100  BP: 112/74 119/72 124/76 106/68  Pulse: 74 72 78 73  Resp: (!) 22 16 20 19   Temp:      TempSrc:      SpO2: 100% 100% 98% 97%  Weight:      Height:       Physical Exam Vitals and nursing note reviewed.  Constitutional:      General: He is not in acute distress. HENT:     Head: Normocephalic and atraumatic.  Cardiovascular:     Rate and Rhythm: Normal rate and regular rhythm.     Heart sounds: Normal heart sounds.  Pulmonary:     Effort: Pulmonary effort is normal.     Breath sounds: Normal breath sounds.  Abdominal:     Palpations: Abdomen is soft.     Tenderness: There is no abdominal tenderness.   Musculoskeletal:     Comments: See pictures below  Neurological:     Mental Status: Mental status is at baseline.        Labs on Admission: I have personally reviewed following labs and imaging studies  CBC: Recent Labs  Lab 10/07/22 1547  WBC 10.7*  HGB 13.4  HCT 38.4*  MCV 85.3  PLT 345   Basic Metabolic Panel: Recent Labs  Lab 10/07/22 1547  NA 128*  K 4.1  CL 90*  CO2 26  GLUCOSE 432*  BUN 22  CREATININE 1.20  CALCIUM 9.2   GFR: Estimated Creatinine Clearance: 63.8 mL/min (by C-G formula based on SCr of 1.2 mg/dL). Liver Function Tests: No results for input(s): "AST", "ALT", "ALKPHOS", "BILITOT", "PROT", "ALBUMIN" in the last 168 hours. No results for input(s): "LIPASE", "AMYLASE" in the last 168 hours. No results for input(s): "  AMMONIA" in the last 168 hours. Coagulation Profile: No results for input(s): "INR", "PROTIME" in the last 168 hours. Cardiac Enzymes: No results for input(s): "CKTOTAL", "CKMB", "CKMBINDEX", "TROPONINI" in the last 168 hours. BNP (last 3 results) No results for input(s): "PROBNP" in the last 8760 hours. HbA1C: No results for input(s): "HGBA1C" in the last 72 hours. CBG: No results for input(s): "GLUCAP" in the last 168 hours. Lipid Profile: No results for input(s): "CHOL", "HDL", "LDLCALC", "TRIG", "CHOLHDL", "LDLDIRECT" in the last 72 hours. Thyroid Function Tests: No results for input(s): "TSH", "T4TOTAL", "FREET4", "T3FREE", "THYROIDAB" in the last 72 hours. Anemia Panel: No results for input(s): "VITAMINB12", "FOLATE", "FERRITIN", "TIBC", "IRON", "RETICCTPCT" in the last 72 hours. Urine analysis:    Component Value Date/Time   COLORURINE STRAW (A) 01/16/2017 1642   APPEARANCEUR CLEAR (A) 01/16/2017 1642   APPEARANCEUR Clear 11/29/2012 1049   LABSPEC 1.025 01/16/2017 1642   LABSPEC 1.027 11/29/2012 1049   PHURINE 5.0 01/16/2017 1642   GLUCOSEU >=500 (A) 01/16/2017 1642   GLUCOSEU Negative 11/29/2012 1049   HGBUR  SMALL (A) 01/16/2017 1642   BILIRUBINUR NEGATIVE 01/16/2017 1642   BILIRUBINUR Negative 11/29/2012 1049   KETONESUR NEGATIVE 01/16/2017 1642   PROTEINUR NEGATIVE 01/16/2017 1642   NITRITE NEGATIVE 01/16/2017 1642   LEUKOCYTESUR SMALL (A) 01/16/2017 1642   LEUKOCYTESUR Trace 11/29/2012 1049    Radiological Exams on Admission: DG Foot Complete Left  Result Date: 10/07/2022 CLINICAL DATA:  Infection. EXAM: LEFT FOOT - COMPLETE 3 VIEW COMPARISON:  None Available. FINDINGS: No fracture or dislocation. Slight hypertrophic changes of the first metatarsophalangeal joint. Preserved bone mineralization. No definite erosive changes. Vascular calcifications are seen. Soft tissue swelling about the first ray. The AP view is limited by motion. Overall if there is further concern of infection including bone infection recommend additional evaluation for a penetrating ulcer down to bone as well as possible MRI or bone scan for further sensitivity. IMPRESSION: Soft tissue swelling of the first ray without erosive changes. Motion on the AP view. Electronically Signed   By: Karen Kays M.D.   On: 10/07/2022 16:12     Data Reviewed: Relevant notes from primary care and specialist visits, past discharge summaries as available in EHR, including Care Everywhere. 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 Patient has normal vitals, elevated WBC and lactic acidosis to 2.1 and infection Chest x-ray showing soft tissue edema only however sed rate elevated to  74 Continue to monitor vitals in case meeting sepsis criteria Elevated lactic acid could be related to metformin which we will hold IV hydration Cefepime and vancomycin Podiatry consult to determine further diagnostic evaluation  Patient had ABIs November 2023 that were normal   Uncontrolled type 2 diabetes mellitus with hyperglycemia, with long-term current use of insulin (HCC) Diabetic neuropathy Pseudohyponatremia IV fluids Hold metformin Continue basal insulin Sliding scale insulin coverage Continue gabapentin  Essential hypertension Continue metoprolol and lisinopril without HCTZ  Lactic acidosis Suspect related to metformin but will monitor closely for sepsis IV fluids and monitor for improvement      DVT prophylaxis: Lovenox  Consults: Podiatry Dr. Ether Griffins  Advance Care Planning:   Code Status: Prior   Family Communication: none  Disposition Plan: Back to previous home environment  Severity of Illness: The appropriate patient status for this patient is INPATIENT. Inpatient status is judged to be reasonable and necessary in order to provide the required intensity of service to ensure the patient's safety. The patient's presenting symptoms, physical exam findings, and initial radiographic and laboratory data in the context of their chronic comorbidities is felt to place them at high risk for further clinical deterioration. Furthermore, it is not anticipated that the patient will be medically stable for discharge from the hospital within 2 midnights of admission.   * I certify that at the point of admission it is my clinical judgment that the patient will require inpatient hospital care spanning beyond 2 midnights from the point of admission due to high intensity of service, high risk for further deterioration and high frequency of surveillance required.*  Author: Andris Baumann, MD 10/07/2022 9:49 PM  For on call review [email or web address].

## 2022-10-08 ENCOUNTER — Encounter: Payer: Self-pay | Admitting: Internal Medicine

## 2022-10-08 ENCOUNTER — Inpatient Hospital Stay: Payer: PRIVATE HEALTH INSURANCE

## 2022-10-08 DIAGNOSIS — S91302A Unspecified open wound, left foot, initial encounter: Secondary | ICD-10-CM | POA: Diagnosis not present

## 2022-10-08 DIAGNOSIS — E871 Hypo-osmolality and hyponatremia: Secondary | ICD-10-CM

## 2022-10-08 DIAGNOSIS — E1165 Type 2 diabetes mellitus with hyperglycemia: Secondary | ICD-10-CM | POA: Diagnosis not present

## 2022-10-08 DIAGNOSIS — E876 Hypokalemia: Secondary | ICD-10-CM

## 2022-10-08 DIAGNOSIS — L03116 Cellulitis of left lower limb: Secondary | ICD-10-CM

## 2022-10-08 DIAGNOSIS — I10 Essential (primary) hypertension: Secondary | ICD-10-CM | POA: Diagnosis not present

## 2022-10-08 LAB — SURGICAL PCR SCREEN
MRSA, PCR: POSITIVE — AB
Staphylococcus aureus: POSITIVE — AB

## 2022-10-08 LAB — BASIC METABOLIC PANEL
Anion gap: 9 (ref 5–15)
BUN: 17 mg/dL (ref 8–23)
CO2: 28 mmol/L (ref 22–32)
Calcium: 8.8 mg/dL — ABNORMAL LOW (ref 8.9–10.3)
Chloride: 94 mmol/L — ABNORMAL LOW (ref 98–111)
Creatinine, Ser: 1.03 mg/dL (ref 0.61–1.24)
GFR, Estimated: >60 mL/min (ref >60)
Glucose, Bld: 252 mg/dL — ABNORMAL HIGH (ref 70–99)
Potassium: 3.2 mmol/L — ABNORMAL LOW (ref 3.5–5.1)
Sodium: 131 mmol/L — ABNORMAL LOW (ref 135–145)

## 2022-10-08 LAB — CBC
HCT: 31.1 % — ABNORMAL LOW (ref 39.0–52.0)
Hemoglobin: 11 g/dL — ABNORMAL LOW (ref 13.0–17.0)
MCH: 29.9 pg (ref 26.0–34.0)
MCHC: 35.4 g/dL (ref 30.0–36.0)
MCV: 84.5 fL (ref 80.0–100.0)
Platelets: 259 10*3/uL (ref 150–400)
RBC: 3.68 MIL/uL — ABNORMAL LOW (ref 4.22–5.81)
RDW: 12.4 % (ref 11.5–15.5)
WBC: 8.4 10*3/uL (ref 4.0–10.5)
nRBC: 0 % (ref 0.0–0.2)

## 2022-10-08 LAB — GLUCOSE, CAPILLARY
Glucose-Capillary: 177 mg/dL — ABNORMAL HIGH (ref 70–99)
Glucose-Capillary: 256 mg/dL — ABNORMAL HIGH (ref 70–99)
Glucose-Capillary: 198 mg/dL — ABNORMAL HIGH (ref 70–99)
Glucose-Capillary: 259 mg/dL — ABNORMAL HIGH (ref 70–99)

## 2022-10-08 LAB — C-REACTIVE PROTEIN: CRP: 9.9 mg/dL — ABNORMAL HIGH (ref <1.0)

## 2022-10-08 LAB — LACTIC ACID, PLASMA: Lactic Acid, Venous: 1.5 mmol/L (ref 0.5–1.9)

## 2022-10-08 LAB — CULTURE, BLOOD (ROUTINE X 2)

## 2022-10-08 LAB — HEMOGLOBIN A1C
Hgb A1c MFr Bld: 12.4 % — ABNORMAL HIGH (ref 4.8–5.6)
Mean Plasma Glucose: 309.18 mg/dL

## 2022-10-08 MED ORDER — POVIDONE-IODINE 10 % EX SWAB: 2.0000 | Freq: Once | CUTANEOUS | Status: DC

## 2022-10-08 MED ORDER — MUPIROCIN 2 % EX OINT
1.0000 | TOPICAL_OINTMENT | Freq: Two times a day (BID) | CUTANEOUS | Status: DC
  Administered 2022-10-08 – 2022-10-12 (×8): 1 via NASAL
  Filled 2022-10-08: qty 22

## 2022-10-08 MED ORDER — CHLORHEXIDINE GLUCONATE 4 % EX SOLN
60.0000 mL | Freq: Once | CUTANEOUS | Status: AC
  Administered 2022-10-08: 4 via TOPICAL

## 2022-10-08 MED ORDER — HYDROCODONE-ACETAMINOPHEN 5-325 MG PO TABS
1.0000 | ORAL_TABLET | ORAL | Status: DC | PRN
  Administered 2022-10-08 – 2022-10-11 (×11): 2 via ORAL
  Administered 2022-10-11: 1 via ORAL
  Administered 2022-10-12 (×2): 2 via ORAL
  Filled 2022-10-08: qty 1
  Filled 2022-10-08 (×15): qty 2

## 2022-10-08 MED ORDER — ACETAMINOPHEN 325 MG PO TABS: 650.0000 mg | ORAL_TABLET | Freq: Four times a day (QID) | ORAL | Status: DC | PRN

## 2022-10-08 MED ORDER — MORPHINE SULFATE (PF) 2 MG/ML IV SOLN
2.0000 mg | INTRAVENOUS | Status: DC | PRN
  Administered 2022-10-08 – 2022-10-09 (×4): 2 mg via INTRAVENOUS
  Filled 2022-10-08 (×4): qty 1

## 2022-10-08 MED ORDER — VANCOMYCIN HCL IN DEXTROSE 1-5 GM/200ML-% IV SOLN
1000.0000 mg | Freq: Two times a day (BID) | INTRAVENOUS | Status: DC
  Administered 2022-10-08 – 2022-10-11 (×8): 1000 mg via INTRAVENOUS
  Filled 2022-10-08 (×9): qty 200

## 2022-10-08 MED ORDER — POTASSIUM CHLORIDE CRYS ER 20 MEQ PO TBCR
40.0000 meq | EXTENDED_RELEASE_TABLET | Freq: Once | ORAL | Status: AC
  Administered 2022-10-08: 40 meq via ORAL
  Filled 2022-10-08: qty 2

## 2022-10-08 MED ORDER — ACETAMINOPHEN 650 MG RE SUPP: 650.0000 mg | Freq: Four times a day (QID) | RECTAL | Status: DC | PRN

## 2022-10-08 NOTE — Progress Notes (Signed)
Mobility Specialist - Progress Note   10/08/22 0929  Mobility  Activity Ambulated independently in hallway;Stood at bedside;Dangled on edge of bed  Level of Assistance Independent  Assistive Device None  Distance Ambulated (ft) 240 ft  Activity Response Tolerated well  Mobility Referral Yes  $Mobility charge 1 Mobility  Mobility Specialist Start Time (ACUTE ONLY) 0907  Mobility Specialist Stop Time (ACUTE ONLY) 0915  Mobility Specialist Time Calculation (min) (ACUTE ONLY) 8 min   Pt supine in bed on RA upon arrival. Pt STS and ambulates in hallway indep with no LOB noted. Pt returns to bed with needs in reach.   Terrilyn Saver  Mobility Specialist  10/08/22 9:31 AM

## 2022-10-08 NOTE — Plan of Care (Signed)
  Problem: Skin Integrity: Goal: Skin integrity will improve Outcome: Progressing   Problem: Pain Managment: Goal: General experience of comfort will improve Outcome: Progressing   Problem: Safety: Goal: Ability to remain free from injury will improve Outcome: Progressing

## 2022-10-08 NOTE — Assessment & Plan Note (Signed)
Potentially pseudohyponatremia with elevated sugars versus hyponatremia secondary to hydrochlorothiazide.

## 2022-10-08 NOTE — Plan of Care (Signed)

## 2022-10-08 NOTE — TOC Progression Note (Signed)
Transition of Care [facility]) - Progression Note    Patient Details  Name: Jared Castillo MRN: [id number] Date of Birth: 20-Nov-1950  Transition of Care [facility]) CM/SW Contact  Bing Quarry, RN Phone Number: 10/08/2022, 10:16 AM  Clinical Narrative:  10/08/22: SDOH risks for housing and utilities identified and resources added via Care Management.  Gabriel Cirri MSN RN CM  Transitions of Care Department [facility] [phone] Weekends Only          Expected Discharge Plan and Services                                               Social Determinants of Health (SDOH) Interventions SDOH Screenings   Food Insecurity: Food Insecurity Present (10/07/2022)  Housing: Low Risk  (10/07/2022)  Transportation Needs: No Transportation Needs (10/07/2022)  Utilities: At Risk (10/07/2022)  Tobacco Use: Medium Risk (10/08/2022)    Readmission Risk Interventions     No data to display

## 2022-10-08 NOTE — Assessment & Plan Note (Signed)
Replaced 

## 2022-10-08 NOTE — Consult Note (Signed)
ORTHOPAEDIC CONSULTATION  REQUESTING PHYSICIAN: Alford Highland, MD  Chief Complaint: Left foot infection  HPI: Jared Castillo is a 72 y.o. male who complains of worsening redness swelling and drainage to his left foot.  Longstanding history of diabetes with neuropathy.  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  Admitted with worsening redness cellulitis and area of superficial necrosis of skin to the left great toe.  He states he has had pain in this area of recent.  Past Medical History:  Diagnosis Date   Chronic kidney disease    stones   Diabetes mellitus without complication (HCC)    GERD (gastroesophageal reflux disease)    Hypertension    Shingles rash 13   dx yesterday 03/21/11   Past Surgical History:  Procedure Laterality Date   APPENDECTOMY     child   IRRIGATION AND DEBRIDEMENT FOOT Right 08/23/2019   Procedure: RIGHT FOOT IRRIGATION AND DEBRIDEMENT, RIGHT ACHILLES TENDON LENGTHENING,EXCISION RIGHT FIRST METATARSAL;  Surgeon: Gwyneth Revels, DPM;  Location: [facility] ORS;  Service: Podiatry;  Laterality: Right;   IRRIGATION AND DEBRIDEMENT FOOT Right 02/16/2022   Procedure: IRRIGATION AND DEBRIDEMENT FOOT;  Surgeon: Gwyneth Revels, DPM;  Location: [facility] ORS;  Service: Podiatry;  Laterality: Right;   TOE AMPUTATION  79   rt great and second   TOTAL KNEE ARTHROPLASTY  03/30/2011   Procedure: TOTAL KNEE ARTHROPLASTY;  Surgeon: Nadara Mustard, MD;  Location: [facility] OR;  Service: Orthopedics;  Laterality: Right;  Right Total Knee Arthroplastly   TRANSMETATARSAL AMPUTATION Right 02/16/2022   Procedure: TRANSMETATARSAL AMPUTATION;  Surgeon: Gwyneth Revels, DPM;  Location: [facility] ORS;  Service: Podiatry;  Laterality: Right;   Social History   Socioeconomic History   Marital status: Married    Spouse name: Not on file   Number of children: Not on file   Years of education: Not on file   Highest education level: Not on file   Occupational History   Not on file  Tobacco Use   Smoking status: Former   Smokeless tobacco: Never  Vaping Use   Vaping Use: Never used  Substance and Sexual Activity   Alcohol use: Not Currently    Alcohol/week: 2.0 - 3.0 standard drinks of alcohol    Types: 2 - 3 Cans of beer per week    Comment: no alcohol in a year   Drug use: No   Sexual activity: Not on file  Other Topics Concern   Not on file  Social History Narrative   Not on file   Social Determinants of Health   Financial Resource Strain: Not on file  Food Insecurity: Food Insecurity Present (10/07/2022)   Hunger Vital Sign    Worried About Running Out of Food in the Last Year: Sometimes true    Ran Out of Food in the Last Year: Sometimes true  Transportation Needs: No Transportation Needs (10/07/2022)   PRAPARE - Administrator, Civil Service (Medical): No    Lack of Transportation (Non-Medical): No  Physical Activity: Not on file  Stress: Not on file  Social Connections: Not on file   No family history on file. Allergies  Allergen Reactions   Trazodone Other (See Comments)    Nightmares   Prior to Admission medications   Medication Sig Start Date End Date Taking? Authorizing Provider  albuterol (VENTOLIN HFA) 108 (90 Base) MCG/ACT inhaler Inhale 1-2 puffs into the lungs every 6 (six) hours as needed  for wheezing or shortness of breath.   Yes [provider]  fluticasone (VERAMYST) 27.5 MCG/SPRAY nasal spray Place 2 sprays into the nose daily.   Yes [provider]  gabapentin (NEURONTIN) 300 MG capsule Take 600 mg by mouth 2 (two) times daily. 2 caps TID   Yes [provider]  ibuprofen (ADVIL) 200 MG tablet Take 2 tablets (400 mg total) by mouth every 6 (six) hours as needed for moderate pain or mild pain. 02/17/22  Yes Sunnie Nielsen, DO  insulin detemir (LEVEMIR) 100 UNIT/ML injection Inject 38 Units into the skin 2 (two) times daily.   Yes [provider]   insulin lispro (HUMALOG) 100 UNIT/ML injection Inject 0-10 Units into the skin 3 (three) times daily as needed for high blood sugar. Sliding scale as needed for BG over 200.   Yes [provider]  lisinopril-hydrochlorothiazide (ZESTORETIC) 20-25 MG tablet Take 0.5 tablets by mouth daily.   Yes [provider]  metFORMIN (GLUCOPHAGE) 1000 MG tablet Take 1,000 mg by mouth 2 (two) times daily with a meal.   Yes [provider]  metoprolol succinate (TOPROL-XL) 50 MG 24 hr tablet Take 50 mg by mouth daily. Take with or immediately following a meal.   Yes [provider]  Multiple Vitamin (MULTIVITAMIN WITH MINERALS) TABS tablet Take 1 tablet by mouth daily. 02/18/22  Yes Sunnie Nielsen, DO  omeprazole (PRILOSEC) 20 MG capsule Take 20 mg by mouth 2 (two) times daily.   Yes [provider]  rosuvastatin (CRESTOR) 40 MG tablet Take 40 mg by mouth daily.   Yes [provider]  doxycycline (VIBRA-TABS) 100 MG tablet Take 1 tablet (100 mg total) by mouth 2 (two) times daily. Take first dose evening 02/17/22 and start twice daily dosing tomorrow 02/18/22 Patient not taking: Reported on 10/07/2022 02/17/22   Sunnie Nielsen, DO  Sodium Chloride Flush (SALINE FLUSH) 0.9 % SOLN As needed for dressing changes Patient not taking: Reported on 10/07/2022 02/17/22   Sunnie Nielsen, DO   DG Foot Complete Left  Result Date: 10/07/2022 CLINICAL DATA:  Infection. EXAM: LEFT FOOT - COMPLETE 3 VIEW COMPARISON:  None Available. FINDINGS: No fracture or dislocation. Slight hypertrophic changes of the first metatarsophalangeal joint. Preserved bone mineralization. No definite erosive changes. Vascular calcifications are seen. Soft tissue swelling about the first ray. The AP view is limited by motion. Overall if there is further concern of infection including bone infection recommend additional evaluation for a penetrating ulcer down to bone as well as possible MRI or  bone scan for further sensitivity. IMPRESSION: Soft tissue swelling of the first ray without erosive changes. Motion on the AP view. Electronically Signed   By: Karen Kays M.D.   On: 10/07/2022 16:12    Positive ROS: All other systems have been reviewed and were otherwise negative with the exception of those mentioned in the HPI and as above.  12 point ROS was performed.  Physical Exam: General: Alert and oriented.  No apparent distress.  Vascular:  Left foot:Dorsalis Pedis:  diminished Posterior Tibial:  diminished  Right foot: Dorsalis Pedis:  diminished Posterior Tibial:  diminished  Neuro:absent protective sensation  Derm: Noted cellulitis to the dorsal aspect of his left foot.  He had a ulcer to the plantar aspect of the left first MTPJ.  With compression there was a mild amount of purulent drainage.  Probing of the plantar ulceration extended into the first webspace.  On the dorsal aspect of the left first  webspace there was an area of fluctuance.  I was able to puncture this superficially and frank purulence was noted from this area.  This purulence was then cultured at this time.  Ortho/MS: He is status post transmetatarsal amputation of the right foot.  Left foot edema is noted today.  I personally reviewed the x-rays which were negative for erosive changes or periosteal bone reaction.  Assessment: Abscess left foot first webspace and great toe Diabetes with neuropathy Diabetic foot ulcer left foot  Plan: MRI was ordered to evaluate the remainder of the great toe and first metatarsal.  There is an area of necrosis to the great toe and with the amount of purulent drainage I suspect there will be further necrotic nonviable tissue to the entire base of the great toe.  He is at high risk of needing amputation of his great toe.  At minimum will need to perform an I&D but I did discuss if there is minimal tissue for closure or if the MRI shows obvious infection to the great toe he  will need to undergo an amputation.  Patient has given consent for this.  Will plan on surgery in the morning.  Deep wound culture was performed by myself.  MRI has been ordered as well as noninvasive vascular testing to the lower extremity.    Irean Hong, DPM Cell [phone]   10/08/2022 11:17 AM

## 2022-10-08 NOTE — Discharge Instructions (Signed)
Rent/Utility/Housing  Agency Name: [facility] Address: [address] [place], [place] [address] Phone: [phone] Email: [email or web address] Website: [email or web address] Service(s) Offered: Housing services, self-sufficiency, congregate meal program, weatherization program, Field seismologist program, emergency food assistance,  housing counseling, home ownership program, wheels -towork program.  Agency Name: [facility] Address: [address], [place], [place] [address] Phone: [phone] (8a-4p) [phone] (8p- 10p) Email: [email or web address] Website: [email or web address] Service(s) Offered: A program for homeless and/or needy men that includes one-on-one counseling, life skills training and job rehabilitation.  Agency Name: [facility] Address: [address], [place], [place] [address] Phone: [phone] Website: www.alliedchurches.org Service(s) Offered: Assistance to needy in emergency with utility bills, heating fuel, and prescriptions. Shelter for homeless 7pm-7am. July 28, 2016 15  Agency Name: [facility] [place] (Developmentally Disabled) Address: [address], [place], [place] [address] Phone: [phone] Contact Person: Cathleen Corti Email: [email or web address] Website: [email or web address] Service(s) Offered: Helps individuals with developmental disabilities move from housing that is more restrictive to homes where they  can achieve greater independence and have more  opportunities.  Agency Name: [facility] Address: [address], [place], [place] [address] Phone: [phone] Email: [email or web address] Website: www.[email or web address].org Service(s) Offered: Provides affordable housing for low-income families, elderly, and disabled individuals. Offer a wide range of  programs and services, from financial planning to  afterschool and summer programs.  Agency Name: [facility] Address: [address] [place], [place] [address] Phone: [phone] Service(s) Offered: Child support services; child welfare services; food stamps; Medicaid; work first family assistance; and aid with fuel,  rent, food and medicine.  Agency Name: [facility] [place] [employer] Address: [facility] [address], [place], [place]  [address] Phone: [phone] Website: www.familyabuseservices.org Service(s) Offered: 24 hour Crisis Line: [phone]; 24 hour Emergency Shelter; Transitional Housing; Support Groups; Scientist, physiological; [employer]; Hispanic Outreach: [phone];  Visitation Center: [phone].  Agency Name: [facility] [place]. Address: [address], [place], [place] [address] Phone: [phone] Service(s) Offered: CAP Services; Home and [employer]; Individual or Group Supports; Respite Care Non-Institutional Nursing;  Residential Supports; Respite Care and Personal Care Services; Transportation; Family and Friends Night; Recreational Activities; Three Nutritious Meals/Snacks; Consultation with Registered Dietician; Twenty-four hour Registered Nurse Access; Daily and [employer]; Camp [employer]aves; [place] for the [employer] (During Summer Months) Bingo Night (Every  Wednesday Night); Special Populations Dance Night  (Every Tuesday Night); Professional Hair Care Services.  Agency Name: [facility] Address: [address], [place], [place] [address] Phone: [phone] Contact Person: Jabier Mutton Website: [email or web address].Physicist, medical) Offered: Residential treatment facility for women; food and  clothing, educational & employment development and  transportation to work; Counsellor of financial skills;  parenting and family reunification; emotional and spiritual  support;  transitional housing for program graduates.  Agency Name: [facility] Address: [address], [place], [place] [address] Phone: [phone] Email: [email or web address] Website: TaskTown.es Service(s) Offered: Public housing units for elderly, disabled, and low income people; housing choice vouchers for income eligible  applicants; shelter plus care vouchers; and Psychologist, clinical.  Agency Name: [facility] [employer] Address: [address] [place], [place] [address] Phone: [phone] Email: [email or web address] Website: www.habitatalamance.org Service(s) Offered: Build houses for families in need of decent housing. Each adult in the family must invest 200 hours of labor on  someone else's house, work with volunteers to build their own house, attend classes  on budgeting, home maintenance, yard care, and attend homeowner association meetings.  Agency Name: [facility] Address: [address] [place], [place] [address] Phone: [phone] Website: www.rsli.org Service(s) Offered: Intermediate care facilities for intellectually delayed, Supervised Living in group homes for adults with developmental disabilities, Supervised Living for people who have dual diagnoses (MRMI), Independent Living, Supported Living, respite and a variety of CAP services, pre-vocational services, day supports, and [employer].  Agency Name: [place] [employer] Phone: [phone] Website: www.NCForeclosurePrevention.gov Service(s) Offered: Zero-interest, deferred loans to homeowners struggling to pay their mortgage. Call for more information.

## 2022-10-08 NOTE — Progress Notes (Signed)
Progress Note   Patient: Jared Castillo ZOX:[id number] DOB: [birth year] DOA: 10/07/2022     1 DOS: the patient was seen and examined on 10/08/2022   Brief hospital course: 72 y.o. 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  Patient started having redness, swelling, skin breakdown and increased pain of the foot prompting him to see his podiatrist.  He denies fever or chills. ED course and data review: Vitals within normal limits.  Labs with WBC 10,700 and lactic acid 2.1.  Sed rate 74.  Glucose 432 with sodium 128. X-ray showed soft tissue swelling of the first ray without erosive changes. Patient started on Zosyn and vancomycin, given Dilaudid for pain. Hospitalist consulted for admission.  7/6.  Continue IV antibiotics.  MRI of the foot ordered.  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  CRP and ESR on the higher side.  Patient on vancomycin and cefepime.   Uncontrolled type 2 diabetes mellitus with hyperglycemia, with long-term current use of insulin (HCC) Diabetic neuropathy Pseudohyponatremia Hemoglobin A1c elevated at 12.4.  Patient currently on Semglee insulin 20 units twice a day and sliding scale.  Essential hypertension Continue metoprolol and lisinopril without HCTZ (with low sodium)  Lactic acidosis Hold metformin.  Hyponatremia Potentially pseudohyponatremia with elevated sugars versus hyponatremia secondary to  hydrochlorothiazide.  Hypokalemia Replace potassium orally.        Subjective: Patient does have some pain in the first toe.  He stated he dropped a shelf on it.  Patient feels that the redness is less today than yesterday.  Physical Exam: Vitals:   10/07/22 2030 10/07/22 2100 10/07/22 2237 10/08/22 0748  BP: 124/76 106/68 135/85 126/74  Pulse: 78 73 69 (!) 59  Resp: 20 19 20 18   Temp:   97.8 F (36.6 C) 97.6 F (36.4 C)  TempSrc:      SpO2: 98% 97% 99% 97%  Weight:      Height:       Physical Exam HENT:     Head: Normocephalic.     Mouth/Throat:     Pharynx: No oropharyngeal exudate.  Eyes:     General: Lids are normal.     Conjunctiva/sclera: Conjunctivae normal.  Cardiovascular:     Rate and Rhythm: Normal rate and regular rhythm.     Heart sounds: Normal heart sounds, S1 normal and S2 normal.  Pulmonary:     Breath sounds: No decreased breath sounds, wheezing, rhonchi or rales.  Abdominal:     Palpations: Abdomen is soft.     Tenderness: There is no abdominal tenderness.  Musculoskeletal:     Right lower leg: No swelling.     Left lower leg: No swelling.  Skin:    General: Skin is warm.     Findings: No rash.  Neurological:     Mental Status: He is alert and oriented to person, place, and time.     Data Reviewed: Hemoglobin A1c elevated at 12.4, hemoglobin 11, lactic acid 2.1, CRP 97, ESR 74, creatinine 1.03, potassium 3.2, sodium 131  Family Communication: Message for patient's wife  Disposition: Status is: Inpatient Remains inpatient appropriate because: Continue IV antibiotics.  MRI of the foot ordered for further evaluation.  Planned Discharge Destination: Home    Time spent: 28 minutes Case discussed with podiatry.  Author: Alford Highland, MD 10/08/2022 1:59 PM  For on call review [email or web address].

## 2022-10-08 NOTE — Hospital Course (Signed)
72 y.o. 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  Patient started having redness, swelling, skin breakdown and increased pain of the foot prompting him to see his podiatrist.  He denies fever or chills. ED course and data review: Vitals within normal limits.  Labs with WBC 10,700 and lactic acid 2.1.  Sed rate 74.  Glucose 432 with sodium 128. X-ray showed soft tissue swelling of the first ray without erosive changes. Patient started on Zosyn and vancomycin, given Dilaudid for pain. Hospitalist consulted for admission.  7/6.  Continue IV antibiotics.  MRI showing early acute osteomyelitis of the proximal phalanx of the left great toe and 2.7 x 0.5 x 2.6 cm of developing abscess.  ABI done but still pending 7/7.  Podiatry took to the operating room for amputation of left great toe and full-thickness excisional debridement of ulcer.  ABI still pending. 7/8.  Vascular surgery consultation for the way the radiology team read the ABI.  Vascular team to take to angiogram tomorrow.  Patient complaining of foot pain. 7/9.  Angiogram today did not show any blockages.

## 2022-10-09 ENCOUNTER — Other Ambulatory Visit: Payer: Self-pay

## 2022-10-09 ENCOUNTER — Inpatient Hospital Stay: Payer: Medicare Other | Admitting: Anesthesiology

## 2022-10-09 ENCOUNTER — Encounter
Admission: EM | Disposition: A | Discharge status: Home / Self Care | Payer: Self-pay | Source: Home / Self Care | Attending: Internal Medicine

## 2022-10-09 DIAGNOSIS — L03032 Cellulitis of left toe: Secondary | ICD-10-CM | POA: Diagnosis not present

## 2022-10-09 DIAGNOSIS — M869 Osteomyelitis, unspecified: Secondary | ICD-10-CM

## 2022-10-09 DIAGNOSIS — M8618 Other acute osteomyelitis, other site: Secondary | ICD-10-CM

## 2022-10-09 DIAGNOSIS — E11621 Type 2 diabetes mellitus with foot ulcer: Secondary | ICD-10-CM

## 2022-10-09 DIAGNOSIS — E1165 Type 2 diabetes mellitus with hyperglycemia: Secondary | ICD-10-CM | POA: Diagnosis not present

## 2022-10-09 DIAGNOSIS — I10 Essential (primary) hypertension: Secondary | ICD-10-CM | POA: Diagnosis not present

## 2022-10-09 HISTORY — PX: AMPUTATION TOE: SHX6595

## 2022-10-09 LAB — GLUCOSE, CAPILLARY
Glucose-Capillary: 169 mg/dL — ABNORMAL HIGH (ref 70–99)
Glucose-Capillary: 141 mg/dL — ABNORMAL HIGH (ref 70–99)
Glucose-Capillary: 250 mg/dL — ABNORMAL HIGH (ref 70–99)
Glucose-Capillary: 275 mg/dL — ABNORMAL HIGH (ref 70–99)
Glucose-Capillary: 313 mg/dL — ABNORMAL HIGH (ref 70–99)

## 2022-10-09 LAB — CULTURE, BLOOD (ROUTINE X 2)

## 2022-10-09 SURGERY — AMPUTATION, TOE
Anesthesia: General | Site: Toe | Laterality: Left

## 2022-10-09 MED ORDER — INSULIN DETEMIR 100 UNIT/ML ~~LOC~~ SOLN
20.0000 [IU] | Freq: Two times a day (BID) | SUBCUTANEOUS | Status: DC
  Administered 2022-10-09 – 2022-10-12 (×6): 20 [IU] via SUBCUTANEOUS
  Filled 2022-10-09 (×7): qty 0.2

## 2022-10-09 MED ORDER — HYALURONIDASE HUMAN 150 UNIT/ML IJ SOLN
150.0000 [IU] | Freq: Once | INTRAMUSCULAR | Status: AC
  Administered 2022-10-09: 150 [IU] via SUBCUTANEOUS
  Filled 2022-10-09: qty 1

## 2022-10-09 MED ORDER — PROPOFOL 10 MG/ML IV BOLUS
INTRAVENOUS | Status: DC | PRN
  Administered 2022-10-09: 130 mg via INTRAVENOUS

## 2022-10-09 MED ORDER — FENTANYL CITRATE (PF) 100 MCG/2ML IJ SOLN
INTRAMUSCULAR | Status: DC | PRN
  Administered 2022-10-09 (×2): 25 ug via INTRAVENOUS

## 2022-10-09 MED ORDER — HYDROMORPHONE HCL 1 MG/ML IJ SOLN
0.5000 mg | INTRAMUSCULAR | Status: DC | PRN
  Administered 2022-10-09 – 2022-10-11 (×7): 0.5 mg via INTRAVENOUS
  Filled 2022-10-09 (×7): qty 0.5

## 2022-10-09 MED ORDER — LACTATED RINGERS IV SOLN: INTRAVENOUS | Status: DC

## 2022-10-09 MED ORDER — PROPOFOL 10 MG/ML IV BOLUS
INTRAVENOUS | Status: AC
  Filled 2022-10-09: qty 20

## 2022-10-09 MED ORDER — FENTANYL CITRATE (PF) 100 MCG/2ML IJ SOLN: 25.0000 ug | INTRAMUSCULAR | Status: DC | PRN

## 2022-10-09 MED ORDER — ACETAMINOPHEN 10 MG/ML IV SOLN: 1000.0000 mg | Freq: Once | INTRAVENOUS | Status: DC | PRN

## 2022-10-09 MED ORDER — INSULIN DETEMIR 100 UNIT/ML ~~LOC~~ SOLN
8.0000 [IU] | Freq: Two times a day (BID) | SUBCUTANEOUS | Status: DC
  Administered 2022-10-09: 8 [IU] via SUBCUTANEOUS
  Filled 2022-10-09 (×2): qty 0.08

## 2022-10-09 MED ORDER — LIDOCAINE HCL (PF) 2 % IJ SOLN
INTRAMUSCULAR | Status: AC
  Filled 2022-10-09: qty 5

## 2022-10-09 MED ORDER — ONDANSETRON HCL 4 MG/2ML IJ SOLN: 4.0000 mg | Freq: Once | INTRAMUSCULAR | Status: DC | PRN

## 2022-10-09 MED ORDER — ONDANSETRON HCL 4 MG/2ML IJ SOLN
INTRAMUSCULAR | Status: AC
  Filled 2022-10-09: qty 2

## 2022-10-09 MED ORDER — OXYCODONE HCL 5 MG/5ML PO SOLN: 5.0000 mg | Freq: Once | ORAL | Status: DC | PRN

## 2022-10-09 MED ORDER — SEVOFLURANE IN SOLN
RESPIRATORY_TRACT | Status: AC
  Filled 2022-10-09: qty 250

## 2022-10-09 MED ORDER — ONDANSETRON HCL 4 MG/2ML IJ SOLN
INTRAMUSCULAR | Status: DC | PRN
  Administered 2022-10-09: 4 mg via INTRAVENOUS

## 2022-10-09 MED ORDER — LIDOCAINE HCL (CARDIAC) PF 100 MG/5ML IV SOSY
PREFILLED_SYRINGE | INTRAVENOUS | Status: DC | PRN
  Administered 2022-10-09: 80 mg via INTRAVENOUS

## 2022-10-09 MED ORDER — BUPIVACAINE HCL 0.5 % IJ SOLN
INTRAMUSCULAR | Status: DC | PRN
  Administered 2022-10-09: 10 mL

## 2022-10-09 MED ORDER — SODIUM CHLORIDE 0.9 % IV SOLN: INTRAVENOUS | Status: DC | PRN

## 2022-10-09 MED ORDER — OXYCODONE HCL 5 MG PO TABS: 5.0000 mg | ORAL_TABLET | Freq: Once | ORAL | Status: DC | PRN

## 2022-10-09 MED ORDER — FENTANYL CITRATE (PF) 100 MCG/2ML IJ SOLN
INTRAMUSCULAR | Status: AC
  Filled 2022-10-09: qty 2

## 2022-10-09 SURGICAL SUPPLY — 51 items
BLADE OSC/SAGITTAL MD 5.5X18 (BLADE) ×1 IMPLANT
BLADE SURG MINI STRL (BLADE) ×1 IMPLANT
BNDG CMPR 75X21 PLY HI ABS (MISCELLANEOUS) ×1
BNDG CMPR STD VLCR NS LF 5.8X4 (GAUZE/BANDAGES/DRESSINGS) ×1
BNDG ELASTIC 4X5.8 VLCR NS LF (GAUZE/BANDAGES/DRESSINGS) ×1 IMPLANT
BNDG ESMARCH 4 X 12 STRL LF (GAUZE/BANDAGES/DRESSINGS) ×1
BNDG ESMARCH 4X12 STRL LF (GAUZE/BANDAGES/DRESSINGS) ×1 IMPLANT
BNDG GAUZE DERMACEA FLUFF 4 (GAUZE/BANDAGES/DRESSINGS) ×1 IMPLANT
BNDG GZE 12X3 1 PLY HI ABS (GAUZE/BANDAGES/DRESSINGS) ×2
BNDG GZE DERMACEA 4 6PLY (GAUZE/BANDAGES/DRESSINGS) ×1
BNDG STRETCH GAUZE 3IN X12FT (GAUZE/BANDAGES/DRESSINGS) ×2 IMPLANT
CUFF TOURN SGL QUICK 12 (TOURNIQUET CUFF) IMPLANT
CUFF TOURN SGL QUICK 18X4 (TOURNIQUET CUFF) IMPLANT
DRAPE FLUOR MINI C-ARM 54X84 (DRAPES) ×1 IMPLANT
DRAPE XRAY CASSETTE 23X24 (DRAPES) ×1 IMPLANT
DURAPREP 26ML APPLICATOR (WOUND CARE) ×1 IMPLANT
ELECT REM PT RETURN 9FT ADLT (ELECTROSURGICAL) ×1
ELECTRODE REM PT RTRN 9FT ADLT (ELECTROSURGICAL) ×1 IMPLANT
GAUZE PACKING IODOFORM 1/2INX (GAUZE/BANDAGES/DRESSINGS) ×1 IMPLANT
GAUZE SPONGE 4X4 12PLY STRL (GAUZE/BANDAGES/DRESSINGS) ×1 IMPLANT
GAUZE STRETCH 2X75IN STRL (MISCELLANEOUS) ×1 IMPLANT
GAUZE XEROFORM 1X8 LF (GAUZE/BANDAGES/DRESSINGS) ×1 IMPLANT
GLOVE BIO SURGEON STRL SZ7.5 (GLOVE) ×1 IMPLANT
GLOVE INDICATOR 8.0 STRL GRN (GLOVE) ×1 IMPLANT
GOWN SPEC L4 XLG W/TWL (GOWN DISPOSABLE) ×1 IMPLANT
GOWN STRL REUS W/ TWL XL LVL3 (GOWN DISPOSABLE) ×1 IMPLANT
GOWN STRL REUS W/TWL XL LVL3 (GOWN DISPOSABLE) ×1
HANDPIECE VERSAJET DEBRIDEMENT (MISCELLANEOUS) IMPLANT
IV NS IRRIG 3000ML ARTHROMATIC (IV SOLUTION) ×1 IMPLANT
KIT TURNOVER KIT A (KITS) ×1 IMPLANT
LABEL OR SOLS (LABEL) ×1 IMPLANT
MANIFOLD NEPTUNE II (INSTRUMENTS) ×1 IMPLANT
NDL FILTER BLUNT 18X1 1/2 (NEEDLE) ×1 IMPLANT
NDL HYPO 25X1 1.5 SAFETY (NEEDLE) ×1 IMPLANT
NEEDLE FILTER BLUNT 18X1 1/2 (NEEDLE) ×1 IMPLANT
NEEDLE HYPO 25X1 1.5 SAFETY (NEEDLE) ×1 IMPLANT
NS IRRIG 500ML POUR BTL (IV SOLUTION) ×1 IMPLANT
PACK EXTREMITY ARMC (MISCELLANEOUS) ×1 IMPLANT
PAD ABD DERMACEA PRESS 5X9 (GAUZE/BANDAGES/DRESSINGS) ×2 IMPLANT
PULSAVAC PLUS IRRIG FAN TIP (DISPOSABLE)
SHIELD FULL FACE ANTIFOG 7M (MISCELLANEOUS) ×1 IMPLANT
STOCKINETTE M/LG 89821 (MISCELLANEOUS) ×1 IMPLANT
STRAP SAFETY 5IN WIDE (MISCELLANEOUS) ×1 IMPLANT
SUT ETHILON 3-0 FS-10 30 BLK (SUTURE) ×1
SUT ETHILON 5-0 FS-2 18 BLK (SUTURE) ×1 IMPLANT
SUT VIC AB 4-0 FS2 27 (SUTURE) ×1 IMPLANT
SUTURE EHLN 3-0 FS-10 30 BLK (SUTURE) ×1 IMPLANT
SYR 10ML LL (SYRINGE) ×3 IMPLANT
TIP FAN IRRIG PULSAVAC PLUS (DISPOSABLE) ×1 IMPLANT
TRAP FLUID SMOKE EVACUATOR (MISCELLANEOUS) ×1 IMPLANT
WATER STERILE IRR 500ML POUR (IV SOLUTION) ×1 IMPLANT

## 2022-10-09 NOTE — Anesthesia Preprocedure Evaluation (Addendum)
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)   Pulmonary exam normal breath sounds clear to auscultation       Cardiovascular hypertension, Normal cardiovascular exam Rhythm:Regular Rate:Normal     Neuro/Psych  Neuromuscular disease (diabetic polyneuropathy)    GI/Hepatic ,GERD  ,,  Endo/Other  diabetes (A1c 12.4), Poorly Controlled, Type 2    Renal/GU Renal disease (CKD, nephrolithiasis)     Musculoskeletal   Abdominal   Peds  Hematology negative hematology ROS (+)   Anesthesia Other Findings   Reproductive/Obstetrics                             Anesthesia Physical Anesthesia Plan  ASA: 3  Anesthesia Plan: General   Post-op Pain Management:    Induction: Intravenous  PONV Risk Score and Plan: 2 and Ondansetron, Dexamethasone and Treatment may vary due to age or medical condition  Airway Management Planned: LMA  Additional Equipment:   Intra-op Plan:   Post-operative Plan: Extubation in OR  Informed Consent: I have reviewed the patients History and Physical, chart, labs and discussed the procedure including the risks, benefits and alternatives for the proposed anesthesia with the patient or authorized representative who has indicated his/her understanding and acceptance.     Dental advisory given  Plan Discussed with: CRNA  Anesthesia Plan Comments: (Patient consented for risks of anesthesia including but not limited to:  - adverse reactions to medications - damage to eyes, teeth, lips or other oral  mucosa - nerve damage due to positioning  - sore throat or hoarseness - damage to heart, brain, nerves, lungs, other parts of body or loss of life  Informed patient about role of CRNA in peri- and intra-operative care.  Patient voiced understanding.)       Anesthesia Quick Evaluation

## 2022-10-09 NOTE — Anesthesia Postprocedure Evaluation (Signed)
Anesthesia Post Note  Patient: Jared Castillo  Procedure(s) Performed: AMPUTATION TOE (Left: Toe)  Patient location during evaluation: PACU Anesthesia Type: General Level of consciousness: awake and alert, oriented and patient cooperative Pain management: pain level controlled Vital Signs Assessment: post-procedure vital signs reviewed and stable Respiratory status: spontaneous breathing, nonlabored ventilation and respiratory function stable Cardiovascular status: blood pressure returned to baseline and stable Postop Assessment: adequate PO intake Anesthetic complications: no   No notable events documented.   Last Vitals:  Vitals:   10/09/22 0915 10/09/22 0943  BP: 120/67 (!) 123/49  Pulse: (!) 57 (!) 59  Resp: 14 14  Temp: (!) 36.1 C 36.4 C  SpO2: 96% 99%    Last Pain:  Vitals:   10/09/22 0915  TempSrc:   PainSc: 0-No pain                 Reed Breech

## 2022-10-09 NOTE — Assessment & Plan Note (Addendum)
Went to OR today for debridement of abscess.  Continue IV antibiotics.

## 2022-10-09 NOTE — Assessment & Plan Note (Addendum)
Acute osteomyelitis first left great toe.  Amputation of the first great toe today by podiatry.  Continue IV antibiotics

## 2022-10-09 NOTE — Op Note (Signed)
Operative note   Surgeon:Zandon Talton [employer] logistics/support/administrative officer: None    Preop diagnosis: 1.  Osteomyelitis left great toe 2.  Full-thickness ulcer plantar left first MTPJ    Postop diagnosis: Same    Procedure: 1.  Amputation left great toe MTPJ 2.  Full-thickness excisional debridement to muscle plantar left first MTPJ    EBL: Minimal    Anesthesia:local and general.  Local consisted of a total of 10 cc of 0.5% bupivacaine infiltrated around the metatarsophalangeal joint and metatarsal    Hemostasis: Midcalf tourniquet inflated to 200 mmHg for 9 minutes    Specimen: Deep wound culture and great toe for pathology    Complications: None    Operative indications:Dalonte Teta is an 72 y.o. that presents today for surgical intervention.  The risks/benefits/alternatives/complications have been discussed and consent has been given.    Procedure:  Patient was brought into the OR and placed on the operating table in thesupine position. After anesthesia was obtained theleft lower extremity was prepped and draped in usual sterile fashion.  Attention was directed to the left foot where the noted abscess and ulceration was mapped out.  The ulcer was plantar to the first metatarsal at the level of the metatarsophalangeal joint.  There was noted to be an abscess along the lateral aspect of the base of the proximal phalanx with a area of necrotic tissue at about the level of the interphalangeal joint.  Full-thickness flap was created with a medial based flap at this time.  The toe was then disarticulated from the metatarsophalangeal joint and removed from the surgical field in toto.  All of the necrotic nonviable tissue was excised.  The wound was then flushed with copious amounts of irrigation.  Attention was directed to the plantar aspect of the metatarsophalangeal joint at the full-thickness ulcerative site.  The ulceration predebridement measured approximately 5 mm in diameter and postdebridement was 1 cm in  diameter.  Full-thickness excisional debridement was performed down to the level and including muscle with a Versajet and 15 blade.  The Versajet was used to then debride within the amputation site as well.  After final flushing the tourniquet was dropped.  Mild bleeding was noted.  The skin was then closed with a 3-0 nylon.  The plantar ulcerative site was then packed with half-inch iodoform packing strip.  A bulky sterile dressing was applied.    Patient tolerated the procedure and anesthesia well.  Was transported from the OR to the PACU with all vital signs stable and vascular status intact.

## 2022-10-09 NOTE — Anesthesia Procedure Notes (Signed)
Procedure Name: LMA Insertion Date/Time: 10/09/2022 8:19 AM  Performed by: Omer Jack, CRNAPre-anesthesia Checklist: Patient identified, Patient being monitored, Timeout performed, Emergency Drugs available and Suction available Patient Re-evaluated:Patient Re-evaluated prior to induction Oxygen Delivery Method: Circle system utilized Preoxygenation: Pre-oxygenation with 100% oxygen Induction Type: IV induction Ventilation: Mask ventilation without difficulty LMA: LMA inserted LMA Size: 4.0 Tube type: Oral Number of attempts: 1 Placement Confirmation: positive ETCO2 and breath sounds checked- equal and bilateral Tube secured with: Tape Dental Injury: Teeth and Oropharynx as per pre-operative assessment

## 2022-10-09 NOTE — Plan of Care (Signed)

## 2022-10-09 NOTE — Assessment & Plan Note (Signed)
Had debridement of diabetic foot ulcer in the operating room today.

## 2022-10-09 NOTE — Progress Notes (Signed)
Progress Note   Patient: Xyion Virden JYN:[id number] DOB: 1950-12-03 DOA: 10/07/2022     2 DOS: the patient was seen and examined on 10/09/2022   Brief hospital course: 72 y.o. 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  Patient started having redness, swelling, skin breakdown and increased pain of the foot prompting him to see his podiatrist.  He denies fever or chills. ED course and data review: Vitals within normal limits.  Labs with WBC 10,700 and lactic acid 2.1.  Sed rate 74.  Glucose 432 with sodium 128. X-ray showed soft tissue swelling of the first ray without erosive changes. Patient started on Zosyn and vancomycin, given Dilaudid for pain. Hospitalist consulted for admission.  7/6.  Continue IV antibiotics.  MRI showing early acute osteomyelitis of the proximal phalanx of the left great toe and 2.7 x 0.5 x 2.6 cm of developing abscess.  ABI done but still pending 7/7.  Podiatry took to the operating room for amputation of left great toe and full-thickness excisional debridement of ulcer.  ABI still pending.  Assessment and Plan: Osteomyelitis (HCC) Acute osteomyelitis first left great toe.  Amputation of the first great toe today by podiatry.  Continue IV antibiotics  Cellulitis and abscess of toe Went to OR today for debridement of abscess.  Continue IV antibiotics.  Diabetic foot ulcer (HCC) Had debridement of diabetic foot ulcer in the operating room today.  Uncontrolled type 2 diabetes mellitus with hyperglycemia, with long-term current use of insulin (HCC) Diabetic neuropathy Pseudohyponatremia Hemoglobin A1c elevated at 12.4.  Patient currently on  Semglee insulin 20 units twice a day and sliding scale.  Essential hypertension Continue metoprolol and lisinopril without HCTZ (with low sodium)  Lactic acidosis Hold metformin.  Hyponatremia Potentially pseudohyponatremia with elevated sugars versus hyponatremia secondary to hydrochlorothiazide.  Hypokalemia Replaced        Subjective: Patient seen prior to operation this morning.  Feeling okay.  Had some pain in his toe.  Physical Exam: Vitals:   10/09/22 0857 10/09/22 0901 10/09/22 0915 10/09/22 0943  BP: 106/70 106/70 120/67 (!) 123/49  Pulse: (!) 56  (!) 57 (!) 59  Resp: 14 16 14 14   Temp: (!) 97.5 F (36.4 C)  (!) 96.9 F (36.1 C) 97.6 F (36.4 C)  TempSrc:      SpO2: 100% 98% 96% 99%  Weight:      Height:       Physical Exam HENT:     Head: Normocephalic.     Mouth/Throat:     Pharynx: No oropharyngeal exudate.  Eyes:     General: Lids are normal.     Conjunctiva/sclera: Conjunctivae normal.  Cardiovascular:     Rate and Rhythm: Normal rate and regular rhythm.     Heart sounds: Normal heart sounds, S1 normal and S2 normal.  Pulmonary:     Breath sounds: No decreased breath sounds, wheezing, rhonchi or rales.  Abdominal:     Palpations: Abdomen is soft.     Tenderness: There is no abdominal tenderness.  Musculoskeletal:     Right lower leg: No swelling.     Left lower leg: No swelling.     Comments: Left foot wrapped  Skin:  General: Skin is warm.  Neurological:     Mental Status: He is alert and oriented to person, place, and time.     Data Reviewed: MRI left foot showing osteomyelitis first great toe and developing abscess.  Family Communication: Spoke with wife at the bedside  Disposition: Status is: Inpatient Remains inpatient appropriate because: Amputation of the first great toe today and debridement of ulcer.  Planned Discharge Destination: Home    Time spent: 28 minutes  Author: Alford Highland, MD 10/09/2022 1:39 PM  For  on call review [email or web address].

## 2022-10-09 NOTE — Transfer of Care (Signed)
Immediate Anesthesia Transfer of Care Note  Patient: Jared Castillo  Procedure(s) Performed: AMPUTATION TOE (Left: Toe)  Patient Location: PACU  Anesthesia Type:General  Level of Consciousness: drowsy and patient cooperative  Airway & Oxygen Therapy: Patient Spontanous Breathing and Patient connected to nasal cannula oxygen  Post-op Assessment: Report given to RN and Post -op Vital signs reviewed and stable  Post vital signs: Reviewed and stable  Last Vitals:  Vitals Value Taken Time  BP 106/70 10/09/22 0901  Temp    Pulse 56 10/09/22 0901  Resp 14 10/09/22 0901  SpO2 100 % 10/09/22 0901  Vitals shown include unvalidated device data.  Last Pain:  Vitals:   10/08/22 2252  TempSrc:   PainSc: 4       Patients Stated Pain Goal: 0 (10/08/22 2207)  Complications: No notable events documented.

## 2022-10-10 ENCOUNTER — Encounter: Payer: Self-pay | Admitting: Podiatry

## 2022-10-10 DIAGNOSIS — L03116 Cellulitis of left lower limb: Secondary | ICD-10-CM

## 2022-10-10 DIAGNOSIS — E1151 Type 2 diabetes mellitus with diabetic peripheral angiopathy without gangrene: Secondary | ICD-10-CM

## 2022-10-10 DIAGNOSIS — Z89412 Acquired absence of left great toe: Secondary | ICD-10-CM

## 2022-10-10 DIAGNOSIS — E08621 Diabetes mellitus due to underlying condition with foot ulcer: Secondary | ICD-10-CM

## 2022-10-10 DIAGNOSIS — L03032 Cellulitis of left toe: Secondary | ICD-10-CM | POA: Diagnosis not present

## 2022-10-10 DIAGNOSIS — E1140 Type 2 diabetes mellitus with diabetic neuropathy, unspecified: Secondary | ICD-10-CM

## 2022-10-10 DIAGNOSIS — Z794 Long term (current) use of insulin: Secondary | ICD-10-CM

## 2022-10-10 DIAGNOSIS — L02612 Cutaneous abscess of left foot: Secondary | ICD-10-CM

## 2022-10-10 DIAGNOSIS — I739 Peripheral vascular disease, unspecified: Secondary | ICD-10-CM | POA: Diagnosis not present

## 2022-10-10 DIAGNOSIS — M86172 Other acute osteomyelitis, left ankle and foot: Secondary | ICD-10-CM | POA: Diagnosis not present

## 2022-10-10 DIAGNOSIS — Z9889 Other specified postprocedural states: Secondary | ICD-10-CM

## 2022-10-10 DIAGNOSIS — E876 Hypokalemia: Secondary | ICD-10-CM

## 2022-10-10 DIAGNOSIS — L97402 Non-pressure chronic ulcer of unspecified heel and midfoot with fat layer exposed: Secondary | ICD-10-CM

## 2022-10-10 DIAGNOSIS — Z89421 Acquired absence of other right toe(s): Secondary | ICD-10-CM

## 2022-10-10 DIAGNOSIS — E871 Hypo-osmolality and hyponatremia: Secondary | ICD-10-CM

## 2022-10-10 DIAGNOSIS — E872 Acidosis, unspecified: Secondary | ICD-10-CM

## 2022-10-10 DIAGNOSIS — I10 Essential (primary) hypertension: Secondary | ICD-10-CM

## 2022-10-10 LAB — BASIC METABOLIC PANEL
Anion gap: 8 (ref 5–15)
BUN: 20 mg/dL (ref 8–23)
CO2: 26 mmol/L (ref 22–32)
Calcium: 8.9 mg/dL (ref 8.9–10.3)
Chloride: 100 mmol/L (ref 98–111)
Creatinine, Ser: 0.99 mg/dL (ref 0.61–1.24)
GFR, Estimated: >60 mL/min (ref >60)
Glucose, Bld: 181 mg/dL — ABNORMAL HIGH (ref 70–99)
Potassium: 4 mmol/L (ref 3.5–5.1)
Sodium: 134 mmol/L — ABNORMAL LOW (ref 135–145)

## 2022-10-10 LAB — GLUCOSE, CAPILLARY
Glucose-Capillary: 395 mg/dL — ABNORMAL HIGH (ref 70–99)
Glucose-Capillary: 186 mg/dL — ABNORMAL HIGH (ref 70–99)
Glucose-Capillary: 212 mg/dL — ABNORMAL HIGH (ref 70–99)
Glucose-Capillary: 264 mg/dL — ABNORMAL HIGH (ref 70–99)
Glucose-Capillary: 286 mg/dL — ABNORMAL HIGH (ref 70–99)

## 2022-10-10 LAB — CULTURE, BLOOD (ROUTINE X 2)

## 2022-10-10 LAB — HEMOGLOBIN: Hemoglobin: 11.8 g/dL — ABNORMAL LOW (ref 13.0–17.0)

## 2022-10-10 LAB — MAGNESIUM: Magnesium: 1.7 mg/dL (ref 1.7–2.4)

## 2022-10-10 MED ORDER — SODIUM CHLORIDE 0.9 % IV SOLN
2.0000 g | INTRAVENOUS | Status: DC
  Administered 2022-10-10 – 2022-10-11 (×2): 2 g via INTRAVENOUS
  Filled 2022-10-10 (×2): qty 20

## 2022-10-10 MED ORDER — INSULIN ASPART 100 UNIT/ML IJ SOLN
4.0000 [IU] | Freq: Three times a day (TID) | INTRAMUSCULAR | Status: DC
  Administered 2022-10-10 – 2022-10-12 (×8): 4 [IU] via SUBCUTANEOUS
  Filled 2022-10-10 (×6): qty 1

## 2022-10-10 MED ORDER — VALACYCLOVIR HCL 500 MG PO TABS
1000.0000 mg | ORAL_TABLET | Freq: Two times a day (BID) | ORAL | Status: AC
  Administered 2022-10-10 (×2): 1000 mg via ORAL
  Filled 2022-10-10 (×2): qty 2

## 2022-10-10 NOTE — Inpatient Diabetes Management (Addendum)
Inpatient Diabetes Program Recommendations  AACE/ADA: New Consensus Statement on Inpatient Glycemic Control (2015)  Target Ranges:  Prepandial:   less than 140 mg/dL      Peak postprandial:   less than 180 mg/dL (1-2 hours)      Critically ill patients:  140 - 180 mg/dL    Latest Reference Range & Units 10/07/22 15:47  Glucose 70 - 99 mg/dL 161 (H)  (H): Data is abnormally high  Latest Reference Range & Units 02/14/22 17:47 10/08/22 00:06  Hemoglobin A1C 4.8 - 5.6 % 13.1 (H) 12.4 (H)  309 mg/dl  (H): Data is abnormally high  Latest Reference Range & Units 10/09/22 07:21 10/09/22 09:03 10/09/22 11:33 10/09/22 16:44 10/09/22 21:11  Glucose-Capillary 70 - 99 mg/dL 096 (H)  3 units Novolog  141 (H) 250 (H)  5 units Novolog  8 units Levemir @1050   275 (H)  8 units Novolog  313 (H)  4 units Novolog  20 units Levemir  (H): Data is abnormally high  Latest Reference Range & Units 10/10/22 08:52 10/10/22 11:38  Glucose-Capillary 70 - 99 mg/dL 045 (H)  7 units Novolog  20 units Levemir 212 (H)  (H): Data is abnormally high   Admit with: Acute osteomyelitis first left great toe   History: DM  Home DM Meds: Levemir 38 units BID        Humalog 0-10 units TID per SSI        Metformin 1000 mg BID  Current Orders: Levemir 20 units BID      Novolog Moderate Correction Scale/ SSI (0-15 units) TID AC + HS   Amputation of Toe and I&D completed 7/7  Note pt got lower dose Levemir yest AM due to being NPO--Will get 20 units Levemir this AM   MD- Note late afternoon CBGs elevated.  Back on PO diet.  Please consider Starting Novolog Meal Coverage:   Novolog 4 units TID with meals HOLD if pt NPO HOLD if pt eats <50% meals    Addendum 11:35am--Met w/ pt at bedside.  Pt told me he thinks his last A1c was around 7% but he is not 100% sure.  Has Freestyle Libre CGM and uses scanner to check for glucose readings.  Has all his insulin at home and states he does not miss doses.   Verified home insulin regimen (See above).  Sees Dr. Claudette Head with [facility] for PCP needs and pt states he has been referred to [facility] as well--does not know when appt is but states his wife knows.    Spoke with patient about his current A1c of 12.4%.  Explained what an A1c is and what it measures.  Reminded patient that his goal A1c is 7% or less per ADA standards to prevent both acute and long-term complications.  Explained to patient the extreme importance of good glucose control at home and reviewed goal CBGs for home.  Encouraged patient to check his CBGs at least TID AC at home and to record all CBGs in a logbook for his PCP or Endocrinologist to review.  Also discussed DM diet information with patient.  Encouraged patient to avoid beverages with sugar (regular soda, sweet tea, lemonade, fruit juice) and to consume mostly water.  Discussed what foods contain carbohydrates and how carbohydrates affect the body's blood sugar levels.  Encouraged patient to be careful with his portion sizes (especially grains, starchy vegetables, and fruits).  Pt told me he only drinks water and diet drinks.  Asked about  fruit and we talked about how fresh fruit is the best option and to limit fruit to 1 serving 3 times daily and to avoid canned fruit and fruit juice.   --Will follow patient during hospitalization--  Ambrose Finland RN, MSN, CDCES Diabetes Coordinator Inpatient Glycemic Control Team Team Pager: [phone] (8a-5p)

## 2022-10-10 NOTE — Plan of Care (Signed)
  Problem: Skin Integrity: Goal: Skin integrity will improve Outcome: Progressing   Problem: Activity: Goal: Risk for activity intolerance will decrease Outcome: Progressing   Problem: Nutrition: Goal: Adequate nutrition will be maintained Outcome: Progressing   Problem: Safety: Goal: Ability to remain free from injury will improve Outcome: Progressing   Problem: Pain Managment: Goal: General experience of comfort will improve Outcome: Progressing

## 2022-10-10 NOTE — Progress Notes (Signed)
Pharmacy Antibiotic Note  Jared Castillo is a 72 y.o. male admitted on 10/07/2022 with cellulitis.  Pharmacy has been consulted for Vanc, Cefepime dosing.  Plan: Continue cefepime 2 grams IV every 8 hours Continue vancomycin 1 gram IV every 12 hours Estimated AUC 482, Cmin 13.9 Obtain Vancomycin trough 7/8 @ 2100, target trough 10-15 mcg/ml  Height: 6\' 1"  (185.4 cm) Weight: 83.9 kg (185 lb) IBW/kg (Calculated) : 79.9  Temp (24hrs), Avg:97.9 F (36.6 C), Min:97.6 F (36.4 C), Max:98.3 F (36.8 C)  Recent Labs  Lab 10/07/22 1547 10/07/22 1942 10/08/22 0006 10/08/22 0440 10/08/22 0443 10/10/22 0621  WBC 10.7*  --   --   --  8.4  --   CREATININE 1.20  --   --  1.03  --  0.99  LATICACIDVEN  --  2.1* 1.5  --   --   --      Estimated Creatinine Clearance: 77.3 mL/min (by C-G formula based on SCr of 0.99 mg/dL).    Allergies  Allergen Reactions   Trazodone Other (See Comments)    Nightmares    Antimicrobials this admission:  Vancomycin 7/5 >>    Cefepime 7/6 >>   Dose adjustments this admission: 7/6 Vancomycin 1750 mg IV q24h>>1g IV q12h  Microbiology results: 7/5 BCx: ngtd 7/6 Abscess: pending 7/7 Surgical culture: pending 7/6 MRSA PCR: positive  Thank you for allowing pharmacy to be a part of this patient's care.  Barrie Folk, PharmD 10/10/2022 11:14 AM

## 2022-10-10 NOTE — Assessment & Plan Note (Addendum)
Case discussed with vascular surgery and they we will set up for an angiogram tomorrow.

## 2022-10-10 NOTE — Progress Notes (Addendum)
1 Day Post-Op   Subjective/Chief Complaint: Patient seen.  Has had some significant pain with his foot.   Objective: Vital signs in last 24 hours: Temp:  [97.6 F (36.4 C)-98.3 F (36.8 C)] 97.8 F (36.6 C) (07/08 0856) Pulse Rate:  [57-68] 62 (07/08 1212) Resp:  [15-20] 15 (07/08 0856) BP: (92-120)/(59-72) 111/59 (07/08 1212) SpO2:  [95 %-99 %] 98 % (07/08 1212) Last BM Date : 10/07/22  Intake/Output from previous day: 07/07 0701 - 07/08 0700 In: 300 [I.V.:300] Out: 1228 [Urine:1225; Blood:3] Intake/Output this shift: Total I/O In: 240 [P.O.:240] Out: -   Bandage on the left foot is dry and intact.  Upon removal there is some moderate bleeding noted on the bandage from the surgical site.  Upon removal the amputation incision appears to be well coapted with some mild cyanotic appearance along the proximal lateral flap.  Mild residual cellulitis noted.  Plantar ulceration with packing appears in good shape.  No significant purulence noted.     Lab Results:  Recent Labs    10/07/22 1547 10/08/22 0443 10/10/22 0621  WBC 10.7* 8.4  --   HGB 13.4 11.0* 11.8*  HCT 38.4* 31.1*  --   PLT 345 259  --    BMET Recent Labs    10/08/22 0440 10/10/22 0621  NA 131* 134*  K 3.2* 4.0  CL 94* 100  CO2 28 26  GLUCOSE 252* 181*  BUN 17 20  CREATININE 1.03 0.99  CALCIUM 8.8* 8.9   PT/INR No results for input(s): "LABPROT", "INR" in the last 72 hours. ABG No results for input(s): "PHART", "HCO3" in the last 72 hours.  Invalid input(s): "PCO2", "PO2"  Studies/Results: US ARTERIAL ABI (SCREENING LOWER EXTREMITY)  Result Date: 10/09/2022 CLINICAL DATA:  72 year old male with history of diabetic foot ulceration. EXAM: NONINVASIVE PHYSIOLOGIC VASCULAR STUDY OF BILATERAL LOWER EXTREMITIES TECHNIQUE: Evaluation of both lower extremities were performed at rest, including calculation of ankle-brachial indices with single level Doppler, pressure and pulse volume recording.  COMPARISON:  02/15/2022 FINDINGS: Right ABI:  1.22, previously 1.34 Left ABI: 1.06 (noncompressible posterior tibial), previously noncompressible Right Lower Extremity:  Mixed arterial waveforms at the ankle. Left Lower Extremity:  Mixed arterial waveforms at the ankle. IMPRESSION: Technically normal ankle-brachial indices, however given history of diabetes, noncompressible left posterior tibial, previously noncompressible left ankle-brachial indices,, and mixed waveforms, today's normal findings are likely falsely elevated. Consider lower extremity arterial duplex versus CTA runoff for further characterization. Marliss Coots, MD [facility] Electronically Signed   By: Marliss Coots M.D.   On: 10/09/2022 15:09   MR FOOT LEFT WO CONTRAST  Result Date: 10/08/2022 CLINICAL DATA:  Foot swelling, diabetic, osteomyelitis suspected, xray done EXAM: MRI OF THE LEFT FOOT WITHOUT CONTRAST TECHNIQUE: Multiplanar, multisequence MR imaging of the left forefoot was performed. No intravenous contrast was administered. COMPARISON:  X-ray 10/07/2022 FINDINGS: Bones/Joint/Cartilage Mild bone marrow edema throughout the proximal phalanx of the left great toe. Possible subtle erosion along the medial base of the great toe proximal phalanx. Minimal subchondral marrow edema along the medial margin of the first metatarsal head. Mild marrow edema within the hallux sesamoids, more pronounced within the fibular hallux sesamoid. Trace first MTP joint effusion. Preserved marrow signal within the distal phalanx of the great toe. No additional sites of bone marrow edema. No additional erosions. No fracture or dislocation. Mild osteoarthritis of the first MTP joint. Ligaments Intact collateral ligaments. Muscles and Tendons Chronic denervation changes of the foot  musculature. Trace tenosynovitis of the flexor hallucis longus tendon. Soft tissues Soft tissue wound or ulceration along the  plantar surface of the foot underlying the first metatarsal head. Soft tissue swelling and edema of the great toe. There are multiple foci of susceptibility within the soft tissues of the great toe suggestive of soft tissue air. Ill-defined fluid collection along the medial aspect of the great toe proximal phalanx measuring approximately 2.7 x 0.5 x 2.6 cm suggestive of phlegmon/developing abscess. IMPRESSION: 1. Mild bone marrow edema throughout the proximal phalanx of the left great toe with possible subtle erosion along the medial base of the great toe proximal phalanx. Findings are most compatible with early acute osteomyelitis. 2. Soft tissue wound or ulceration along the plantar surface of the foot underlying the first metatarsal head. Soft tissue swelling and edema of the great toe with suspected soft tissue air. Ill-defined fluid collection along the medial aspect of the great toe proximal phalanx measuring approximately 2.7 x 0.5 x 2.6 cm suggestive of phlegmon/developing abscess. 3. Minimal subchondral marrow edema along the medial margin of the first metatarsal head. Mild marrow edema within the hallux sesamoids, more pronounced within the fibular hallux sesamoid. Findings are nonspecific and may be reactive or infectious in etiology. 4. Trace tenosynovitis of the flexor hallucis longus tendon. Electronically Signed   By: Duanne Guess D.O.   On: 10/08/2022 17:42    Anti-infectives: Anti-infectives (From admission, onward)    Start     Dose/Rate Route Frequency Ordered Stop   10/10/22 1000  valACYclovir (VALTREX) tablet 1,000 mg        1,000 mg Oral 2 times daily 10/10/22 0847 10/11/22 0959   10/08/22 2100  vancomycin (VANCOREADY) IVPB 1750 mg/350 mL  Status:  Discontinued        1,750 mg 175 mL/hr over 120 Minutes Intravenous Every 24 hours 10/07/22 2214 10/08/22 0914   10/08/22 1000  vancomycin (VANCOCIN) IVPB 1000 mg/200 mL premix        1,000 mg 200 mL/hr over 60 Minutes Intravenous  Every 12 hours 10/08/22 0914     10/08/22 0200  ceFEPIme (MAXIPIME) 2 g in sodium chloride 0.9 % 100 mL IVPB  Status:  Discontinued        2 g 200 mL/hr over 30 Minutes Intravenous Every 8 hours 10/07/22 2212 10/07/22 2215   10/08/22 0200  ceFEPIme (MAXIPIME) 2 g in sodium chloride 0.9 % 100 mL IVPB        2 g 200 mL/hr over 30 Minutes Intravenous Every 8 hours 10/07/22 2215 10/15/22 0159   10/07/22 2215  vancomycin (VANCOCIN) IVPB 1000 mg/200 mL premix        1,000 mg 200 mL/hr over 60 Minutes Intravenous  Once 10/07/22 2210 10/08/22 1039   10/07/22 1945  vancomycin (VANCOCIN) injection 1 g  Status:  Discontinued        1 g Intravenous  Once 10/07/22 1940 10/07/22 1941   10/07/22 1945  piperacillin-tazobactam (ZOSYN) IVPB 3.375 g        3.375 g 100 mL/hr over 30 Minutes Intravenous  Once 10/07/22 1940 10/07/22 2038   10/07/22 1945  vancomycin (VANCOCIN) IVPB 1000 mg/200 mL premix        1,000 mg 200 mL/hr over 60 Minutes Intravenous  Once 10/07/22 1941 10/07/22 2141       Assessment/Plan: s/p Procedure(s): AMPUTATION TOE (Left) Assessment: Stable status post amputation and debridement left foot.  Plan: Saline wet-to-dry gauze packed within the plantar wound with Betadine  gauze and sterile dressing to the amputation site.  Spoke with Dr. Ether Griffins.  Related that there was minimal bleeding during surgery.  At this point I think it would be prudent to obtain vascular consult to check his circulation status.  On review of the chart vascular consult has already been placed by Dr.Wieting. Continue to follow closely.  LOS: 3 days    Ricci Barker 10/10/2022

## 2022-10-10 NOTE — Progress Notes (Signed)
Progress Note   Patient: Jared Castillo EAV:[id number] DOB: 1950/05/18 DOA: 10/07/2022     3 DOS: the patient was seen and examined on 10/10/2022   Brief hospital course: 72 y.o. 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  Patient started having redness, swelling, skin breakdown and increased pain of the foot prompting him to see his podiatrist.  He denies fever or chills. ED course and data review: Vitals within normal limits.  Labs with WBC 10,700 and lactic acid 2.1.  Sed rate 74.  Glucose 432 with sodium 128. X-ray showed soft tissue swelling of the first ray without erosive changes. Patient started on Zosyn and vancomycin, given Dilaudid for pain. Hospitalist consulted for admission.  7/6.  Continue IV antibiotics.  MRI showing early acute osteomyelitis of the proximal phalanx of the left great toe and 2.7 x 0.5 x 2.6 cm of developing abscess.  ABI done but still pending 7/7.  Podiatry took to the operating room for amputation of left great toe and full-thickness excisional debridement of ulcer.  ABI still pending. 7/8.  Vascular surgery consultation for the way the radiology team read the ABI.  Vascular team to take to angiogram tomorrow.  Patient complaining of foot pain.  Assessment and Plan: Osteomyelitis (HCC) Acute osteomyelitis first left great toe.  Amputation of the first great toe 7/7 by podiatry.  Continue IV antibiotics  Cellulitis and abscess of toe Went to OR 7/7 for debridement of abscess.  Continue IV antibiotics.  Diabetic foot ulcer (HCC) Had debridement of diabetic foot ulcer in the operating room on 7/7.  PAD (peripheral artery disease)  (HCC) Case discussed with vascular surgery and they we will set up for an angiogram tomorrow.  Uncontrolled type 2 diabetes mellitus with hyperglycemia, with long-term current use of insulin (HCC) Diabetic neuropathy Pseudohyponatremia Hemoglobin A1c elevated at 12.4.  Patient currently on Semglee insulin 20 units twice a day and sliding scale.  Will add NovoLog prior to meals also.  Essential hypertension Continue metoprolol and lisinopril without HCTZ (with low sodium)  Lactic acidosis Hold metformin.  Hyponatremia Potentially pseudohyponatremia with elevated sugars versus hyponatremia secondary to hydrochlorothiazide.  Hypokalemia Replaced        Subjective: Patient complains of some foot pain.  Discussed ABI report with vascular surgery.  Admitted with cellulitis found to have osteomyelitis and abscess.  Physical Exam: Vitals:   10/10/22 0439 10/10/22 0856 10/10/22 1051 10/10/22 1212  BP: 99/66 110/69 97/69 (!) 111/59  Pulse: (!) 57 64 61 62  Resp: 20 15    Temp: 97.6 F (36.4 C) 97.8 F (36.6 C)    TempSrc:      SpO2: 95% 97% 99% 98%  Weight:      Height:       Physical Exam HENT:     Head: Normocephalic.     Mouth/Throat:     Pharynx: No oropharyngeal exudate.  Eyes:     General: Lids are normal.     Conjunctiva/sclera: Conjunctivae normal.  Cardiovascular:     Rate and Rhythm: Normal rate and regular rhythm.     Heart sounds: Normal heart sounds, S1 normal and S2 normal.  Pulmonary:     Breath sounds: No  decreased breath sounds, wheezing, rhonchi or rales.  Abdominal:     Palpations: Abdomen is soft.     Tenderness: There is no abdominal tenderness.  Musculoskeletal:     Right lower leg: No swelling.     Left lower leg: No swelling.  Skin:    General: Skin is warm.  Neurological:     Mental Status: He is alert and oriented to person, place, and time.        Data Reviewed: Sodium 134, creatinine 0.99, hemoglobin 11.8, hemoglobin A1c  12.4  Family Communication: Spoke with wife at the phone  Disposition: Status is: Inpatient Remains inpatient appropriate because: With reading of ABI, vascular surgery will do an angiogram tomorrow.  Planned Discharge Destination: Home    Time spent: 28 minutes Case discussed with vascular surgery.  Author: Alford Highland, MD 10/10/2022 1:28 PM  For on call review [email or web address].

## 2022-10-10 NOTE — TOC Progression Note (Signed)
Transition of Care [facility]) - Progression Note    Patient Details  Name: Jared Castillo MRN: [id number] Date of Birth: 1950/08/16  Transition of Care [facility]) CM/SW Contact  Marlowe Sax, RN Phone Number: 10/10/2022, 2:04 PM  Clinical Narrative:    Spoke with the patient, he stated  that he talked to the [facility] comp department of [employer] Friday before coming into the hospital and they told him that they claim was approved, I asked him if he had the case worker's name and number and a claim number He stated he has it at home, I explained that anything related to the injury would have to go thru [facility] comp including Medications He said to call [employer] in [place] then hit number 8, I called [employer] in [place] at [phone] and dialed option 8 person's name is Gwen, the phone rang continuously and noone answered, no VM to leave a message.        Expected Discharge Plan and Services                                               Social Determinants of Health (SDOH) Interventions SDOH Screenings   Food Insecurity: Food Insecurity Present (10/07/2022)  Housing: Low Risk  (10/07/2022)  Transportation Needs: No Transportation Needs (10/07/2022)  Utilities: At Risk (10/07/2022)  Tobacco Use: Medium Risk (10/10/2022)    Readmission Risk Interventions     No data to display

## 2022-10-10 NOTE — H&P (View-Only) (Signed)
Hospital Consult    Reason for Consult:  Left Lower extremity Osteo with toe amputation.  Requesting Physician:  Dr Alford Highland MD MRN #:  [id number]  History of Present Illness: This is a 72 y.o. male 72 y.o. 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  Patient started having redness, swelling, skin breakdown and increased pain of the foot prompting him to see his podiatrist.  He denies fever or chills.   Vascular Surgery was consulted due to poor results from lower extremity ABI's. He currently has his left foot bandaged post surgery from yesterday. He endorses soreness and some pain when standing. No other complaints overnight. He denies and chest pain, shortness of breath nausea vomiting or diarrhea. Vitals all remain stable  Past Medical History:  Diagnosis Date   Chronic kidney disease    stones   Diabetes mellitus without complication (HCC)    GERD (gastroesophageal reflux disease)    Hypertension    Shingles rash 13   dx yesterday 03/21/11    Past Surgical History:  Procedure Laterality Date   AMPUTATION TOE Left 10/09/2022   Procedure: AMPUTATION TOE;  Surgeon: Gwyneth Revels, DPM;  Location: [facility] ORS;  Service: Orthopedics/Podiatry;  Laterality: Left;   APPENDECTOMY     child   IRRIGATION AND DEBRIDEMENT FOOT Right 08/23/2019   Procedure: RIGHT FOOT IRRIGATION AND DEBRIDEMENT, RIGHT ACHILLES TENDON LENGTHENING,EXCISION RIGHT FIRST METATARSAL;  Surgeon: Gwyneth Revels, DPM;  Location: [facility] ORS;  Service: Podiatry;  Laterality: Right;   IRRIGATION AND DEBRIDEMENT FOOT Right 02/16/2022   Procedure: IRRIGATION AND DEBRIDEMENT FOOT;  Surgeon: Gwyneth Revels, DPM;  Location: [facility] ORS;  Service: Podiatry;  Laterality: Right;   TOE AMPUTATION  79   rt great and second   TOTAL KNEE ARTHROPLASTY  03/30/2011   Procedure: TOTAL KNEE ARTHROPLASTY;  Surgeon: Nadara Mustard, MD;  Location: [facility] OR;  Service: Orthopedics;  Laterality: Right;  Right Total Knee Arthroplastly   TRANSMETATARSAL AMPUTATION Right 02/16/2022   Procedure: TRANSMETATARSAL AMPUTATION;  Surgeon: Gwyneth Revels, DPM;  Location: [facility] ORS;  Service: Podiatry;  Laterality: Right;    Allergies  Allergen Reactions   Trazodone Other (See Comments)    Nightmares    Prior to Admission medications   Medication Sig Start Date End Date Taking? Authorizing Provider  albuterol (VENTOLIN HFA) 108 (90 Base) MCG/ACT inhaler Inhale 1-2 puffs into the lungs every 6 (six) hours as needed for wheezing or shortness of breath.   Yes [provider]  fluticasone (VERAMYST) 27.5 MCG/SPRAY nasal spray Place 2 sprays into the nose daily.   Yes [provider]  gabapentin (NEURONTIN) 300 MG capsule Take 600 mg by mouth 2 (two) times daily. 2 caps TID   Yes [provider]  ibuprofen (ADVIL) 200 MG tablet Take 2 tablets (400 mg total) by mouth every 6 (six) hours as needed for moderate pain or mild pain. 02/17/22  Yes Sunnie Nielsen, DO  insulin detemir (LEVEMIR) 100 UNIT/ML injection Inject 38 Units into the skin 2 (two) times daily.   Yes [provider]  insulin lispro (HUMALOG) 100 UNIT/ML injection Inject 0-10 Units into the skin 3 (three) times daily as needed for high blood sugar. Sliding  scale as needed for BG over 200.   Yes [provider]  lisinopril-hydrochlorothiazide (ZESTORETIC) 20-25 MG tablet Take 0.5 tablets by mouth daily.   Yes [provider]  metFORMIN (GLUCOPHAGE) 1000 MG tablet Take 1,000 mg by mouth 2 (two) times daily with a meal.   Yes [provider]  metoprolol succinate (TOPROL-XL) 50 MG 24 hr tablet Take 50 mg  by mouth daily. Take with or immediately following a meal.   Yes [provider]  Multiple Vitamin (MULTIVITAMIN WITH MINERALS) TABS tablet Take 1 tablet by mouth daily. 02/18/22  Yes Sunnie Nielsen, DO  omeprazole (PRILOSEC) 20 MG capsule Take 20 mg by mouth 2 (two) times daily.   Yes [provider]  rosuvastatin (CRESTOR) 40 MG tablet Take 40 mg by mouth daily.   Yes [provider]  doxycycline (VIBRA-TABS) 100 MG tablet Take 1 tablet (100 mg total) by mouth 2 (two) times daily. Take first dose evening 02/17/22 and start twice daily dosing tomorrow 02/18/22 Patient not taking: Reported on 10/07/2022 02/17/22   Sunnie Nielsen, DO  Sodium Chloride Flush (SALINE FLUSH) 0.9 % SOLN As needed for dressing changes Patient not taking: Reported on 10/07/2022 02/17/22   Sunnie Nielsen, DO    Social History   Socioeconomic History   Marital status: Married    Spouse name: Not on file   Number of children: Not on file   Years of education: Not on file   Highest education level: Not on file  Occupational History   Not on file  Tobacco Use   Smoking status: Former   Smokeless tobacco: Never  Vaping Use   Vaping Use: Never used  Substance and Sexual Activity   Alcohol use: Not Currently    Alcohol/week: 2.0 - 3.0 standard drinks of alcohol    Types: 2 - 3 Cans of beer per week    Comment: no alcohol in a year   Drug use: No   Sexual activity: Not on file  Other Topics Concern   Not on file  Social History Narrative   Not on file   Social Determinants of Health   Financial Resource Strain: Not on file  Food Insecurity: Food Insecurity Present (10/07/2022)   Hunger Vital Sign    Worried About Running Out of Food in the Last Year: Sometimes true    Ran Out of Food in the Last Year: Sometimes true  Transportation Needs: No Transportation Needs (10/07/2022)   PRAPARE - Administrator, Civil Service (Medical): No    Lack of Transportation  (Non-Medical): No  Physical Activity: Not on file  Stress: Not on file  Social Connections: Not on file  Intimate Partner Violence: Not At Risk (10/07/2022)   Humiliation, Afraid, Rape, and Kick questionnaire    Fear of Current or Ex-Partner: No    Emotionally Abused: No    Physically Abused: No    Sexually Abused: No     No family history on file.  ROS: Otherwise negative unless mentioned in HPI  Physical Examination  Vitals:   10/10/22 0439 10/10/22 0856  BP: 99/66 110/69  Pulse: (!) 57 64  Resp: 20 15  Temp: 97.6 F (36.4 C) 97.8 F (36.6 C)  SpO2: 95% 97%   Body mass index is 24.41 kg/m.  General:  WDWN in NAD Gait: Not observed HENT: WNL, normocephalic Pulmonary: normal non-labored breathing, without Rales, rhonchi,  wheezing Cardiac: regular, without  Murmurs, rubs or gallops; without carotid bruits Abdomen: Positive  bowel sounds, soft, NT/ND, no masses Skin: without rashes Vascular Exam/Pulses: Palpable pulses except DP/PT in the left lower extremity.  Extremities: with ischemic changes, without Gangrene , without cellulitis; without open wounds;  Musculoskeletal: no muscle wasting or atrophy  Neurologic: A&O X 3;  No focal weakness or paresthesias are detected; speech is fluent/normal Psychiatric:  The pt has Normal affect. Lymph:  Unremarkable  CBC    Component Value Date/Time   WBC 8.4 10/08/2022 0443   RBC 3.68 (L) 10/08/2022 0443   HGB 11.8 (L) 10/10/2022 0621   HGB 14.2 12/25/2013 1933   HCT 31.1 (L) 10/08/2022 0443   HCT 42.2 12/25/2013 1933   PLT 259 10/08/2022 0443   PLT 172 12/25/2013 1933   MCV 84.5 10/08/2022 0443   MCV 92 12/25/2013 1933   MCH 29.9 10/08/2022 0443   MCHC 35.4 10/08/2022 0443   RDW 12.4 10/08/2022 0443   RDW 13.6 12/25/2013 1933   LYMPHSABS 1.4 02/14/2022 0919   MONOABS 0.7 02/14/2022 0919   EOSABS 0.2 02/14/2022 0919   BASOSABS 0.0 02/14/2022 0919    BMET    Component Value Date/Time   NA 134 (L) 10/10/2022  0621   NA 137 11/29/2012 0902   K 4.0 10/10/2022 0621   K 3.8 11/29/2012 0902   CL 100 10/10/2022 0621   CL 104 11/29/2012 0902   CO2 26 10/10/2022 0621   CO2 28 11/29/2012 0902   GLUCOSE 181 (H) 10/10/2022 0621   GLUCOSE 131 (H) 11/29/2012 0902   BUN 20 10/10/2022 0621   BUN 19 (H) 11/29/2012 0902   CREATININE 0.99 10/10/2022 0621   CREATININE 0.85 11/29/2012 0902   CALCIUM 8.9 10/10/2022 0621   CALCIUM 8.5 11/29/2012 0902   GFRNONAA >60 10/10/2022 0621   GFRNONAA >60 11/29/2012 0902   GFRAA >60 08/25/2019 0737   GFRAA >60 11/29/2012 0902    COAGS: Lab Results  Component Value Date   INR 1.0 08/22/2019   INR 0.90 08/07/2015   INR 1.45 04/02/2011     Non-Invasive Vascular Imaging:   EXAM: NONINVASIVE PHYSIOLOGIC VASCULAR STUDY OF BILATERAL LOWER EXTREMITIES   TECHNIQUE: Evaluation of both lower extremities were performed at rest, including calculation of ankle-brachial indices with single level Doppler, pressure and pulse volume recording.   COMPARISON:  02/15/2022   FINDINGS: Right ABI:  1.22, previously 1.34   Left ABI: 1.06 (noncompressible posterior tibial), previously noncompressible   Right Lower Extremity:  Mixed arterial waveforms at the ankle.   Left Lower Extremity:  Mixed arterial waveforms at the ankle.   IMPRESSION: Technically normal ankle-brachial indices, however given history of diabetes, noncompressible left posterior tibial, previously noncompressible left ankle-brachial indices,, and mixed waveforms, today's normal findings are likely falsely elevated. Consider lower extremity arterial duplex versus CTA runoff for further characterization.  Statin:  Yes.   Beta Blocker:  Yes.   Aspirin:  No. ACEI:  Yes.   ARB:  No. CCB use:  No Other antiplatelets/anticoagulants:  No.    ASSESSMENT/PLAN: This is a 72 y.o. male who was admitted to [facility] for worsening redness swelling and drainage to his left foot.  Longstanding history of  diabetes with neuropathy.  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  Admitted with worsening redness cellulitis and area of superficial necrosis of skin to the left great toe.  He states he has had pain in this area of recent. Patient underwent left great toe amputation with ulcer  debridement on 10/09/22.   Vascular Surgery was consulted after patient had ultrasounds with ABI's that were poor. After reviewing the Ultrasound the patient would benefit from a left lower extremity angiogram.   Vascular surgery plans on taking the patient to the vascular lab on Tuesday 10/11/2022 for a left lower extremity Angiogram with possible intervention. I discussed in detail with the patient the procedure, benefits, risks and complications. He verbalizes his understanding. I answered all the patients questions. He would like to proceed as soon as possible.    -I discussed the plan in detail with Dr Festus Barren MD and he agrees with the plan.    Marcie Bal [facility] 10/10/2022 9:04 AM

## 2022-10-10 NOTE — Progress Notes (Addendum)
Mobility Specialist - Progress Note   10/10/22 1154  Mobility  Activity Dangled on edge of bed  Level of Assistance Independent  Assistive Device None  Range of Motion/Exercises Active  Activity Response Tolerated well  $Mobility charge 1 Mobility  Mobility Specialist Start Time (ACUTE ONLY) 1140  Mobility Specialist Stop Time (ACUTE ONLY) 1153  Mobility Specialist Time Calculation (min) (ACUTE ONLY) 13 min   Pt supine upon entry, utilizing RA. Pt expressed having pain in LLE due to amputation surgery the previous day. Pt has not amb since surgery, MS opted for bed level exercise. Pt transferred to the EOB indep, min Vcs to remain NWB on the LLE. Pt dangled EOB and completed a set of seated BLE exercises. Pt performed seated leg raises x4, marches x6, pillow squeezes x4, ankle pumps and circles-- tolerated well. Pt returned supine indep, left with needs within reach.   Zetta Bills Mobility Specialist 10/10/22 12:00 PM

## 2022-10-10 NOTE — Consult Note (Signed)
Hospital Consult    Reason for Consult:  Left Lower extremity Osteo with toe amputation.  Requesting Physician:  Dr richard Wieting MD MRN #:  [id number]  History of Present Illness: This is a 72 y.o. male 72 y.o. 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  Patient started having redness, swelling, skin breakdown and increased pain of the foot prompting him to see his podiatrist.  He denies fever or chills.   Vascular Surgery was consulted due to poor results from lower extremity ABI's. He currently has his left foot bandaged post surgery from yesterday. He endorses soreness and some pain when standing. No other complaints overnight. He denies and chest pain, shortness of breath nausea vomiting or diarrhea. Vitals all remain stable  Past Medical History:  Diagnosis Date   Chronic kidney disease    stones   Diabetes mellitus without complication (HCC)    GERD (gastroesophageal reflux disease)    Hypertension    Shingles rash 12   dx yesterday 03/21/11    Past Surgical History:  Procedure Laterality Date   AMPUTATION TOE Left 10/09/2022   Procedure: AMPUTATION TOE;  Surgeon: Fowler, Justin, DPM;  Location: [facility] ORS;  Service: Orthopedics/Podiatry;  Laterality: Left;   APPENDECTOMY     child   IRRIGATION AND DEBRIDEMENT FOOT Right 08/23/2019   Procedure: RIGHT FOOT IRRIGATION AND DEBRIDEMENT, RIGHT ACHILLES TENDON LENGTHENING,EXCISION RIGHT FIRST METATARSAL;  Surgeon: Fowler, Justin, DPM;  Location: [facility] OR[facility];  Service: Podiatry;  Laterality: Right;   IRRIGATION AND DEBRIDEMENT FOOT Right 02/16/2022   Procedure: IRRIGATION AND DEBRIDEMENT FOOT;  Surgeon: Fowler,  Justin, DPM;  Location: [facility] OR[facility];  Service: Podiatry;  Laterality: Right;   TOE AMPUTATION  79   rt great and second   TOTAL KNEE ARTHROPLASTY  03/30/2011   Procedure: TOTAL KNEE ARTHROPLASTY;  Surgeon: Marcus V Duda, MD;  Location: [facility];  Service: Orthopedics;  Laterality: Right;  Right Total Knee Arthroplastly   TRANSMETATARSAL AMPUTATION Right 02/16/2022   Procedure: TRANSMETATARSAL AMPUTATION;  Surgeon: Fowler, Justin, DPM;  Location: [facility];  Service: Podiatry;  Laterality: Right;    Allergies  Allergen Reactions   Trazodone Other (See Comments)    Nightmares    Prior to Admission medications   Medication Sig Start Date End Date Taking? Authorizing Provider  albuterol (VENTOLIN HFA) 108 (90 Base) MCG/ACT inhaler Inhale 1-2 puffs into the lungs every 6 (six) hours as needed for wheezing or shortness of breath.   Yes [provider]  fluticasone (VERAMYST) 27.5 MCG/SPRAY nasal spray Place 2 sprays into the nose daily.   Yes [provider]  gabapentin (NEURONTIN) 300 MG capsule Take 600 mg by mouth 2 (two) times daily. 2 caps TID   Yes [provider]  ibuprofen (ADVIL) 200 MG tablet Take 2 tablets (400 mg total) by mouth every 6 (six) hours as needed for moderate pain or mild pain. 02/17/22  Yes Alexander, Natalie, DO  insulin detemir (LEVEMIR) 100 UNIT/ML injection Inject 38 Units into the skin 2 (two) times daily.   Yes [provider]  insulin lispro (HUMALOG) 100 UNIT/ML injection Inject 0-10 Units into the skin 3 (three) times daily as needed for high blood sugar. Sliding   scale as needed for BG over 200.   Yes [provider]  lisinopril-hydrochlorothiazide (ZESTORETIC) 20-25 MG tablet Take 0.5 tablets by mouth daily.   Yes [provider]  metFORMIN (GLUCOPHAGE) 1000 MG tablet Take 1,000 mg by mouth 2 (two) times daily with a meal.   Yes [provider]  metoprolol succinate (TOPROL-XL) 50 MG 24 hr tablet Take 50 mg  by mouth daily. Take with or immediately following a meal.   Yes [provider]  Multiple Vitamin (MULTIVITAMIN WITH MINERALS) TABS tablet Take 1 tablet by mouth daily. 02/18/22  Yes Alexander, Natalie, DO  omeprazole (PRILOSEC) 20 MG capsule Take 20 mg by mouth 2 (two) times daily.   Yes [provider]  rosuvastatin (CRESTOR) 40 MG tablet Take 40 mg by mouth daily.   Yes [provider]  doxycycline (VIBRA-TABS) 100 MG tablet Take 1 tablet (100 mg total) by mouth 2 (two) times daily. Take first dose evening 02/17/22 and start twice daily dosing tomorrow 02/18/22 Patient not taking: Reported on 10/07/2022 02/17/22   Alexander, Natalie, DO  Sodium Chloride Flush (SALINE FLUSH) 0.9 % SOLN As needed for dressing changes Patient not taking: Reported on 10/07/2022 02/17/22   Alexander, Natalie, DO    Social History   Socioeconomic History   Marital status: Married    Spouse name: Not on file   Number of children: Not on file   Years of education: Not on file   Highest education level: Not on file  Occupational History   Not on file  Tobacco Use   Smoking status: Former   Smokeless tobacco: Never  Vaping Use   Vaping Use: Never used  Substance and Sexual Activity   Alcohol use: Not Currently    Alcohol/week: 2.0 - 3.0 standard drinks of alcohol    Types: 2 - 3 Cans of beer per week    Comment: no alcohol in a year   Drug use: No   Sexual activity: Not on file  Other Topics Concern   Not on file  Social History Narrative   Not on file   Social Determinants of Health   Financial Resource Strain: Not on file  Food Insecurity: Food Insecurity Present (10/07/2022)   Hunger Vital Sign    Worried About Running Out of Food in the Last Year: Sometimes true    Ran Out of Food in the Last Year: Sometimes true  Transportation Needs: No Transportation Needs (10/07/2022)   PRAPARE - Transportation    Lack of Transportation (Medical): No    Lack of Transportation  (Non-Medical): No  Physical Activity: Not on file  Stress: Not on file  Social Connections: Not on file  Intimate Partner Violence: Not At Risk (10/07/2022)   Humiliation, Afraid, Rape, and Kick questionnaire    Fear of Current or Ex-Partner: No    Emotionally Abused: No    Physically Abused: No    Sexually Abused: No     No family history on file.  ROS: Otherwise negative unless mentioned in HPI  Physical Examination  Vitals:   10/10/22 0439 10/10/22 0856  BP: 99/66 110/69  Pulse: (!) 57 64  Resp: 20 15  Temp: 97.6 F (36.4 C) 97.8 F (36.6 C)  SpO2: 95% 97%   Body mass index is 24.41 kg/m.  General:  WDWN in NAD Gait: Not observed HENT: WNL, normocephalic Pulmonary: normal non-labored breathing, without Rales, rhonchi,  wheezing Cardiac: regular, without  Murmurs, rubs or gallops; without carotid bruits Abdomen: Positive   bowel sounds, soft, NT/ND, no masses Skin: without rashes Vascular Exam/Pulses: Palpable pulses except DP/PT in the left lower extremity.  Extremities: with ischemic changes, without Gangrene , without cellulitis; without open wounds;  Musculoskeletal: no muscle wasting or atrophy  Neurologic: A&O X 3;  No focal weakness or paresthesias are detected; speech is fluent/normal Psychiatric:  The pt has Normal affect. Lymph:  Unremarkable  CBC    Component Value Date/Time   WBC 8.4 10/08/2022 0443   RBC 3.68 (L) 10/08/2022 0443   HGB 11.8 (L) 10/10/2022 0621   HGB 14.2 12/25/2013 1933   HCT 31.1 (L) 10/08/2022 0443   HCT 42.2 12/25/2013 1933   PLT 259 10/08/2022 0443   PLT 172 12/25/2013 1933   MCV 84.5 10/08/2022 0443   MCV 92 12/25/2013 1933   MCH 29.9 10/08/2022 0443   MCHC 35.4 10/08/2022 0443   RDW 12.4 10/08/2022 0443   RDW 13.6 12/25/2013 1933   LYMPHSABS 1.4 02/14/2022 0919   MONOABS 0.7 02/14/2022 0919   EOSABS 0.2 02/14/2022 0919   BASOSABS 0.0 02/14/2022 0919    BMET    Component Value Date/Time   NA 134 (L) 10/10/2022  0621   NA 137 11/29/2012 0902   K 4.0 10/10/2022 0621   K 3.8 11/29/2012 0902   CL 100 10/10/2022 0621   CL 104 11/29/2012 0902   CO2 26 10/10/2022 0621   CO2 28 11/29/2012 0902   GLUCOSE 181 (H) 10/10/2022 0621   GLUCOSE 131 (H) 11/29/2012 0902   BUN 20 10/10/2022 0621   BUN 19 (H) 11/29/2012 0902   CREATININE 0.99 10/10/2022 0621   CREATININE 0.85 11/29/2012 0902   CALCIUM 8.9 10/10/2022 0621   CALCIUM 8.5 11/29/2012 0902   GFRNONAA >60 10/10/2022 0621   GFRNONAA >60 11/29/2012 0902   GFRAA >60 08/25/2019 0737   GFRAA >60 11/29/2012 0902    COAGS: Lab Results  Component Value Date   INR 1.0 08/22/2019   INR 0.90 08/07/2015   INR 1.45 04/02/2011     Non-Invasive Vascular Imaging:   EXAM: NONINVASIVE PHYSIOLOGIC VASCULAR STUDY OF BILATERAL LOWER EXTREMITIES   TECHNIQUE: Evaluation of both lower extremities were performed at rest, including calculation of ankle-brachial indices with single level Doppler, pressure and pulse volume recording.   COMPARISON:  02/15/2022   FINDINGS: Right ABI:  1.22, previously 1.34   Left ABI: 1.06 (noncompressible posterior tibial), previously noncompressible   Right Lower Extremity:  Mixed arterial waveforms at the ankle.   Left Lower Extremity:  Mixed arterial waveforms at the ankle.   IMPRESSION: Technically normal ankle-brachial indices, however given history of diabetes, noncompressible left posterior tibial, previously noncompressible left ankle-brachial indices,, and mixed waveforms, today's normal findings are likely falsely elevated. Consider lower extremity arterial duplex versus CTA runoff for further characterization.  Statin:  Yes.   Beta Blocker:  Yes.   Aspirin:  No. ACEI:  Yes.   ARB:  No. CCB use:  No Other antiplatelets/anticoagulants:  No.    ASSESSMENT/PLAN: This is a 72 y.o. male who was admitted to [facility] for worsening redness swelling and drainage to his left foot.  Longstanding history of  diabetes with neuropathy.  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  Admitted with worsening redness cellulitis and area of superficial necrosis of skin to the left great toe.  He states he has had pain in this area of recent. Patient underwent left great toe amputation with ulcer   debridement on 10/09/22.   Vascular Surgery was consulted after patient had ultrasounds with ABI's that were poor. After reviewing the Ultrasound the patient would benefit from a left lower extremity angiogram.   Vascular surgery plans on taking the patient to the vascular lab on Tuesday 10/11/2022 for a left lower extremity Angiogram with possible intervention. I discussed in detail with the patient the procedure, benefits, risks and complications. He verbalizes his understanding. I answered all the patients questions. He would like to proceed as soon as possible.    -I discussed the plan in detail with Dr Jason Dew MD and he agrees with the plan.    Qamar Rosman R [facility] 10/10/2022 9:04 AM  

## 2022-10-11 ENCOUNTER — Encounter
Admission: EM | Disposition: A | Discharge status: Home / Self Care | Payer: Self-pay | Source: Home / Self Care | Attending: Internal Medicine

## 2022-10-11 DIAGNOSIS — L089 Local infection of the skin and subcutaneous tissue, unspecified: Secondary | ICD-10-CM

## 2022-10-11 DIAGNOSIS — L03032 Cellulitis of left toe: Secondary | ICD-10-CM | POA: Diagnosis not present

## 2022-10-11 DIAGNOSIS — E08621 Diabetes mellitus due to underlying condition with foot ulcer: Secondary | ICD-10-CM | POA: Diagnosis not present

## 2022-10-11 DIAGNOSIS — Z89422 Acquired absence of other left toe(s): Secondary | ICD-10-CM

## 2022-10-11 DIAGNOSIS — I10 Essential (primary) hypertension: Secondary | ICD-10-CM | POA: Diagnosis not present

## 2022-10-11 DIAGNOSIS — I70245 Atherosclerosis of native arteries of left leg with ulceration of other part of foot: Secondary | ICD-10-CM

## 2022-10-11 DIAGNOSIS — E876 Hypokalemia: Secondary | ICD-10-CM

## 2022-10-11 DIAGNOSIS — M86072 Acute hematogenous osteomyelitis, left ankle and foot: Secondary | ICD-10-CM | POA: Diagnosis not present

## 2022-10-11 DIAGNOSIS — L97402 Non-pressure chronic ulcer of unspecified heel and midfoot with fat layer exposed: Secondary | ICD-10-CM

## 2022-10-11 DIAGNOSIS — E872 Acidosis, unspecified: Secondary | ICD-10-CM

## 2022-10-11 DIAGNOSIS — E871 Hypo-osmolality and hyponatremia: Secondary | ICD-10-CM

## 2022-10-11 DIAGNOSIS — L97529 Non-pressure chronic ulcer of other part of left foot with unspecified severity: Secondary | ICD-10-CM

## 2022-10-11 DIAGNOSIS — Z794 Long term (current) use of insulin: Secondary | ICD-10-CM

## 2022-10-11 DIAGNOSIS — L02612 Cutaneous abscess of left foot: Secondary | ICD-10-CM

## 2022-10-11 HISTORY — PX: LOWER EXTREMITY ANGIOGRAPHY: CATH118251

## 2022-10-11 LAB — GLUCOSE, CAPILLARY
Glucose-Capillary: 210 mg/dL — ABNORMAL HIGH (ref 70–99)
Glucose-Capillary: 134 mg/dL — ABNORMAL HIGH (ref 70–99)
Glucose-Capillary: 67 mg/dL — ABNORMAL LOW (ref 70–99)
Glucose-Capillary: 144 mg/dL — ABNORMAL HIGH (ref 70–99)
Glucose-Capillary: 125 mg/dL — ABNORMAL HIGH (ref 70–99)
Glucose-Capillary: 339 mg/dL — ABNORMAL HIGH (ref 70–99)

## 2022-10-11 LAB — VANCOMYCIN, TROUGH: Vancomycin Tr: 14 ug/mL — ABNORMAL LOW (ref 15–20)

## 2022-10-11 LAB — CULTURE, BLOOD (ROUTINE X 2)
Culture: NO GROWTH
Culture: NO GROWTH

## 2022-10-11 SURGERY — LOWER EXTREMITY ANGIOGRAPHY
Anesthesia: Moderate Sedation | Laterality: Left

## 2022-10-11 MED ORDER — ONDANSETRON HCL 4 MG/2ML IJ SOLN: 4.0000 mg | Freq: Four times a day (QID) | INTRAMUSCULAR | Status: DC | PRN

## 2022-10-11 MED ORDER — HEPARIN (PORCINE) IN NACL 1000-0.9 UT/500ML-% IV SOLN
INTRAVENOUS | Status: DC | PRN
  Administered 2022-10-11: 500 mL
  Administered 2022-10-11: 1000 mL

## 2022-10-11 MED ORDER — MIDAZOLAM HCL 2 MG/2ML IJ SOLN
INTRAMUSCULAR | Status: AC
  Filled 2022-10-11: qty 2

## 2022-10-11 MED ORDER — DEXTROSE 50 % IV SOLN
12.5000 g | Freq: Once | INTRAVENOUS | Status: AC
  Administered 2022-10-11: 12.5 g via INTRAVENOUS

## 2022-10-11 MED ORDER — HYDROMORPHONE HCL 1 MG/ML IJ SOLN: 1.0000 mg | Freq: Once | INTRAMUSCULAR | Status: DC | PRN

## 2022-10-11 MED ORDER — SODIUM CHLORIDE 0.9 % IV SOLN: INTRAVENOUS | Status: DC

## 2022-10-11 MED ORDER — IODIXANOL 320 MG/ML IV SOLN
INTRAVENOUS | Status: DC | PRN
  Administered 2022-10-11: 35 mL via INTRA_ARTERIAL

## 2022-10-11 MED ORDER — LIDOCAINE-EPINEPHRINE (PF) 1 %-1:200000 IJ SOLN
INTRAMUSCULAR | Status: DC | PRN
  Administered 2022-10-11: 20 mL
  Administered 2022-10-11: 10 mL

## 2022-10-11 MED ORDER — MIDAZOLAM HCL 2 MG/ML PO SYRP: 8.0000 mg | ORAL_SOLUTION | Freq: Once | ORAL | Status: DC | PRN

## 2022-10-11 MED ORDER — FENTANYL CITRATE (PF) 100 MCG/2ML IJ SOLN
INTRAMUSCULAR | Status: DC | PRN
  Administered 2022-10-11: 50 ug via INTRAVENOUS

## 2022-10-11 MED ORDER — CEFAZOLIN SODIUM-DEXTROSE 2-4 GM/100ML-% IV SOLN
2.0000 g | INTRAVENOUS | Status: AC
  Administered 2022-10-11: 2 g via INTRAVENOUS

## 2022-10-11 MED ORDER — METHYLPREDNISOLONE SODIUM SUCC 125 MG IJ SOLR: 125.0000 mg | Freq: Once | INTRAMUSCULAR | Status: DC | PRN

## 2022-10-11 MED ORDER — DEXTROSE 50 % IV SOLN
INTRAVENOUS | Status: AC
  Filled 2022-10-11: qty 50

## 2022-10-11 MED ORDER — FENTANYL CITRATE PF 50 MCG/ML IJ SOSY: 12.5000 ug | PREFILLED_SYRINGE | Freq: Once | INTRAMUSCULAR | Status: DC | PRN

## 2022-10-11 MED ORDER — MIDAZOLAM HCL 2 MG/2ML IJ SOLN
INTRAMUSCULAR | Status: DC | PRN
  Administered 2022-10-11: 2 mg via INTRAVENOUS

## 2022-10-11 MED ORDER — FENTANYL CITRATE (PF) 100 MCG/2ML IJ SOLN
INTRAMUSCULAR | Status: AC
  Filled 2022-10-11: qty 2

## 2022-10-11 MED ORDER — DIPHENHYDRAMINE HCL 50 MG/ML IJ SOLN: 50.0000 mg | Freq: Once | INTRAMUSCULAR | Status: DC | PRN

## 2022-10-11 MED ORDER — FAMOTIDINE 20 MG PO TABS: 40.0000 mg | ORAL_TABLET | Freq: Once | ORAL | Status: DC | PRN

## 2022-10-11 MED ORDER — HEPARIN SODIUM (PORCINE) 1000 UNIT/ML IJ SOLN
INTRAMUSCULAR | Status: AC
  Filled 2022-10-11: qty 10

## 2022-10-11 SURGICAL SUPPLY — 7 items
CATH ANGIO 5F PIGTAIL 65CM (CATHETERS) IMPLANT
DEVICE STARCLOSE SE CLOSURE (Vascular Products) IMPLANT
PACK ANGIOGRAPHY (CUSTOM PROCEDURE TRAY) ×1 IMPLANT
SHEATH BRITE TIP 5FRX11 (SHEATH) IMPLANT
SYR MEDRAD MARK 7 150ML (SYRINGE) IMPLANT
TUBING CONTRAST HIGH PRESS 72 (TUBING) IMPLANT
WIRE GUIDERIGHT .035X150 (WIRE) IMPLANT

## 2022-10-11 NOTE — Progress Notes (Signed)
Daily Progress Note   Subjective  - Day of Surgery  F/u left great toe amputation.  S/P angio.    Objective Vitals:   10/11/22 1430 10/11/22 1445 10/11/22 1500 10/11/22 1526  BP: 123/66 131/76 113/72 113/76  Pulse: 64 64 (!) 59 (!) 58  Resp: 14 11 15 16   Temp:    98 F (36.7 C)  TempSrc:      SpO2: 93% 92% 93% 96%  Weight:      Height:        Physical Exam: Good flow on angio.  Dressing changed.  Good perfusion of skin flaps.  Laboratory CBC    Component Value Date/Time   WBC 8.4 10/08/2022 0443   HGB 11.8 (L) 10/10/2022 0621   HGB 14.2 12/25/2013 1933   HCT 31.1 (L) 10/08/2022 0443   HCT 42.2 12/25/2013 1933   PLT 259 10/08/2022 0443   PLT 172 12/25/2013 1933    BMET    Component Value Date/Time   NA 134 (L) 10/10/2022 0621   NA 137 11/29/2012 0902   K 4.0 10/10/2022 0621   K 3.8 11/29/2012 0902   CL 100 10/10/2022 0621   CL 104 11/29/2012 0902   CO2 26 10/10/2022 0621   CO2 28 11/29/2012 0902   GLUCOSE 181 (H) 10/10/2022 0621   GLUCOSE 131 (H) 11/29/2012 0902   BUN 20 10/10/2022 0621   BUN 19 (H) 11/29/2012 0902   CREATININE 0.99 10/10/2022 0621   CREATININE 0.85 11/29/2012 0902   CALCIUM 8.9 10/10/2022 0621   CALCIUM 8.5 11/29/2012 0902   GFRNONAA >60 10/10/2022 0621   GFRNONAA >60 11/29/2012 0902   GFRAA >60 08/25/2019 0737   GFRAA >60 11/29/2012 0902   Micro with MRSA.  Susceptible to tetracyclines Assessment/Planning: Osteomyelitis left great toe status post great toe amputation MRSA infection Diabetes with neuropathy   Incision site is coapted nicely.  New bandages applied today.  I discussed with the family and the patient today the need for 2-3 times a week dressing changes.  The wife feels very comfortable doing this and has performed this in the past. Recommended minimal weightbearing heel only.  Otherwise should stay off the foot. MRSA infection with susceptibility to tetracycline.  Recommend p.o. doxycycline upon discharge. From  podiatry standpoint patient is stable for discharge.  Should follow-up in my office next week.   Gwyneth Revels A  10/11/2022, 5:18 PM

## 2022-10-11 NOTE — Interval H&P Note (Signed)
History and Physical Interval Note:  10/11/2022 1:39 PM  Jared Castillo  has presented today for surgery, with the diagnosis of PAD.  The various methods of treatment have been discussed with the patient and family. After consideration of risks, benefits and other options for treatment, the patient has consented to  Procedure(s): Lower Extremity Angiography (Left) as a surgical intervention.  The patient's history has been reviewed, patient examined, no change in status, stable for surgery.  I have reviewed the patient's chart and labs.  Questions were answered to the patient's satisfaction.     Festus Barren

## 2022-10-11 NOTE — Inpatient Diabetes Management (Signed)
Inpatient Diabetes Program Recommendations  AACE/ADA: New Consensus Statement on Inpatient Glycemic Control   Target Ranges:  Prepandial:   less than 140 mg/dL      Peak postprandial:   less than 180 mg/dL (1-2 hours)      Critically ill patients:  140 - 180 mg/dL    Latest Reference Range & Units 10/10/22 08:52 10/10/22 11:38 10/10/22 17:01 10/10/22 21:12 10/11/22 07:38  Glucose-Capillary 70 - 99 mg/dL 161 (H) 096 (H) 045 (H) 286 (H) 210 (H)   Review of Glycemic Control  Diabetes history: DM2 Outpatient Diabetes medications: Levemir 38 units BID, Humalog 0-10 units TID with meals, Metformin 1000 mg BID Current orders for Inpatient glycemic control: Levemir 20 units BID, Novolog 4 units TID with meals, Novolog 0-15 units TID with meals, Novolog 0-5 units at bedtime  Inpatient Diabetes Program Recommendations:    Insulin: Please consider increasing Levemir to 23 units BID and meal coverage to Novolog 6 units TID with meals.  Thanks, Orlando Penner, RN, MSN, CDCES Diabetes Coordinator Inpatient Diabetes Program [phone] (Team Pager from 8am to 5pm)

## 2022-10-11 NOTE — Progress Notes (Signed)
Progress Note   Patient: Jared Castillo ZOX:[id number] DOB: 1951/02/09 DOA: 10/07/2022     4 DOS: the patient was seen and examined on 10/11/2022   Brief hospital course: 72 y.o. 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  Patient started having redness, swelling, skin breakdown and increased pain of the foot prompting him to see his podiatrist.  He denies fever or chills. ED course and data review: Vitals within normal limits.  Labs with WBC 10,700 and lactic acid 2.1.  Sed rate 74.  Glucose 432 with sodium 128. X-ray showed soft tissue swelling of the first ray without erosive changes. Patient started on Zosyn and vancomycin, given Dilaudid for pain. Hospitalist consulted for admission.  7/6.  Continue IV antibiotics.  MRI showing early acute osteomyelitis of the proximal phalanx of the left great toe and 2.7 x 0.5 x 2.6 cm of developing abscess.  ABI done but still pending 7/7.  Podiatry took to the operating room for amputation of left great toe and full-thickness excisional debridement of ulcer.  ABI still pending. 7/8.  Vascular surgery consultation for the way the radiology team read the ABI.  Vascular team to take to angiogram tomorrow.  Patient complaining of foot pain. 7/9.  Angiogram today did not show any blockages.  Assessment and Plan: Osteomyelitis (HCC) Acute osteomyelitis first left great toe.  Amputation of the first great toe 7/7 by podiatry.  Continue IV antibiotics Rocephin and vancomycin.  Still awaiting pathology from surgery.  Cellulitis and abscess of toe Went to OR 7/7 for debridement of abscess.  Continue IV antibiotics.  Diabetic foot ulcer  (HCC) Had debridement of diabetic foot ulcer in the operating room on 7/7.  PAD (peripheral artery disease) (HCC) Angiogram today was negative.  PAD ruled out.  Uncontrolled type 2 diabetes mellitus with hyperglycemia, with long-term current use of insulin (HCC) Diabetic neuropathy Pseudohyponatremia Hemoglobin A1c elevated at 12.4.  Patient currently on Semglee insulin 20 units twice a day and sliding scale.  Continue NovoLog prior to meals also.  Essential hypertension Continue metoprolol and lisinopril without HCTZ (with low sodium)  Lactic acidosis Hold metformin.  Hyponatremia Potentially pseudohyponatremia with elevated sugars versus hyponatremia secondary to hydrochlorothiazide.  Last sodium 134  Hypokalemia Replaced        Subjective: Patient seen this morning prior to procedure.  Patient feeling okay.  Some pain in his foot.  Physical Exam: Vitals:   10/11/22 1428 10/11/22 1430 10/11/22 1445 10/11/22 1500  BP: 132/74 123/66 131/76 113/72  Pulse: (!) 0 64 64 (!) 59  Resp:  14 11 15   Temp:      TempSrc:      SpO2:  93% 92% 93%  Weight:      Height:       Physical Exam HENT:     Head: Normocephalic.     Mouth/Throat:     Pharynx: No oropharyngeal exudate.  Eyes:     General: Lids are normal.     Conjunctiva/sclera: Conjunctivae normal.  Cardiovascular:     Rate and Rhythm: Normal rate and regular rhythm.     Heart sounds: Normal heart sounds, S1 normal and S2 normal.  Pulmonary:     Breath  sounds: No decreased breath sounds, wheezing, rhonchi or rales.  Abdominal:     Palpations: Abdomen is soft.     Tenderness: There is no abdominal tenderness.  Musculoskeletal:     Right lower leg: No swelling.     Left lower leg: No swelling.  Skin:    General: Skin is warm.     Comments: Left foot wrapped.  Neurological:     Mental Status: He is alert and oriented to person, place, and time.     Data Reviewed: Angiogram today was negative  Family  Communication: Spoke with wife on the phone  Disposition: Status is: Inpatient Remains inpatient appropriate because: Angiogram today.  Potentially home soon.  Still awaiting bone pathology from the OR results.  Planned Discharge Destination: Home    Time spent: 28 minutes  Author: Alford Highland, MD 10/11/2022 3:11 PM  For on call review [email or web address].

## 2022-10-11 NOTE — Plan of Care (Signed)

## 2022-10-11 NOTE — Op Note (Signed)
[facility]  Percutaneous Study/Intervention Procedural Note   Date of Surgery: 10/11/2022  Surgeon(s):Lunden Stieber    Assistants:none  Pre-operative Diagnosis: PAD with ulceration and infection left foot  Post-operative diagnosis:  Same  Procedure(s) Performed:             1.  Ultrasound guidance for vascular access right femoral artery             2.  Catheter placement into left SFA from right femoral approach             3.  Aortogram and selective left lower extremity angiogram             4.  StarClose closure device right femoral artery  EBL: 5 cc  Contrast: 45 cc  Fluoro Time: 1 minute  Moderate Conscious Sedation Time: approximately 20 minutes using 2 mg of Versed and 50 mcg of Fentanyl              Indications:  Patient is a 72 y.o.male with ulceration and infection of the left foot status post digital amputation. The patient has noninvasive study showing noncompressible ABIs. The patient is brought in for angiography for further evaluation and potential treatment.  Due to the limb threatening nature of the situation, angiogram was performed for attempted limb salvage. The patient is aware that if the procedure fails, amputation would be expected.  The patient also understands that even with successful revascularization, amputation may still be required due to the severity of the situation.  Risks and benefits are discussed and informed consent is obtained.   Procedure:  The patient was identified and appropriate procedural time out was performed.  The patient was then placed supine on the table and prepped and draped in the usual sterile fashion. Moderate conscious sedation was administered during a face to face encounter with the patient throughout the procedure with my supervision of the RN administering medicines and monitoring the patient's vital signs, pulse oximetry, telemetry and mental status throughout from the start of the procedure until the  patient was taken to the recovery room. Ultrasound was used to evaluate the right common femoral artery.  It was patent .  A digital ultrasound image was acquired.  A Seldinger needle was used to access the right common femoral artery under direct ultrasound guidance and a permanent image was performed.  A 0.035 J wire was advanced without resistance and a 5Fr sheath was placed.  Pigtail catheter was placed into the aorta and an AP aortogram was performed. This demonstrated normal renal arteries and normal aorta and iliac segments without significant stenosis. I then crossed the aortic bifurcation and advanced to the left femoral head.  After the initial image, the pigtail catheter was advanced into the proximal to mid left SFA to help opacify the tibial vessels.  Selective left lower extremity angiogram was then performed. This demonstrated normal common femoral artery, profunda femoris artery, and minimally diseased SFA.  The popliteal artery was widely patent.  There was a typical tibial trifurcation without any focal stenosis in the tibial vessels and two-vessel runoff into the foot with both the posterior tibial and anterior tibial arteries.  The patient has adequate blood flow for wound healing and there is no role for endovascular intervention.  I elected to terminate the procedure. The sheath was removed and StarClose closure device was deployed in the right femoral artery with excellent hemostatic result. The patient was taken to the recovery room in stable condition having  tolerated the procedure well.  Findings:               Aortogram:  This demonstrated normal renal arteries and normal aorta and iliac segments without significant stenosis             Left Lower Extremity:  This demonstrated normal common femoral artery, profunda femoris artery, and minimally diseased SFA.  The popliteal artery was widely patent.  There was a typical tibial trifurcation without any focal stenosis in the tibial  vessels and two-vessel runoff into the foot with both the posterior tibial and anterior tibial arteries   Disposition: Patient was taken to the recovery room in stable condition having tolerated the procedure well.  Complications: None  Festus Barren 10/11/2022 2:27 PM   This note was created with Dragon Medical transcription system. Any errors in dictation are purely unintentional.

## 2022-10-11 NOTE — Progress Notes (Signed)
Pharmacy Antibiotic Note  Jared Castillo is a 72 y.o. male admitted on 10/07/2022 with cellulitis.  Pharmacy has been consulted for Vanc, Cefepime dosing.  Plan: Continue cefepime 2 grams IV every 8 hours  Obtained Vancomycin trough 7/8 @ 2054=14, target trough 10-15 mcg/ml Continue vancomycin 1 gram IV every 12 hours Will recheck trough level again when clinically indicated     Height: 6\' 1"  (185.4 cm) Weight: 83.9 kg (185 lb) IBW/kg (Calculated) : 79.9  Temp (24hrs), Avg:98.2 F (36.8 C), Min:97.5 F (36.4 C), Max:99.4 F (37.4 C)  Recent Labs  Lab 10/07/22 1547 10/07/22 1942 10/08/22 0006 10/08/22 0440 10/08/22 0443 10/10/22 0621 10/10/22 2054  WBC 10.7*  --   --   --  8.4  --   --   CREATININE 1.20  --   --  1.03  --  0.99  --   LATICACIDVEN  --  2.1* 1.5  --   --   --   --   VANCOTROUGH  --   --   --   --   --   --  14*     Estimated Creatinine Clearance: 77.3 mL/min (by C-G formula based on SCr of 0.99 mg/dL).    Allergies  Allergen Reactions   Trazodone Other (See Comments)    Nightmares    Antimicrobials this admission: Vancomycin 7/5 >>  Cefepime 7/6 >>   Dose adjustments this admission: N/A  Microbiology results: 7/5 BCx: ngtd 7/6 Abscess: pending 7/7 Surgical culture: pending 7/6 MRSA PCR: positive  Thank you for allowing pharmacy to be a part of this patient's care.  Bettey Costa, PharmD 10/11/2022 7:28 AM

## 2022-10-12 ENCOUNTER — Encounter: Payer: Self-pay | Admitting: Vascular Surgery

## 2022-10-12 DIAGNOSIS — M86172 Other acute osteomyelitis, left ankle and foot: Secondary | ICD-10-CM | POA: Diagnosis not present

## 2022-10-12 DIAGNOSIS — L03032 Cellulitis of left toe: Secondary | ICD-10-CM | POA: Diagnosis not present

## 2022-10-12 DIAGNOSIS — L97528 Non-pressure chronic ulcer of other part of left foot with other specified severity: Secondary | ICD-10-CM

## 2022-10-12 DIAGNOSIS — E11621 Type 2 diabetes mellitus with foot ulcer: Secondary | ICD-10-CM | POA: Diagnosis not present

## 2022-10-12 DIAGNOSIS — E1165 Type 2 diabetes mellitus with hyperglycemia: Secondary | ICD-10-CM

## 2022-10-12 DIAGNOSIS — I10 Essential (primary) hypertension: Secondary | ICD-10-CM | POA: Diagnosis not present

## 2022-10-12 LAB — BASIC METABOLIC PANEL
Anion gap: 8 (ref 5–15)
BUN: 13 mg/dL (ref 8–23)
CO2: 25 mmol/L (ref 22–32)
Calcium: 8.9 mg/dL (ref 8.9–10.3)
Chloride: 103 mmol/L (ref 98–111)
Creatinine, Ser: 0.72 mg/dL (ref 0.61–1.24)
GFR, Estimated: >60 mL/min (ref >60)
Glucose, Bld: 200 mg/dL — ABNORMAL HIGH (ref 70–99)
Potassium: 3.7 mmol/L (ref 3.5–5.1)
Sodium: 136 mmol/L (ref 135–145)

## 2022-10-12 LAB — CBC
HCT: 35 % — ABNORMAL LOW (ref 39.0–52.0)
Hemoglobin: 12 g/dL — ABNORMAL LOW (ref 13.0–17.0)
MCH: 29.3 pg (ref 26.0–34.0)
MCHC: 34.3 g/dL (ref 30.0–36.0)
MCV: 85.4 fL (ref 80.0–100.0)
Platelets: 281 10*3/uL (ref 150–400)
RBC: 4.1 MIL/uL — ABNORMAL LOW (ref 4.22–5.81)
RDW: 12.2 % (ref 11.5–15.5)
WBC: 5 10*3/uL (ref 4.0–10.5)
nRBC: 0 % (ref 0.0–0.2)

## 2022-10-12 LAB — GLUCOSE, CAPILLARY
Glucose-Capillary: 145 mg/dL — ABNORMAL HIGH (ref 70–99)
Glucose-Capillary: 291 mg/dL — ABNORMAL HIGH (ref 70–99)

## 2022-10-12 MED ORDER — DOXYCYCLINE HYCLATE 100 MG PO TABS
100.0000 mg | ORAL_TABLET | Freq: Two times a day (BID) | ORAL | Status: DC
  Administered 2022-10-12: 100 mg via ORAL
  Filled 2022-10-12: qty 1

## 2022-10-12 MED ORDER — AMOXICILLIN 500 MG PO CAPS
500.0000 mg | ORAL_CAPSULE | Freq: Three times a day (TID) | ORAL | Status: DC
  Filled 2022-10-12: qty 1

## 2022-10-12 MED ORDER — AMOXICILLIN 500 MG PO CAPS: 500.0000 mg | ORAL_CAPSULE | Freq: Three times a day (TID) | ORAL | 0 refills | Status: AC

## 2022-10-12 MED ORDER — INSULIN DETEMIR 100 UNIT/ML ~~LOC~~ SOLN: 23.0000 [IU] | Freq: Two times a day (BID) | SUBCUTANEOUS | 11 refills | Status: AC

## 2022-10-12 MED ORDER — HYDROCODONE-ACETAMINOPHEN 5-325 MG PO TABS: 1.0000 | ORAL_TABLET | Freq: Four times a day (QID) | ORAL | 0 refills | Status: DC | PRN

## 2022-10-12 MED ORDER — DOXYCYCLINE HYCLATE 100 MG PO TABS: 100.0000 mg | ORAL_TABLET | Freq: Two times a day (BID) | ORAL | 0 refills | Status: AC

## 2022-10-12 MED ORDER — MUPIROCIN 2 % EX OINT: 1.0000 | TOPICAL_OINTMENT | Freq: Two times a day (BID) | CUTANEOUS | 0 refills | Status: DC

## 2022-10-12 NOTE — Evaluation (Addendum)
Physical Therapy Evaluation Patient Details Name: Jared Castillo MRN: [id number] DOB: [birth year] Today's Date: 10/12/2022  History of Present Illness  Pt is a 72 y/o M admitted on 10/07/22 after being sent in by podiatry for cellulitis & possible osteomyelitis of L great toe 2/2 work related injury. Pt underwent 1st great L toe amputation on 7/7, LLE angiography on 7/9. 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 Pt reports prior to admission he was working, ambulatory with PRN use of SPC, living with wife in mobile home with ramped entrance. On this date pt is able to don L post op shoe without assistance & complete bed mobility, STS & step pivot with RW & mod I. PT educates pt on need to wear post op shoe for mobility, minimize walking. Educated pt to use w/c as much as possible in the home (main area of home where w/c can fit). Pt verbalized understanding of all education & is eager for d/c today.        Assistance Recommended at Discharge PRN  If plan is discharge home, recommend the following:  Can travel by private vehicle  Assistance with cooking/housework;Assist for transportation;Help with stairs or ramp for entrance        Equipment Recommendations BSC/3in1;Rolling walker (2 wheels) (tall as pt is 6'1")  Recommendations for Other Services       Functional Status Assessment Patient has had a recent decline in their functional status and demonstrates the ability to make significant improvements in function in a reasonable and predictable amount of time.     Precautions / Restrictions Precautions Precautions: Fall Restrictions Weight Bearing Restrictions: Yes LLE Weight Bearing: Partial weight bearing LLE Partial Weight Bearing Percentage or Pounds: heel weight bearing in post op shoe Other Position/Activity Restrictions: minimize gait as much as  possible      Mobility  Bed Mobility Overal bed mobility: Modified Independent             General bed mobility comments: supine>sit    Transfers Overall transfer level: Modified independent Equipment used: Rolling walker (2 wheels) Transfers: Sit to/from Stand, Bed to chair/wheelchair/BSC Sit to Stand: Modified independent (Device/Increase time)   Step pivot transfers: Modified independent (Device/Increase time)       General transfer comment: STS from EOB    Ambulation/Gait                  Stairs            Wheelchair Mobility     Tilt Bed    Modified Rankin (Stroke Patients Only)       Balance Overall balance assessment: Mild deficits observed, not formally tested   Sitting balance-Leahy Scale: Good     Standing balance support: Bilateral upper extremity supported, During functional activity Standing balance-Leahy Scale: Good                               Pertinent Vitals/Pain Pain Assessment Pain Assessment: Faces Faces Pain Scale: No hurt    Home Living Family/patient expects to be discharged to:: Private residence Living Arrangements: Spouse/significant other Available Help at Discharge: Family Type of Home: Mobile home Home Access: Ramped entrance       Home Layout: One level Home Equipment: Wheelchair - manual      Prior Function Prior Level of Function :  Independent/Modified Independent;Working/employed             Mobility Comments: indep with SPC PRN, 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[place], night shift)       Hand Dominance        Extremity/Trunk Assessment   Upper Extremity Assessment Upper Extremity Assessment: Overall WFL for tasks assessed    Lower Extremity Assessment Lower Extremity Assessment: Overall WFL for tasks assessed;LLE deficits/detail RLE Deficits / Details: hx of R digit amputation LLE Deficits / Details: foot wrapped in ace wrap       Communication   Communication: No  difficulties  Cognition Arousal/Alertness: Awake/alert Behavior During Therapy: WFL for tasks assessed/performed Overall Cognitive Status: Within Functional Limits for tasks assessed                                 General Comments: very pleasant, appreciative of PT session        General Comments General comments (skin integrity, edema, etc.): Pt able to don L post op shoe & R sock without assistance or LOB sitting EOB.    Exercises     Assessment/Plan    PT Assessment Patient needs continued PT services  PT Problem List Decreased strength;Decreased mobility;Decreased knowledge of use of DME       PT Treatment Interventions DME instruction;Therapeutic exercise;Balance training;Neuromuscular re-education;Functional mobility training;Therapeutic activities;Patient/family education;Gait training;Wheelchair mobility training;Manual techniques    PT Goals (Current goals can be found in the Care Plan section)  Acute Rehab PT Goals Patient Stated Goal: go home PT Goal Formulation: With patient Time For Goal Achievement: 10/26/22 Potential to Achieve Goals: Good Additional Goals Additional Goal #1: Pt will propel w/c 150 ft with mod I to increase independence with community mobility.    Frequency Min 1X/week     Co-evaluation               AM-PAC PT "6 Clicks" Mobility  Outcome Measure Help needed turning from your back to your side while in a flat bed without using bedrails?: None Help needed moving from lying on your back to sitting on the side of a flat bed without using bedrails?: None Help needed moving to and from a bed to a chair (including a wheelchair)?: None Help needed standing up from a chair using your arms (e.g., wheelchair or bedside chair)?: None Help needed to walk in hospital room?: A Little Help needed climbing 3-5 steps with a railing? : A Little 6 Click Score: 22    End of Session Equipment Utilized During Treatment:  (LLE post op  shoe) Activity Tolerance: Patient tolerated treatment well Patient left: in chair;with chair alarm set;with call bell/phone within reach   PT Visit Diagnosis: Muscle weakness (generalized) (M62.81);Other abnormalities of gait and mobility (R26.89)    Time: 1610-9604 PT Time Calculation (min) (ACUTE ONLY): 13 min   Charges:   PT Evaluation $PT Eval Low Complexity: 1 Low   PT General Charges $$ ACUTE PT VISIT: 1 Visit         Aleda Grana, PT, DPT 10/12/22, 9:59 AM   Sandi Mariscal 10/12/2022, 9:58 AM

## 2022-10-12 NOTE — Inpatient Diabetes Management (Signed)
Inpatient Diabetes Program Recommendations  AACE/ADA: New Consensus Statement on Inpatient Glycemic Control   Target Ranges:  Prepandial:   less than 140 mg/dL      Peak postprandial:   less than 180 mg/dL (1-2 hours)      Critically ill patients:  140 - 180 mg/dL    Latest Reference Range & Units 10/12/22 04:41  Glucose 70 - 99 mg/dL 161 (H)    Latest Reference Range & Units 10/11/22 07:38 10/11/22 11:09 10/11/22 13:38 10/11/22 14:37 10/11/22 15:30 10/11/22 17:31 10/11/22 21:28  Glucose-Capillary 70 - 99 mg/dL 096 (H)  Novolog 9 units  Levemir 20 units @9 :38 134 (H)  Novolog 6 units 67 (L) 144 (H) 125 (H)   Novolog 6 units 339 (H)  Novolog 4 units  Levemir 20 units   Review of Glycemic Control  Diabetes history: DM2 Outpatient Diabetes medications: Levemir 38 units BID, Humalog 0-10 units TID with meals, Metformin 1000 mg BID Current orders for Inpatient glycemic control: Levemir 20 units BID, Novolog 4 units TID with meals, Novolog 0-15 units TID with meals, Novolog 0-5 units at bedtime   Inpatient Diabetes Program Recommendations:     Insulin: Patient received Novolog meal coverage for breakfast and lunch on 10/11/22 and was NPO. Noted CBG down to 67 at 13:38; which was likely from getting meal coverage insulin and not eating. If CBGs are consistently over 180 mg/dl,please consider increasing Levemir to 23 units BID and meal coverage to Novolog 6 units TID with meals.    Thanks, Orlando Penner, RN, MSN, CDCES Diabetes Coordinator Inpatient Diabetes Program [phone] (Team Pager from 8am to 5pm)

## 2022-10-12 NOTE — Plan of Care (Signed)
  Problem: Clinical Measurements: Goal: Ability to avoid or minimize complications of infection will improve Outcome: Progressing  Problem: Health Behavior/Discharge Planning: Goal: Ability to manage health-related needs will improve Outcome: Progressing   Problem: Clinical Measurements: Goal: Ability to maintain clinical measurements within normal limits will improve Outcome: Progressing Goal: Will remain free from infection Outcome: Progressing Goal: Diagnostic test results will improve Outcome: Progressing Goal: Respiratory complications will improve Outcome: Progressing Goal: Cardiovascular complication will be avoided Outcome: Progressing   Problem: Activity: Goal: Risk for activity intolerance will decrease Outcome: Progressing   Problem: Nutrition: Goal: Adequate nutrition will be maintained Outcome: Progressing   Problem: Coping: Goal: Level of anxiety will decrease Outcome: Progressing   Problem: Elimination: Goal: Will not experience complications related to bowel motility Outcome: Progressing Goal: Will not experience complications related to urinary retention Outcome: Progressing   Problem: Pain Managment: Goal: General experience of comfort will improve Outcome: Progressing   Problem: Safety: Goal: Ability to remain free from injury will improve Outcome: Progressing   Problem: Safety: Goal: Ability to remain free from injury will improve Outcome: Progressing   Problem: Skin Integrity: Goal: Risk for impaired skin integrity will decrease Outcome: Progressing   Problem: Skin Integrity: Goal: Skin integrity will improve Outcome: Progressing   Problem: Education: Goal: Knowledge of General Education information will improve Description: Including pain rating scale, medication(s)/side effects and non-pharmacologic comfort measures Outcome: Progressing

## 2022-10-12 NOTE — Discharge Summary (Signed)
Physician Discharge Summary   Patient: Jared Castillo MRN: [id number] DOB: 04/11/1950  Admit date:     10/07/2022  Discharge date: 10/12/22  Discharge Physician: Marcelino Duster   PCP: Verner Mould, MD   Recommendations at discharge:   PCP follow up in 1 week. Podiatry follow up as suggested.  Discharge Diagnoses: Active Problems:   Osteomyelitis (HCC)   Cellulitis and abscess of toe   Diabetic foot ulcer (HCC)   PAD (peripheral artery disease) (HCC)   Uncontrolled type 2 diabetes mellitus with hyperglycemia, with long-term current use of insulin (HCC)   Essential hypertension   Lactic acidosis   Hyponatremia   Hypokalemia   Diabetic neuropathy (HCC)  Resolved Problems:   * No resolved hospital problems. *  Hospital Course: 72 y.o. 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  Patient started having redness, swelling, skin breakdown and increased pain of the foot prompting him to see his podiatrist.  He denies fever or chills. ED course and data review: Vitals within normal limits.  Labs with WBC 10,700 and lactic acid 2.1.  Sed rate 74.  Glucose 432 with sodium 128. X-ray showed soft tissue swelling of the first ray without erosive changes. Patient started on Zosyn and vancomycin, given Dilaudid for pain. Hospitalist consulted for admission.  7/6.  Continue IV antibiotics.  MRI showing early acute osteomyelitis of the proximal phalanx of the left great toe and 2.7 x 0.5 x 2.6 cm of developing abscess.  ABI done but still pending 7/7.  Podiatry took to the operating room for amputation of left great toe and full-thickness excisional debridement of  ulcer.  ABI still pending. 7/8.  Vascular surgery consultation for the way the radiology team read the ABI.  Vascular team to take to angiogram tomorrow.  Patient complaining of foot pain. 7/9.  Angiogram today did not show any blockages. 7/10 Patient is stable for discharge on oral antibiotics, path report showed margins clear of osteomyelitis, he is advised to follow up with PCP, Podiatry upon discharge as instructed.   Assessment and Plan: Osteomyelitis (HCC) Acute osteomyelitis first left great toe.  Amputation of the first great toe 7/7 by podiatry.  Continue IV antibiotics Rocephin and vancomycin.  Pathology negative for osteo on margins. Antibiotics changed to Amoxicillin, doxy for 5 more days. Opiate pain meds sent to pharmacy. Advised to follow up with podiatry in a week.  Cellulitis and abscess of toe Went to OR 7/7 for debridement of abscess.  Cultures reviewed. Continue oral antibiotics amox and doxy  Diabetic foot ulcer (HCC) Had debridement of diabetic foot ulcer in the operating room on 7/7.  PAD (peripheral artery disease) (HCC) Angiogram was negative.  PAD ruled out.  Uncontrolled type 2 diabetes mellitus with hyperglycemia, with long-term current use of insulin (HCC) Diabetic neuropathy Pseudohyponatremia Hemoglobin A1c elevated at 12.4.  adjusted long active insulin to 23 units twice a day and sliding scale.  Advised to keep  a log of blood sugars for PCP to adjust.  Essential hypertension Continue current home meds.  Lactic acidosis Resolved.  Hyponatremia Potentially pseudohyponatremia with elevated sugars versus hyponatremia secondary to hydrochlorothiazide.  Last sodium 136.  Hypokalemia Replaced  Advised to be  compliant with meds, PCP and podiatry follow up.       Consultants: Podiatry, Vascular Procedures performed: amputation of left great toe and full-thickness excisional debridement of ulcer, Angiogram.  Disposition: Home Diet recommendation:   Discharge Diet Orders (From admission, onward)     Start     Ordered   10/12/22 0000  Diet - low sodium heart healthy        10/12/22 1145           Carb modified diet DISCHARGE MEDICATION: Allergies as of 10/12/2022       Reactions   Trazodone Other (See Comments)   Nightmares        Medication List     STOP taking these medications    ibuprofen 200 MG tablet Commonly known as: ADVIL       TAKE these medications    albuterol 108 (90 Base) MCG/ACT inhaler Commonly known as: VENTOLIN HFA Inhale 1-2 puffs into the lungs every 6 (six) hours as needed for wheezing or shortness of breath.   amoxicillin 500 MG capsule Commonly known as: AMOXIL Take 1 capsule (500 mg total) by mouth every 8 (eight) hours for 5 days.   doxycycline 100 MG tablet Commonly known as: VIBRA-TABS Take 1 tablet (100 mg total) by mouth every 12 (twelve) hours for 5 days. What changed:  when to take this additional instructions   fluticasone 27.5 MCG/SPRAY nasal spray Commonly known as: VERAMYST Place 2 sprays into the nose daily.   gabapentin 300 MG capsule Commonly known as: NEURONTIN Take 600 mg by mouth 2 (two) times daily. 2 caps TID   HYDROcodone-acetaminophen 5-325 MG tablet Commonly known as: NORCO/VICODIN Take 1 tablet by mouth every 6 (six) hours as needed for moderate pain.   insulin detemir 100 UNIT/ML injection Commonly known as: LEVEMIR Inject 0.23 mLs (23 Units total) into the skin 2 (two) times daily. What changed: how much to take   insulin lispro 100 UNIT/ML injection Commonly known as: HUMALOG Inject 0-10 Units into the skin 3 (three) times daily as needed for high blood sugar. Sliding scale as needed for BG over 200.   lisinopril-hydrochlorothiazide 20-25 MG tablet Commonly known as: ZESTORETIC Take 0.5 tablets by mouth daily.   metFORMIN 1000 MG tablet Commonly known as: GLUCOPHAGE Take 1,000 mg by mouth 2 (two) times daily with a meal.   metoprolol  succinate 50 MG 24 hr tablet Commonly known as: TOPROL-XL Take 50 mg by mouth daily. Take with or immediately following a meal.   multivitamin with minerals Tabs tablet Take 1 tablet by mouth daily.   mupirocin ointment 2 % Commonly known as: BACTROBAN Place 1 Application into the nose 2 (two) times daily.   omeprazole 20 MG capsule Commonly known as: PRILOSEC Take 20 mg by mouth 2 (two) times daily.   rosuvastatin 40 MG tablet Commonly known as: CRESTOR Take 40 mg by mouth daily.   Saline Flush 0.9 % Soln As needed for dressing changes        Follow-up Information     Gwyneth Revels, DPM. Go on 10/20/2022.   Specialty: Podiatry Why: @ 10:30am. Contact information: [address] [place] [address] [phone]                Discharge Exam: Filed Weights   10/07/22 1546 10/11/22 1334  Weight: 83.9 kg 83.9 kg   General - Elderly Caucasian male, no apparent distress HEENT - PERRLA, EOMI, atraumatic head, non tender sinuses. Lung - Clear,  rales, rhonchi, wheezes. Heart - S1, S2 heard, no murmurs, rubs, no pedal edema Neuro - Alert, awake and oriented x 3, non focal exam. Skin - Warm and dry. Left foot dressing.  Condition at discharge: stable  The results of significant diagnostics from this hospitalization (including imaging, microbiology, ancillary and laboratory) are listed below for reference.   Imaging Studies: PERIPHERAL VASCULAR CATHETERIZATION  Result Date: 10/11/2022 See surgical note for result.  US ARTERIAL ABI (SCREENING LOWER EXTREMITY)  Result Date: 10/09/2022 CLINICAL DATA:  72 year old male with history of diabetic foot ulceration. EXAM: NONINVASIVE PHYSIOLOGIC VASCULAR STUDY OF BILATERAL LOWER EXTREMITIES TECHNIQUE: Evaluation of both lower extremities were performed at rest, including calculation of ankle-brachial indices with single level Doppler, pressure and pulse volume recording. COMPARISON:  02/15/2022 FINDINGS: Right ABI:   1.22, previously 1.34 Left ABI: 1.06 (noncompressible posterior tibial), previously noncompressible Right Lower Extremity:  Mixed arterial waveforms at the ankle. Left Lower Extremity:  Mixed arterial waveforms at the ankle. IMPRESSION: Technically normal ankle-brachial indices, however given history of diabetes, noncompressible left posterior tibial, previously noncompressible left ankle-brachial indices,, and mixed waveforms, today's normal findings are likely falsely elevated. Consider lower extremity arterial duplex versus CTA runoff for further characterization. Marliss Coots, MD [facility] Radiology Electronically Signed   By: Marliss Coots M.D.   On: 10/09/2022 15:09   MR FOOT LEFT WO CONTRAST  Result Date: 10/08/2022 CLINICAL DATA:  Foot swelling, diabetic, osteomyelitis suspected, xray done EXAM: MRI OF THE LEFT FOOT WITHOUT CONTRAST TECHNIQUE: Multiplanar, multisequence MR imaging of the left forefoot was performed. No intravenous contrast was administered. COMPARISON:  X-ray 10/07/2022 FINDINGS: Bones/Joint/Cartilage Mild bone marrow edema throughout the proximal phalanx of the left great toe. Possible subtle erosion along the medial base of the great toe proximal phalanx. Minimal subchondral marrow edema along the medial margin of the first metatarsal head. Mild marrow edema within the hallux sesamoids, more pronounced within the fibular hallux sesamoid. Trace first MTP joint effusion. Preserved marrow signal within the distal phalanx of the great toe. No additional sites of bone marrow edema. No additional erosions. No fracture or dislocation. Mild osteoarthritis of the first MTP joint. Ligaments Intact collateral ligaments. Muscles and Tendons Chronic denervation changes of the foot musculature. Trace tenosynovitis of the flexor hallucis longus tendon. Soft tissues Soft tissue wound or ulceration along the plantar surface of the foot underlying the  first metatarsal head. Soft tissue swelling and edema of the great toe. There are multiple foci of susceptibility within the soft tissues of the great toe suggestive of soft tissue air. Ill-defined fluid collection along the medial aspect of the great toe proximal phalanx measuring approximately 2.7 x 0.5 x 2.6 cm suggestive of phlegmon/developing abscess. IMPRESSION: 1. Mild bone marrow edema throughout the proximal phalanx of the left great toe with possible subtle erosion along the medial base of the great toe proximal phalanx. Findings are most compatible with early acute osteomyelitis. 2. Soft tissue wound or ulceration along the plantar surface of the foot underlying the first metatarsal head. Soft tissue swelling and edema of the great toe with suspected soft tissue air. Ill-defined fluid collection along the medial aspect of the great toe proximal phalanx measuring approximately 2.7 x 0.5 x 2.6 cm suggestive of phlegmon/developing abscess. 3. Minimal subchondral marrow edema along the medial margin of the first metatarsal head. Mild marrow edema within the hallux sesamoids, more pronounced within the fibular hallux sesamoid. Findings are nonspecific and may be reactive or infectious in  etiology. 4. Trace tenosynovitis of the flexor hallucis longus tendon. Electronically Signed   By: Duanne Guess D.O.   On: 10/08/2022 17:42   DG Foot Complete Left  Result Date: 10/07/2022 CLINICAL DATA:  Infection. EXAM: LEFT FOOT - COMPLETE 3 VIEW COMPARISON:  None Available. FINDINGS: No fracture or dislocation. Slight hypertrophic changes of the first metatarsophalangeal joint. Preserved bone mineralization. No definite erosive changes. Vascular calcifications are seen. Soft tissue swelling about the first ray. The AP view is limited by motion. Overall if there is further concern of infection including bone infection recommend additional evaluation for a penetrating ulcer down to bone as well as possible MRI or bone  scan for further sensitivity. IMPRESSION: Soft tissue swelling of the first ray without erosive changes. Motion on the AP view. Electronically Signed   By: Karen Kays M.D.   On: 10/07/2022 16:12    Microbiology: Results for orders placed or performed during the hospital encounter of 10/07/22  Culture, blood (routine x 2)     Status: None (Preliminary result)   Collection Time: 10/07/22  7:42 PM   Specimen: BLOOD  Result Value Ref Range Status   Specimen Description BLOOD RIGHT ANTECUBITAL  Final   Special Requests   Final    BOTTLES DRAWN AEROBIC AND ANAEROBIC Blood Culture results may not be optimal due to an excessive volume of blood received in culture bottles   Culture   Final    NO GROWTH 4 DAYS Performed at [facility], [address] [place], [place] [address]    Report Status PENDING  Incomplete  Culture, blood (routine x 2)     Status: None (Preliminary result)   Collection Time: 10/07/22  8:15 PM   Specimen: BLOOD RIGHT ARM  Result Value Ref Range Status   Specimen Description BLOOD RIGHT ARM  Final   Special Requests   Final    BOTTLES DRAWN AEROBIC AND ANAEROBIC Blood Culture results may not be optimal due to an excessive volume of blood received in culture bottles   Culture   Final    NO GROWTH 4 DAYS Performed at [facility], [address] [place], [place] [address]    Report Status PENDING  Incomplete  Aerobic/Anaerobic Culture w Gram Stain (surgical/deep wound)     Status: None (Preliminary result)   Collection Time: 10/08/22 11:53 AM   Specimen: Foot; Abscess  Result Value Ref Range Status   Specimen Description   Final    FOOT Performed at [facility], [address] [place], [place] [address]    Special Requests   Final    LEFT Performed at [facility], [address] [place], [place] [address]    Gram Stain   Final    RARE SQUAMOUS EPITHELIAL CELLS PRESENT RARE WBC PRESENT, PREDOMINANTLY PMN MODERATE  GRAM POSITIVE COCCI Performed at [facility], [address], [place], [place] [address]    Culture   Final    ABUNDANT METHICILLIN RESISTANT STAPHYLOCOCCUS AUREUS ABUNDANT GROUP B STREP(S.AGALACTIAE)ISOLATED TESTING AGAINST S. AGALACTIAE NOT ROUTINELY PERFORMED DUE TO PREDICTABILITY OF AMP/PEN/VAN SUSCEPTIBILITY. NO ANAEROBES ISOLATED; CULTURE IN PROGRESS FOR 5 DAYS    Report Status PENDING  Incomplete   Organism ID, Bacteria METHICILLIN RESISTANT STAPHYLOCOCCUS AUREUS  Final      Susceptibility   Methicillin resistant staphylococcus aureus - MIC*    CIPROFLOXACIN >=8 RESISTANT Resistant     ERYTHROMYCIN >=8 RESISTANT Resistant     GENTAMICIN <=0.5 SENSITIVE Sensitive  OXACILLIN >=4 RESISTANT Resistant     TETRACYCLINE <=1 SENSITIVE Sensitive     VANCOMYCIN <=0.5 SENSITIVE Sensitive     TRIMETH/SULFA 160 RESISTANT Resistant     CLINDAMYCIN >=8 RESISTANT Resistant     RIFAMPIN <=0.5 SENSITIVE Sensitive     Inducible Clindamycin NEGATIVE Sensitive     LINEZOLID 1 SENSITIVE Sensitive     * ABUNDANT METHICILLIN RESISTANT STAPHYLOCOCCUS AUREUS  Surgical PCR screen     Status: Abnormal   Collection Time: 10/08/22  3:36 PM   Specimen: Nasal Mucosa; Nasal Swab  Result Value Ref Range Status   MRSA, PCR POSITIVE (A) NEGATIVE Final    Comment: RESULT CALLED TO, READ BACK BY AND VERIFIED WITH:  AJIBADE ADEYEMI J @1821  10/08/2022 CP    Staphylococcus aureus POSITIVE (A) NEGATIVE Final    Comment: RESULT CALLED TO, READ BACK BY AND VERIFIED WITH:  AJIBADE ADEYEMI J @1821  10/08/2022 CP (NOTE) The Xpert SA Assay (FDA approved for NASAL specimens in patients 18 years of age and older), is one component of a comprehensive surveillance program. It is not intended to diagnose infection nor to guide or monitor treatment. Performed at [facility], [address], [place], [place] [address]   Aerobic/Anaerobic Culture w Gram Stain (surgical/deep wound)     Status: None  (Preliminary result)   Collection Time: 10/09/22  8:36 AM   Specimen: Path Tissue  Result Value Ref Range Status   Specimen Description ABSCESS  Final   Special Requests LEFT FOOT  Final   Gram Stain   Final    RARE WBC SEEN RARE GRAM POSITIVE COCCI Performed at [facility], [address], [place], [place] [address]    Culture   Final    FEW METHICILLIN RESISTANT STAPHYLOCOCCUS AUREUS FEW GROUP B STREP(S.AGALACTIAE)ISOLATED TESTING AGAINST S. AGALACTIAE NOT ROUTINELY PERFORMED DUE TO PREDICTABILITY OF AMP/PEN/VAN SUSCEPTIBILITY. NO ANAEROBES ISOLATED; CULTURE IN PROGRESS FOR 5 DAYS    Report Status PENDING  Incomplete   Organism ID, Bacteria METHICILLIN RESISTANT STAPHYLOCOCCUS AUREUS  Final      Susceptibility   Methicillin resistant staphylococcus aureus - MIC*    CIPROFLOXACIN >=8 RESISTANT Resistant     ERYTHROMYCIN >=8 RESISTANT Resistant     GENTAMICIN <=0.5 SENSITIVE Sensitive     OXACILLIN >=4 RESISTANT Resistant     TETRACYCLINE <=1 SENSITIVE Sensitive     VANCOMYCIN <=0.5 SENSITIVE Sensitive     TRIMETH/SULFA >=320 RESISTANT Resistant     CLINDAMYCIN >=8 RESISTANT Resistant     RIFAMPIN <=0.5 SENSITIVE Sensitive     Inducible Clindamycin NEGATIVE Sensitive     LINEZOLID 2 SENSITIVE Sensitive     * FEW METHICILLIN RESISTANT STAPHYLOCOCCUS AUREUS    Labs: CBC: Recent Labs  Lab 10/07/22 1547 10/08/22 0443 10/10/22 0621 10/12/22 0441  WBC 10.7* 8.4  --  5.0  HGB 13.4 11.0* 11.8* 12.0*  HCT 38.4* 31.1*  --  35.0*  MCV 85.3 84.5  --  85.4  PLT 345 259  --  281   Basic Metabolic Panel: Recent Labs  Lab 10/07/22 1547 10/08/22 0440 10/10/22 0621 10/12/22 0441  NA 128* 131* 134* 136  K 4.1 3.2* 4.0 3.7  CL 90* 94* 100 103  CO2 26 28 26 25   GLUCOSE 432* 252* 181* 200*  BUN 22 17 20 13   CREATININE 1.20 1.03 0.99 0.72  CALCIUM 9.2 8.8* 8.9 8.9  MG  --   --  1.7  --    Liver Function Tests: No  results for input(s): "AST", "ALT", "ALKPHOS",  "BILITOT", "PROT", "ALBUMIN" in the last 168 hours. CBG: Recent Labs  Lab 10/11/22 1437 10/11/22 1530 10/11/22 2128 10/12/22 0752 10/12/22 1140  GLUCAP 144* 125* 339* 145* 291*    Discharge time spent: greater than 30 minutes.  Signed: Marcelino Duster, MD [facility] 10/12/2022

## 2022-10-14 LAB — AEROBIC/ANAEROBIC CULTURE W GRAM STAIN (SURGICAL/DEEP WOUND)

## 2022-12-09 ENCOUNTER — Encounter (HOSPITAL_COMMUNITY): Payer: Self-pay

## 2022-12-09 ENCOUNTER — Encounter: Payer: Self-pay | Admitting: Family Medicine

## 2022-12-09 ENCOUNTER — Inpatient Hospital Stay
Admission: RE | Admit: 2022-12-09 | Discharge: 2022-12-12 | DRG: 617 | Disposition: A | Discharge status: Home Health | Payer: Medicare Other | Source: Ambulatory Visit | Attending: Internal Medicine | Admitting: Internal Medicine

## 2022-12-09 ENCOUNTER — Inpatient Hospital Stay: Payer: Medicare Other

## 2022-12-09 ENCOUNTER — Other Ambulatory Visit: Payer: Self-pay

## 2022-12-09 DIAGNOSIS — E1122 Type 2 diabetes mellitus with diabetic chronic kidney disease: Secondary | ICD-10-CM | POA: Diagnosis present

## 2022-12-09 DIAGNOSIS — Z89422 Acquired absence of other left toe(s): Secondary | ICD-10-CM | POA: Diagnosis not present

## 2022-12-09 DIAGNOSIS — E1165 Type 2 diabetes mellitus with hyperglycemia: Secondary | ICD-10-CM

## 2022-12-09 DIAGNOSIS — Z79899 Other long term (current) drug therapy: Secondary | ICD-10-CM | POA: Diagnosis not present

## 2022-12-09 DIAGNOSIS — I129 Hypertensive chronic kidney disease with stage 1 through stage 4 chronic kidney disease, or unspecified chronic kidney disease: Secondary | ICD-10-CM | POA: Diagnosis present

## 2022-12-09 DIAGNOSIS — E1169 Type 2 diabetes mellitus with other specified complication: Principal | ICD-10-CM | POA: Diagnosis present

## 2022-12-09 DIAGNOSIS — L089 Local infection of the skin and subcutaneous tissue, unspecified: Secondary | ICD-10-CM | POA: Diagnosis present

## 2022-12-09 DIAGNOSIS — M86172 Other acute osteomyelitis, left ankle and foot: Secondary | ICD-10-CM | POA: Diagnosis present

## 2022-12-09 DIAGNOSIS — M86672 Other chronic osteomyelitis, left ankle and foot: Secondary | ICD-10-CM | POA: Diagnosis present

## 2022-12-09 DIAGNOSIS — E785 Hyperlipidemia, unspecified: Secondary | ICD-10-CM | POA: Insufficient documentation

## 2022-12-09 DIAGNOSIS — Z89411 Acquired absence of right great toe: Secondary | ICD-10-CM

## 2022-12-09 DIAGNOSIS — Z96651 Presence of right artificial knee joint: Secondary | ICD-10-CM | POA: Diagnosis present

## 2022-12-09 DIAGNOSIS — Z8614 Personal history of Methicillin resistant Staphylococcus aureus infection: Secondary | ICD-10-CM

## 2022-12-09 DIAGNOSIS — B951 Streptococcus, group B, as the cause of diseases classified elsewhere: Secondary | ICD-10-CM | POA: Diagnosis present

## 2022-12-09 DIAGNOSIS — Z794 Long term (current) use of insulin: Secondary | ICD-10-CM

## 2022-12-09 DIAGNOSIS — E11628 Type 2 diabetes mellitus with other skin complications: Secondary | ICD-10-CM | POA: Diagnosis present

## 2022-12-09 DIAGNOSIS — M869 Osteomyelitis, unspecified: Secondary | ICD-10-CM | POA: Diagnosis present

## 2022-12-09 DIAGNOSIS — Z7984 Long term (current) use of oral hypoglycemic drugs: Secondary | ICD-10-CM

## 2022-12-09 DIAGNOSIS — E1140 Type 2 diabetes mellitus with diabetic neuropathy, unspecified: Secondary | ICD-10-CM | POA: Diagnosis present

## 2022-12-09 DIAGNOSIS — I10 Essential (primary) hypertension: Secondary | ICD-10-CM | POA: Diagnosis present

## 2022-12-09 DIAGNOSIS — M84475A Pathological fracture, left foot, initial encounter for fracture: Secondary | ICD-10-CM | POA: Diagnosis present

## 2022-12-09 DIAGNOSIS — Z87891 Personal history of nicotine dependence: Secondary | ICD-10-CM | POA: Diagnosis not present

## 2022-12-09 DIAGNOSIS — K219 Gastro-esophageal reflux disease without esophagitis: Secondary | ICD-10-CM | POA: Diagnosis present

## 2022-12-09 DIAGNOSIS — Z888 Allergy status to other drugs, medicaments and biological substances status: Secondary | ICD-10-CM

## 2022-12-09 DIAGNOSIS — N189 Chronic kidney disease, unspecified: Secondary | ICD-10-CM | POA: Diagnosis present

## 2022-12-09 LAB — CBC
HCT: 32.5 % — ABNORMAL LOW (ref 39.0–52.0)
Hemoglobin: 11.4 g/dL — ABNORMAL LOW (ref 13.0–17.0)
MCH: 28.7 pg (ref 26.0–34.0)
MCHC: 35.1 g/dL (ref 30.0–36.0)
MCV: 81.9 fL (ref 80.0–100.0)
Platelets: 306 10*3/uL (ref 150–400)
RBC: 3.97 MIL/uL — ABNORMAL LOW (ref 4.22–5.81)
RDW: 12.5 % (ref 11.5–15.5)
WBC: 8.4 10*3/uL (ref 4.0–10.5)
nRBC: 0 % (ref 0.0–0.2)

## 2022-12-09 LAB — GLUCOSE, CAPILLARY: Glucose-Capillary: 323 mg/dL — ABNORMAL HIGH (ref 70–99)

## 2022-12-09 LAB — COMPREHENSIVE METABOLIC PANEL
ALT: 14 U/L (ref 0–44)
AST: 14 U/L — ABNORMAL LOW (ref 15–41)
Albumin: 3.1 g/dL — ABNORMAL LOW (ref 3.5–5.0)
Alkaline Phosphatase: 116 U/L (ref 38–126)
Anion gap: 9 (ref 5–15)
BUN: 10 mg/dL (ref 8–23)
CO2: 28 mmol/L (ref 22–32)
Calcium: 8.9 mg/dL (ref 8.9–10.3)
Chloride: 95 mmol/L — ABNORMAL LOW (ref 98–111)
Creatinine, Ser: 0.83 mg/dL (ref 0.61–1.24)
GFR, Estimated: >60 mL/min (ref >60)
Glucose, Bld: 277 mg/dL — ABNORMAL HIGH (ref 70–99)
Potassium: 3.8 mmol/L (ref 3.5–5.1)
Sodium: 132 mmol/L — ABNORMAL LOW (ref 135–145)
Total Bilirubin: 0.3 mg/dL (ref 0.3–1.2)
Total Protein: 6.9 g/dL (ref 6.5–8.1)

## 2022-12-09 LAB — SURGICAL PCR SCREEN
MRSA, PCR: NEGATIVE
Staphylococcus aureus: NEGATIVE

## 2022-12-09 LAB — PREALBUMIN: Prealbumin: 20 mg/dL (ref 18–38)

## 2022-12-09 LAB — SEDIMENTATION RATE: Sed Rate: 56 mm/h — ABNORMAL HIGH (ref 0–20)

## 2022-12-09 LAB — C-REACTIVE PROTEIN: CRP: 0.7 mg/dL (ref <1.0)

## 2022-12-09 MED ORDER — VANCOMYCIN HCL 1250 MG/250ML IV SOLN
1250.0000 mg | Freq: Two times a day (BID) | INTRAVENOUS | Status: DC
  Administered 2022-12-10 – 2022-12-12 (×5): 1250 mg via INTRAVENOUS
  Filled 2022-12-09 (×5): qty 250

## 2022-12-09 MED ORDER — METRONIDAZOLE 500 MG/100ML IV SOLN
500.0000 mg | Freq: Three times a day (TID) | INTRAVENOUS | Status: DC
  Administered 2022-12-09 – 2022-12-11 (×5): 500 mg via INTRAVENOUS
  Filled 2022-12-09 (×6): qty 100

## 2022-12-09 MED ORDER — PANTOPRAZOLE SODIUM 40 MG PO TBEC
40.0000 mg | DELAYED_RELEASE_TABLET | Freq: Every day | ORAL | Status: DC
  Administered 2022-12-09 – 2022-12-12 (×4): 40 mg via ORAL
  Filled 2022-12-09 (×4): qty 1

## 2022-12-09 MED ORDER — VANCOMYCIN HCL 2000 MG/400ML IV SOLN
2000.0000 mg | Freq: Once | INTRAVENOUS | Status: AC
  Administered 2022-12-09: 2000 mg via INTRAVENOUS
  Filled 2022-12-09: qty 400

## 2022-12-09 MED ORDER — INSULIN DETEMIR 100 UNIT/ML ~~LOC~~ SOLN
15.0000 [IU] | Freq: Two times a day (BID) | SUBCUTANEOUS | Status: DC
  Administered 2022-12-09 – 2022-12-11 (×5): 15 [IU] via SUBCUTANEOUS
  Filled 2022-12-09 (×7): qty 0.15

## 2022-12-09 MED ORDER — HYDROMORPHONE HCL 1 MG/ML IJ SOLN
0.5000 mg | INTRAMUSCULAR | Status: DC | PRN
  Administered 2022-12-09 – 2022-12-11 (×6): 0.5 mg via INTRAVENOUS
  Filled 2022-12-09 (×7): qty 0.5

## 2022-12-09 MED ORDER — GABAPENTIN 300 MG PO CAPS
600.0000 mg | ORAL_CAPSULE | Freq: Two times a day (BID) | ORAL | Status: DC
  Administered 2022-12-09 – 2022-12-12 (×6): 600 mg via ORAL
  Filled 2022-12-09 (×6): qty 2

## 2022-12-09 MED ORDER — ONDANSETRON HCL 4 MG PO TABS: 4.0000 mg | ORAL_TABLET | Freq: Four times a day (QID) | ORAL | Status: DC | PRN

## 2022-12-09 MED ORDER — SODIUM CHLORIDE 0.9 % IV SOLN
2.0000 g | Freq: Three times a day (TID) | INTRAVENOUS | Status: DC
  Administered 2022-12-09 – 2022-12-12 (×8): 2 g via INTRAVENOUS
  Filled 2022-12-09 (×9): qty 12.5

## 2022-12-09 MED ORDER — METOPROLOL SUCCINATE ER 50 MG PO TB24
50.0000 mg | ORAL_TABLET | Freq: Every day | ORAL | Status: DC
  Administered 2022-12-11 – 2022-12-12 (×2): 50 mg via ORAL
  Filled 2022-12-09 (×2): qty 1

## 2022-12-09 MED ORDER — INSULIN ASPART 100 UNIT/ML IJ SOLN
0.0000 [IU] | Freq: Three times a day (TID) | INTRAMUSCULAR | Status: DC
  Administered 2022-12-10: 7 [IU] via SUBCUTANEOUS
  Administered 2022-12-10: 3 [IU] via SUBCUTANEOUS
  Administered 2022-12-11: 7 [IU] via SUBCUTANEOUS
  Administered 2022-12-11 (×2): 3 [IU] via SUBCUTANEOUS
  Administered 2022-12-12: 5 [IU] via SUBCUTANEOUS
  Filled 2022-12-09 (×6): qty 1

## 2022-12-09 MED ORDER — SODIUM CHLORIDE 0.9 % IV SOLN
2.0000 g | Freq: Once | INTRAVENOUS | Status: DC
  Filled 2022-12-09: qty 12.5

## 2022-12-09 MED ORDER — ENOXAPARIN SODIUM 40 MG/0.4ML IJ SOSY
40.0000 mg | PREFILLED_SYRINGE | INTRAMUSCULAR | Status: DC
  Administered 2022-12-10 – 2022-12-11 (×2): 40 mg via SUBCUTANEOUS
  Filled 2022-12-09 (×2): qty 0.4

## 2022-12-09 MED ORDER — ONDANSETRON HCL 4 MG/2ML IJ SOLN: 4.0000 mg | Freq: Four times a day (QID) | INTRAMUSCULAR | Status: DC | PRN

## 2022-12-09 MED ORDER — ROSUVASTATIN CALCIUM 20 MG PO TABS
40.0000 mg | ORAL_TABLET | Freq: Every day | ORAL | Status: DC
  Administered 2022-12-09 – 2022-12-12 (×4): 40 mg via ORAL
  Filled 2022-12-09: qty 2
  Filled 2022-12-09: qty 4
  Filled 2022-12-09: qty 2
  Filled 2022-12-09: qty 4
  Filled 2022-12-09 (×2): qty 2
  Filled 2022-12-09: qty 4

## 2022-12-09 NOTE — Progress Notes (Signed)
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 Per Dr. Ether Griffins, imaging concerning for osteomyelitis. Will need MRI and likely first ray amputation.  BP 156/88  Otherwise stable  Will accepted to med- surg

## 2022-12-09 NOTE — Progress Notes (Signed)
   Subjective/Chief Complaint: Patient admitted to the hospital today for osteomyelitis in his left first metatarsal.  Seen outpatient by Dr. Ether Griffins and had x-rays taken.  States he has been having some significant pain for the last couple of days.   Objective: Vital signs in last 24 hours: Temp:  [97.9 F (36.6 C)] 97.9 F (36.6 C) (09/06 1616) Pulse Rate:  [75] 75 (09/06 1616) Resp:  [18] 18 (09/06 1616) BP: (129)/(72) 129/72 (09/06 1616) SpO2:  [100 %] 100 % (09/06 1616)    Intake/Output from previous day: No intake/output data recorded. Intake/Output this shift: No intake/output data recorded.  DP and PT pulses are palpable.  There is loss of protective threshold with a monofilament wire in the feet.  Some erythema is noted at the first metatarsal where the hallux has been amputated.  Some granulomatous drainage area dorsally.  Plantar ulceration appears to be healed over at this point.  X-ray taken outpatient revealed significant destruction at the distal aspect of the first metatarsal consistent with acute osteomyelitis  Lab Results:  Recent Labs    12/09/22 1632  WBC 8.4  HGB 11.4*  HCT 32.5*  PLT 306   BMET No results for input(s): "NA", "K", "CL", "CO2", "GLUCOSE", "BUN", "CREATININE", "CALCIUM" in the last 72 hours. PT/INR No results for input(s): "LABPROT", "INR" in the last 72 hours. ABG No results for input(s): "PHART", "HCO3" in the last 72 hours.  Invalid input(s): "PCO2", "PO2"  Studies/Results: No results found.  Anti-infectives: Anti-infectives (From admission, onward)    Start     Dose/Rate Route Frequency Ordered Stop   12/09/22 1715  ceFEPIme (MAXIPIME) 2 g in sodium chloride 0.9 % 100 mL IVPB        2 g 200 mL/hr over 30 Minutes Intravenous  Once 12/09/22 1619     12/09/22 1700  metroNIDAZOLE (FLAGYL) IVPB 500 mg        500 mg 100 mL/hr over 60 Minutes Intravenous Every 8 hours 12/09/22 1604 12/16/22 1659       Assessment/Plan: s/p  Procedure(s): TRANSMETATARSAL AMPUTATION,  DEBRIDEMENT BONE VERSUS TMA (Left) Assessment: 1.  Osteomyelitis left first metatarsal.  2.  Diabetes with associated neuropathy.  Plan: Obtain MRI of the left foot for evaluation of extent of infection.  Discussed with the patient that if only the first metatarsal is involved that we hopefully will be able to just remove a portion of the first metatarsal.  If the second metatarsal phalangeal joint area is involved discussed that the more definitive procedure would be for transmetatarsal amputation on the left as he has previously had performed on the right.  Discussed with the patient possible risks and complications of the procedure including but not limited to inability of the wound to heal due to his diabetes or extent of infection.  Discussed phantom pain and swelling and or nerve issues.  No guarantees could be given as to the outcome.  Questions invited and answered.  We will obtain consent for both debridement of bone left foot versus transmetatarsal amputation depending on MRI findings.  Patient is n.p.o. after midnight.  Plan for surgery tomorrow morning.  LOS: 0 days    Ricci Barker 12/09/2022

## 2022-12-09 NOTE — Assessment & Plan Note (Signed)
?   Statin.

## 2022-12-09 NOTE — Assessment & Plan Note (Signed)
Acute on chronic left lower extremity diabetic foot infection concern for osteomyelitis Sent from podiatry clinic MRI of the left foot pending IV cefepime Flagyl and vancomycin for infectious coverage Blood cultures Dr. Alberteen Spindle w/ podiatry at the bedside-planning for operative repair in am  NPO PMN  Pain control  Follow up podiatry recommendations

## 2022-12-09 NOTE — Progress Notes (Signed)
Pharmacy Antibiotic Note  Jared Castillo is a 72 y.o. male admitted on 12/09/2022 with Osteomyelitis/Diabetic foot infection.  Pharmacy has been consulted for cefepime and vancomycin dosing.  -also ordered metronidazole -possible toe amputation 9/7  Plan: -Vancomycin 2000mg  IV x 1 as loading dose Then continue with Vancomycin 1250 mg IV Q 12 hrs. Goal AUC 400-550. Expected AUC: 482 SCr used: 0.83 Cmin 13.4   -Cefepime 2 gm IV q8h  for Crcl 92 ml/min  F/u renal fxn and cultures    Weight: 88.9 kg (196 lb)  Temp (24hrs), Avg:97.9 F (36.6 C), Min:97.9 F (36.6 C), Max:97.9 F (36.6 C)  Recent Labs  Lab 12/09/22 1632  WBC 8.4  CREATININE 0.83    Estimated Creatinine Clearance: 92.3 mL/min (by C-G formula based on SCr of 0.83 mg/dL).    Allergies  Allergen Reactions   Trazodone Other (See Comments)    Nightmares    Antimicrobials this admission: Cefepime 9/6 >>   Vancomycin  9/6 >>   Metronidazole 9/6>>  Dose adjustments this admission:    Microbiology results:   BCx:     UCx:      Sputum:      MRSA PCR:    Thank you for allowing pharmacy to be a part of this patient's care.  Myrl Bynum A 12/09/2022 6:29 PM

## 2022-12-09 NOTE — H&P (View-Only) (Signed)
   Subjective/Chief Complaint: Patient admitted to the hospital today for osteomyelitis in his left first metatarsal.  Seen outpatient by Dr. Ether Griffins and had x-rays taken.  States he has been having some significant pain for the last couple of days.   Objective: Vital signs in last 24 hours: Temp:  [97.9 F (36.6 C)] 97.9 F (36.6 C) (09/06 1616) Pulse Rate:  [75] 75 (09/06 1616) Resp:  [18] 18 (09/06 1616) BP: (129)/(72) 129/72 (09/06 1616) SpO2:  [100 %] 100 % (09/06 1616)    Intake/Output from previous day: No intake/output data recorded. Intake/Output this shift: No intake/output data recorded.  DP and PT pulses are palpable.  There is loss of protective threshold with a monofilament wire in the feet.  Some erythema is noted at the first metatarsal where the hallux has been amputated.  Some granulomatous drainage area dorsally.  Plantar ulceration appears to be healed over at this point.  X-ray taken outpatient revealed significant destruction at the distal aspect of the first metatarsal consistent with acute osteomyelitis  Lab Results:  Recent Labs    12/09/22 1632  WBC 8.4  HGB 11.4*  HCT 32.5*  PLT 306   BMET No results for input(s): "NA", "K", "CL", "CO2", "GLUCOSE", "BUN", "CREATININE", "CALCIUM" in the last 72 hours. PT/INR No results for input(s): "LABPROT", "INR" in the last 72 hours. ABG No results for input(s): "PHART", "HCO3" in the last 72 hours.  Invalid input(s): "PCO2", "PO2"  Studies/Results: No results found.  Anti-infectives: Anti-infectives (From admission, onward)    Start     Dose/Rate Route Frequency Ordered Stop   12/09/22 1715  ceFEPIme (MAXIPIME) 2 g in sodium chloride 0.9 % 100 mL IVPB        2 g 200 mL/hr over 30 Minutes Intravenous  Once 12/09/22 1619     12/09/22 1700  metroNIDAZOLE (FLAGYL) IVPB 500 mg        500 mg 100 mL/hr over 60 Minutes Intravenous Every 8 hours 12/09/22 1604 12/16/22 1659       Assessment/Plan: s/p  Procedure(s): TRANSMETATARSAL AMPUTATION,  DEBRIDEMENT BONE VERSUS TMA (Left) Assessment: 1.  Osteomyelitis left first metatarsal.  2.  Diabetes with associated neuropathy.  Plan: Obtain MRI of the left foot for evaluation of extent of infection.  Discussed with the patient that if only the first metatarsal is involved that we hopefully will be able to just remove a portion of the first metatarsal.  If the second metatarsal phalangeal joint area is involved discussed that the more definitive procedure would be for transmetatarsal amputation on the left as he has previously had performed on the right.  Discussed with the patient possible risks and complications of the procedure including but not limited to inability of the wound to heal due to his diabetes or extent of infection.  Discussed phantom pain and swelling and or nerve issues.  No guarantees could be given as to the outcome.  Questions invited and answered.  We will obtain consent for both debridement of bone left foot versus transmetatarsal amputation depending on MRI findings.  Patient is n.p.o. after midnight.  Plan for surgery tomorrow morning.  LOS: 0 days    Ricci Barker 12/09/2022

## 2022-12-09 NOTE — Assessment & Plan Note (Signed)
BP stable Titrate home regimen 

## 2022-12-09 NOTE — Assessment & Plan Note (Signed)
Last A1C 10/2022 around 12  [facility]  A1C  Diabetic educator

## 2022-12-09 NOTE — H&P (Addendum)
History and Physical    Patient: Jared Castillo WUJ:[id number] DOB: Nov 25, 1950 DOA: 12/09/2022 DOS: the patient was seen and examined on 12/09/2022 PCP: Verner Mould, MD  Patient coming from: Home  Chief Complaint: No chief complaint on file.  HPI: Jared Castillo is a 72 y.o. 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  Patient with known history of recurrent diabetic foot infections.  Has had multiple surgeries including recent amputation of the left toe July 2024.  Prior right great and second toe amputation.  Was seen in podiatry clinic today with noted worsening wound follow-up of left foot.  Preliminary imaging concerning for infection per Dr. Ether Griffins.  Recommended admission for further evaluation.  Patient does report generalized malaise.?  Purulent drainage at home.  Blood sugars poorly controlled at baseline.  Non-smoker.  No reported alcohol use. Presented to the hospital for afebrile, hemodynamically stable.  Labs and imaging pending.  Dr. Alberteen Spindle with podiatry at the bedside. Review of Systems: As mentioned in the history of present illness. All other systems reviewed and are negative. Past Medical History:  Diagnosis Date   Chronic kidney disease    stones   Diabetes mellitus without complication (HCC)    GERD (gastroesophageal reflux disease)    Hypertension    Shingles rash 12   dx yesterday 03/21/11   Past Surgical History:  Procedure Laterality Date   AMPUTATION TOE Left 10/09/2022   Procedure: AMPUTATION TOE;  Surgeon: Gwyneth Revels, DPM;  Location: [facility];  Service: Orthopedics/Podiatry;  Laterality: Left;   APPENDECTOMY     child   IRRIGATION AND DEBRIDEMENT FOOT Right 08/23/2019   Procedure: RIGHT FOOT IRRIGATION AND DEBRIDEMENT, RIGHT ACHILLES TENDON LENGTHENING,EXCISION RIGHT FIRST METATARSAL;  Surgeon: Gwyneth Revels, DPM;  Location: [facility];  Service:  Podiatry;  Laterality: Right;   IRRIGATION AND DEBRIDEMENT FOOT Right 02/16/2022   Procedure: IRRIGATION AND DEBRIDEMENT FOOT;  Surgeon: Gwyneth Revels, DPM;  Location: [facility];  Service: Podiatry;  Laterality: Right;   LOWER EXTREMITY ANGIOGRAPHY Left 10/11/2022   Procedure: Lower Extremity Angiography;  Surgeon: Annice Needy, MD;  Location: [facility];  Service: Cardiovascular;  Laterality: Left;   TOE AMPUTATION  79   rt great and second   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 He has never used smokeless tobacco. He reports that he does not currently use alcohol after a past usage of about 2.0 - 3.0 standard drinks of alcohol per week. He reports that he does not use drugs.  Allergies  Allergen Reactions   Trazodone Other (See Comments)    Nightmares    No family history on file.  Prior to Admission medications   Medication Sig Start Date End Date Taking? Authorizing Provider  albuterol (VENTOLIN HFA) 108 (90 Base) MCG/ACT inhaler Inhale 1-2 puffs into the lungs every 6 (six) hours as needed for wheezing or shortness of breath.    [provider]  fluticasone (VERAMYST) 27.5 MCG/SPRAY nasal spray Place 2 sprays into the nose daily.    [provider]  gabapentin (NEURONTIN) 300  MG capsule Take 600 mg by mouth 2 (two) times daily. 2 caps TID    [provider]  HYDROcodone-acetaminophen (NORCO/VICODIN) 5-325 MG tablet Take 1 tablet by mouth every 6 (six) hours as needed for moderate pain. 10/12/22   Marcelino Duster, MD  insulin detemir (LEVEMIR) 100 UNIT/ML  injection Inject 0.23 mLs (23 Units total) into the skin 2 (two) times daily. 10/12/22   Marcelino Duster, MD  insulin lispro (HUMALOG) 100 UNIT/ML injection Inject 0-10 Units into the skin 3 (three) times daily as needed for high blood sugar. Sliding scale as needed for BG over 200.    [provider]  lisinopril-hydrochlorothiazide (ZESTORETIC) 20-25 MG tablet Take 0.5 tablets by mouth daily.    [provider]  metFORMIN (GLUCOPHAGE) 1000 MG tablet Take 1,000 mg by mouth 2 (two) times daily with a meal.    [provider]  metoprolol succinate (TOPROL-XL) 50 MG 24 hr tablet Take 50 mg by mouth daily. Take with or immediately following a meal.    [provider]  Multiple Vitamin (MULTIVITAMIN WITH MINERALS) TABS tablet Take 1 tablet by mouth daily. 02/18/22   Sunnie Nielsen, DO  mupirocin ointment (BACTROBAN) 2 % Place 1 Application into the nose 2 (two) times daily. 10/12/22   Marcelino Duster, MD  omeprazole (PRILOSEC) 20 MG capsule Take 20 mg by mouth 2 (two) times daily.    [provider]  rosuvastatin (CRESTOR) 40 MG tablet Take 40 mg by mouth daily.    [provider]  Sodium Chloride Flush (SALINE FLUSH) 0.9 % SOLN As needed for dressing changes Patient not taking: Reported on 10/07/2022 02/17/22   Sunnie Nielsen, DO    Physical Exam: Vitals:   12/09/22 1616  BP: 129/72  Pulse: 75  Resp: 18  Temp: 97.9 F (36.6 C)  SpO2: 100%   Physical Exam Constitutional:      Appearance: He is normal weight.  HENT:     Head: Normocephalic and atraumatic.     Nose: Nose normal.     Mouth/Throat:     Mouth: Mucous membranes are moist.  Cardiovascular:     Rate and Rhythm: Normal rate and regular rhythm.  Pulmonary:     Effort: Pulmonary effort is normal.  Abdominal:     General: Bowel sounds are normal.  Musculoskeletal:     Cervical back: Normal range of motion.     Comments: L foot wrapped    Skin:    General:  Skin is warm.  Neurological:     General: No focal deficit present.  Psychiatric:        Mood and Affect: Mood normal.        Judgment: Judgment normal.     Data Reviewed:  There are no new results to review at this time. PERIPHERAL VASCULAR CATHETERIZATION See surgical note for result.  Lab Results  Component Value Date   WBC 8.4 12/09/2022   HGB 11.4 (L) 12/09/2022   HCT 32.5 (L) 12/09/2022   MCV 81.9 12/09/2022   PLT 306 12/09/2022   Last metabolic panel Lab Results  Component Value Date   GLUCOSE 277 (H) 12/09/2022   NA 132 (L) 12/09/2022   K 3.8 12/09/2022   CL 95 (L) 12/09/2022   CO2 28 12/09/2022   BUN 10 12/09/2022   CREATININE 0.83 12/09/2022   GFRNONAA >60 12/09/2022   CALCIUM 8.9 12/09/2022   PROT 6.9 12/09/2022   ALBUMIN 3.1 (L) 12/09/2022   BILITOT 0.3 12/09/2022  ALKPHOS 116 12/09/2022   AST 14 (L) 12/09/2022   ALT 14 12/09/2022   ANIONGAP 9 12/09/2022    Assessment and Plan: * Osteomyelitis (HCC) Acute on chronic left lower extremity diabetic foot infection concern for osteomyelitis Sent from podiatry clinic MRI of the left foot pending IV cefepime Flagyl and vancomycin for infectious coverage Blood cultures Dr. Alberteen Spindle w/ podiatry at the bedside-planning for operative repair in am  NPO PMN  Pain control  Follow up podiatry recommendations   Uncontrolled type 2 diabetes mellitus with hyperglycemia, with long-term current use of insulin (HCC) Last A1C 10/2022 around 12  [facility]I  A1C  Diabetic educator    Essential hypertension BP stable  Titrate home regimen    HLD (hyperlipidemia) Statin     Greater than 50% was spent in counseling and coordination of care with patient Total encounter time 80 minutes or more   Advance Care Planning:   Code Status: Full Code   Consults: Podiatry   Family Communication: Wife at the bedside   Severity of Illness: The appropriate patient status for this patient is INPATIENT. Inpatient status is  judged to be reasonable and necessary in order to provide the required intensity of service to ensure the patient's safety. The patient's presenting symptoms, physical exam findings, and initial radiographic and laboratory data in the context of their chronic comorbidities is felt to place them at high risk for further clinical deterioration. Furthermore, it is not anticipated that the patient will be medically stable for discharge from the hospital within 2 midnights of admission.   * I certify that at the point of admission it is my clinical judgment that the patient will require inpatient hospital care spanning beyond 2 midnights from the point of admission due to high intensity of service, high risk for further deterioration and high frequency of surveillance required.*  Author: Floydene Flock, MD 12/09/2022 5:35 PM  For on call review [email or web address].

## 2022-12-10 ENCOUNTER — Encounter
Admission: RE | Disposition: A | Discharge status: Home Health | Payer: Self-pay | Source: Home / Self Care | Attending: Internal Medicine

## 2022-12-10 ENCOUNTER — Other Ambulatory Visit: Payer: Self-pay

## 2022-12-10 ENCOUNTER — Inpatient Hospital Stay: Payer: Self-pay | Admitting: Anesthesiology

## 2022-12-10 ENCOUNTER — Inpatient Hospital Stay: Payer: Medicare Other | Admitting: Anesthesiology

## 2022-12-10 HISTORY — PX: TRANSMETATARSAL AMPUTATION: SHX6197

## 2022-12-10 LAB — COMPREHENSIVE METABOLIC PANEL
ALT: 12 U/L (ref 0–44)
AST: 13 U/L — ABNORMAL LOW (ref 15–41)
Albumin: 2.7 g/dL — ABNORMAL LOW (ref 3.5–5.0)
Alkaline Phosphatase: 80 U/L (ref 38–126)
Anion gap: 8 (ref 5–15)
BUN: 9 mg/dL (ref 8–23)
CO2: 27 mmol/L (ref 22–32)
Calcium: 8.9 mg/dL (ref 8.9–10.3)
Chloride: 98 mmol/L (ref 98–111)
Creatinine, Ser: 0.84 mg/dL (ref 0.61–1.24)
GFR, Estimated: >60 mL/min (ref >60)
Glucose, Bld: 187 mg/dL — ABNORMAL HIGH (ref 70–99)
Potassium: 3.8 mmol/L (ref 3.5–5.1)
Sodium: 133 mmol/L — ABNORMAL LOW (ref 135–145)
Total Bilirubin: 0.4 mg/dL (ref 0.3–1.2)
Total Protein: 6.5 g/dL (ref 6.5–8.1)

## 2022-12-10 LAB — CBC
HCT: 33.7 % — ABNORMAL LOW (ref 39.0–52.0)
Hemoglobin: 11.9 g/dL — ABNORMAL LOW (ref 13.0–17.0)
MCH: 29.2 pg (ref 26.0–34.0)
MCHC: 35.3 g/dL (ref 30.0–36.0)
MCV: 82.6 fL (ref 80.0–100.0)
Platelets: 277 10*3/uL (ref 150–400)
RBC: 4.08 MIL/uL — ABNORMAL LOW (ref 4.22–5.81)
RDW: 12.7 % (ref 11.5–15.5)
WBC: 6.1 10*3/uL (ref 4.0–10.5)
nRBC: 0 % (ref 0.0–0.2)

## 2022-12-10 LAB — GLUCOSE, CAPILLARY
Glucose-Capillary: 218 mg/dL — ABNORMAL HIGH (ref 70–99)
Glucose-Capillary: 196 mg/dL — ABNORMAL HIGH (ref 70–99)
Glucose-Capillary: 207 mg/dL — ABNORMAL HIGH (ref 70–99)
Glucose-Capillary: 305 mg/dL — ABNORMAL HIGH (ref 70–99)
Glucose-Capillary: 233 mg/dL — ABNORMAL HIGH (ref 70–99)

## 2022-12-10 SURGERY — AMPUTATION, FOOT, TRANSMETATARSAL
Anesthesia: General | Site: Foot | Laterality: Left

## 2022-12-10 MED ORDER — 0.9 % SODIUM CHLORIDE (POUR BTL) OPTIME
TOPICAL | Status: DC | PRN
  Administered 2022-12-10: 500 mL

## 2022-12-10 MED ORDER — ONDANSETRON HCL 4 MG/2ML IJ SOLN
INTRAMUSCULAR | Status: DC | PRN
  Administered 2022-12-10: 4 mg via INTRAVENOUS

## 2022-12-10 MED ORDER — VANCOMYCIN HCL 1000 MG IV SOLR
INTRAVENOUS | Status: DC | PRN
Start: 2022-12-10 — End: 2022-12-10
  Administered 2022-12-10: 1000 mg

## 2022-12-10 MED ORDER — EPHEDRINE SULFATE (PRESSORS) 50 MG/ML IJ SOLN
INTRAMUSCULAR | Status: DC | PRN
  Administered 2022-12-10: 10 mg via INTRAVENOUS
  Administered 2022-12-10: 5 mg via INTRAVENOUS
  Administered 2022-12-10: 10 mg via INTRAVENOUS

## 2022-12-10 MED ORDER — BUPIVACAINE HCL (PF) 0.5 % IJ SOLN
INTRAMUSCULAR | Status: DC | PRN
  Administered 2022-12-10: 10 mL

## 2022-12-10 MED ORDER — PROPOFOL 10 MG/ML IV BOLUS
INTRAVENOUS | Status: AC
  Filled 2022-12-10: qty 20

## 2022-12-10 MED ORDER — SODIUM CHLORIDE 0.9 % IV SOLN
INTRAVENOUS | Status: DC | PRN
Start: 2022-12-10 — End: 2022-12-10

## 2022-12-10 MED ORDER — VANCOMYCIN HCL 1000 MG IV SOLR
INTRAVENOUS | Status: AC
  Filled 2022-12-10: qty 20

## 2022-12-10 MED ORDER — BUPIVACAINE HCL (PF) 0.5 % IJ SOLN
INTRAMUSCULAR | Status: AC
  Filled 2022-12-10: qty 30

## 2022-12-10 MED ORDER — MORPHINE SULFATE (PF) 2 MG/ML IV SOLN
2.0000 mg | INTRAVENOUS | Status: DC | PRN
  Administered 2022-12-12 (×2): 2 mg via INTRAVENOUS
  Filled 2022-12-10 (×2): qty 1

## 2022-12-10 MED ORDER — LIDOCAINE HCL (PF) 2 % IJ SOLN
INTRAMUSCULAR | Status: AC
  Filled 2022-12-10: qty 5

## 2022-12-10 MED ORDER — ONDANSETRON HCL 4 MG/2ML IJ SOLN
INTRAMUSCULAR | Status: AC
  Filled 2022-12-10: qty 2

## 2022-12-10 MED ORDER — ACETAMINOPHEN 10 MG/ML IV SOLN
INTRAVENOUS | Status: AC
  Filled 2022-12-10: qty 100

## 2022-12-10 MED ORDER — ACETAMINOPHEN 10 MG/ML IV SOLN: 1000.0000 mg | Freq: Once | INTRAVENOUS | Status: DC | PRN

## 2022-12-10 MED ORDER — FENTANYL CITRATE (PF) 100 MCG/2ML IJ SOLN
INTRAMUSCULAR | Status: AC
  Filled 2022-12-10: qty 2

## 2022-12-10 MED ORDER — OXYCODONE HCL 5 MG/5ML PO SOLN: 5.0000 mg | Freq: Once | ORAL | Status: DC | PRN

## 2022-12-10 MED ORDER — OXYCODONE HCL 5 MG PO TABS: 5.0000 mg | ORAL_TABLET | Freq: Once | ORAL | Status: DC | PRN

## 2022-12-10 MED ORDER — SODIUM CHLORIDE (PF) 0.9 % IJ SOLN
INTRAMUSCULAR | Status: AC
  Filled 2022-12-10: qty 20

## 2022-12-10 MED ORDER — MIDAZOLAM HCL 2 MG/2ML IJ SOLN
INTRAMUSCULAR | Status: DC | PRN
  Administered 2022-12-10: 2 mg via INTRAVENOUS

## 2022-12-10 MED ORDER — LIDOCAINE HCL (CARDIAC) PF 100 MG/5ML IV SOSY
PREFILLED_SYRINGE | INTRAVENOUS | Status: DC | PRN
  Administered 2022-12-10: 100 mg via INTRAVENOUS

## 2022-12-10 MED ORDER — MIDAZOLAM HCL 2 MG/2ML IJ SOLN
INTRAMUSCULAR | Status: AC
  Filled 2022-12-10: qty 2

## 2022-12-10 MED ORDER — OXYCODONE-ACETAMINOPHEN 5-325 MG PO TABS
1.0000 | ORAL_TABLET | ORAL | Status: DC | PRN
  Administered 2022-12-10 – 2022-12-12 (×10): 2 via ORAL
  Filled 2022-12-10 (×10): qty 2

## 2022-12-10 MED ORDER — ACETAMINOPHEN 10 MG/ML IV SOLN
INTRAVENOUS | Status: DC | PRN
  Administered 2022-12-10: 1000 mg via INTRAVENOUS

## 2022-12-10 MED ORDER — FENTANYL CITRATE (PF) 100 MCG/2ML IJ SOLN: 25.0000 ug | INTRAMUSCULAR | Status: DC | PRN

## 2022-12-10 MED ORDER — DEXAMETHASONE SODIUM PHOSPHATE 10 MG/ML IJ SOLN
INTRAMUSCULAR | Status: AC
  Filled 2022-12-10: qty 1

## 2022-12-10 MED ORDER — ONDANSETRON HCL 4 MG/2ML IJ SOLN: 4.0000 mg | Freq: Once | INTRAMUSCULAR | Status: DC | PRN

## 2022-12-10 MED ORDER — PROPOFOL 10 MG/ML IV BOLUS
INTRAVENOUS | Status: DC | PRN
  Administered 2022-12-10: 120 mg via INTRAVENOUS

## 2022-12-10 MED ORDER — CEFAZOLIN SODIUM-DEXTROSE 2-3 GM-%(50ML) IV SOLR
INTRAVENOUS | Status: DC | PRN
Start: 2022-12-10 — End: 2022-12-10
  Administered 2022-12-10: 2 g via INTRAVENOUS

## 2022-12-10 MED ORDER — FENTANYL CITRATE (PF) 100 MCG/2ML IJ SOLN
INTRAMUSCULAR | Status: DC | PRN
  Administered 2022-12-10 (×2): 25 ug via INTRAVENOUS

## 2022-12-10 MED ORDER — CEFAZOLIN SODIUM 1 G IJ SOLR
INTRAMUSCULAR | Status: AC
  Filled 2022-12-10: qty 20

## 2022-12-10 SURGICAL SUPPLY — 48 items
BLADE MED AGGRESSIVE (BLADE) ×1 IMPLANT
BNDG CMPR 5X4 CHSV STRCH STRL (GAUZE/BANDAGES/DRESSINGS)
BNDG CMPR STD VLCR NS LF 5.8X4 (GAUZE/BANDAGES/DRESSINGS)
BNDG COHESIVE 4X5 TAN STRL LF (GAUZE/BANDAGES/DRESSINGS) ×1 IMPLANT
BNDG ELASTIC 4X5.8 VLCR NS LF (GAUZE/BANDAGES/DRESSINGS) ×1 IMPLANT
BNDG ESMARCH 4 X 12 STRL LF (GAUZE/BANDAGES/DRESSINGS) ×1
BNDG ESMARCH 4X12 STRL LF (GAUZE/BANDAGES/DRESSINGS) ×1 IMPLANT
BNDG GAUZE DERMACEA FLUFF 4 (GAUZE/BANDAGES/DRESSINGS) ×1 IMPLANT
BNDG GZE DERMACEA 4 6PLY (GAUZE/BANDAGES/DRESSINGS) ×1
CUFF TOURN SGL QUICK 12 (TOURNIQUET CUFF) IMPLANT
CUFF TOURN SGL QUICK 18X4 (TOURNIQUET CUFF) IMPLANT
DRAIN PENROSE 12X.25 LTX STRL (MISCELLANEOUS) ×1 IMPLANT
DRAPE FLUOR MINI C-ARM 54X84 (DRAPES) ×1 IMPLANT
DRSG GAUZE FLUFF 36X18 (GAUZE/BANDAGES/DRESSINGS) ×1 IMPLANT
DURAPREP 26ML APPLICATOR (WOUND CARE) ×1 IMPLANT
ELECT REM PT RETURN 9FT ADLT (ELECTROSURGICAL) ×1
ELECTRODE REM PT RTRN 9FT ADLT (ELECTROSURGICAL) ×1 IMPLANT
GAUZE SPONGE 4X4 12PLY STRL (GAUZE/BANDAGES/DRESSINGS) ×1 IMPLANT
GAUZE XEROFORM 1X8 LF (GAUZE/BANDAGES/DRESSINGS) ×1 IMPLANT
GLOVE BIO SURGEON STRL SZ7.5 (GLOVE) ×1 IMPLANT
GLOVE INDICATOR 8.0 STRL GRN (GLOVE) ×1 IMPLANT
GOWN STRL REUS W/ TWL LRG LVL3 (GOWN DISPOSABLE) ×2 IMPLANT
GOWN STRL REUS W/TWL LRG LVL3 (GOWN DISPOSABLE) ×2
HANDLE YANKAUER SUCT BULB TIP (MISCELLANEOUS) ×1 IMPLANT
HANDPIECE VERSAJET DEBRIDEMENT (MISCELLANEOUS) ×1 IMPLANT
IV NS IRRIG 3000ML ARTHROMATIC (IV SOLUTION) ×1 IMPLANT
KIT STIMULAN RAPID CURE 5CC (Orthopedic Implant) IMPLANT
KIT TURNOVER KIT A (KITS) ×1 IMPLANT
LABEL OR SOLS (LABEL) ×1 IMPLANT
MANIFOLD NEPTUNE II (INSTRUMENTS) ×1 IMPLANT
NDL SAFETY ECLIP 18X1.5 (MISCELLANEOUS) ×1 IMPLANT
NS IRRIG 500ML POUR BTL (IV SOLUTION) ×1 IMPLANT
PACK EXTREMITY ARMC (MISCELLANEOUS) ×1 IMPLANT
PAD ABD DERMACEA PRESS 5X9 (GAUZE/BANDAGES/DRESSINGS) ×1 IMPLANT
SOL PREP PVP 2OZ (MISCELLANEOUS) ×1
SOLUTION PREP PVP 2OZ (MISCELLANEOUS) ×1 IMPLANT
SPONGE T-LAP 18X18 ~~LOC~~+RFID (SPONGE) ×1 IMPLANT
STAPLER SKIN PROX 35W (STAPLE) ×1 IMPLANT
STOCKINETTE STRL 6IN 960660 (GAUZE/BANDAGES/DRESSINGS) ×1 IMPLANT
STRAP SAFETY 5IN WIDE (MISCELLANEOUS) ×1 IMPLANT
SUT ETHILON 3-0 FS-10 30 BLK (SUTURE) ×2
SUT VIC AB 2-0 CT1 27 (SUTURE)
SUT VIC AB 2-0 CT1 TAPERPNT 27 (SUTURE) ×2 IMPLANT
SUT VICRYL+ 3-0 36IN CT-1 (SUTURE) ×2 IMPLANT
SUTURE EHLN 3-0 FS-10 30 BLK (SUTURE) IMPLANT
SYR 10ML LL (SYRINGE) ×2 IMPLANT
TRAP FLUID SMOKE EVACUATOR (MISCELLANEOUS) ×1 IMPLANT
WATER STERILE IRR 500ML POUR (IV SOLUTION) ×1 IMPLANT

## 2022-12-10 NOTE — Hospital Course (Addendum)
Taken from H&P.  Jared Castillo is a 72 y.o. 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  Patient with known history of recurrent diabetic foot infections.  Has had multiple surgeries including recent amputation of the left toe July 2024.  Prior right great and second toe amputation.  Was seen in podiatry clinic today with noted worsening wound follow-up of left foot.  Preliminary imaging concerning for infection per Dr. Ether Griffins. Recommended admission for further evaluation.   On presentation hemodynamically stable, MRI ordered and started on cefepime and vancomycin.  9/7: Vital and labs stable, CRP normal, prealbumin normal at 20, ESR elevated at 56. History of MRSA and group B strep on prior wound cultures.  MRI with acute osteomyelitis of first metatarsal and both hallux sesamoids, also noted and nondisplaced pathologic fracture of first metatarsal neck.  Podiatry took him to the OR s/p incision and drainage and bone debridement, cultures were sent.  9/8: Vitals and labs stable.  A1c of 11.7 and CBG remained elevated-adding 3 units for mealtime coverage.  PT is recommending home health.  Intraoperative pathology and cultures are pending.  9/9: Remained hemodynamically stable.  Bone cultures with group B streptococcus only, no MRSA noted at this time but patient was on Doxy at home.  Bone pathology results are still pending.  Patient is being discharged on cefadroxil and doxycycline for 2 weeks and will follow-up closely with his podiatrist.  If his bone pathology margins are positive then he might need little more prolonged antibiotics, likely up to 6 weeks.  If margins are negative then 2 weeks of antibiotics should be sufficient.  Discussed with his podiatrist and they will follow-up on final culture and pathology results.  Patient will follow-up closely with them as  outpatient.  Patient will remain nonweightbearing on left forefoot, should be weightbearing on the heel.  Boot was provided.  Home health services were ordered.  Patient will continue with rest of his home medications and need to have a close follow-up with his providers for further recommendations.

## 2022-12-10 NOTE — Anesthesia Preprocedure Evaluation (Signed)
Anesthesia Evaluation  Patient identified by MRN, date of birth, ID band Patient awake    Reviewed: Allergy & Precautions, NPO status , Patient's Chart, lab work & pertinent test results  History of Anesthesia Complications Negative for: history of anesthetic complications  Airway Mallampati: II  TM Distance: >3 FB Neck ROM: Full    Dental  (+) Edentulous Upper, Edentulous Lower   Pulmonary neg sleep apnea, COPD, Patient abstained from smoking.Not current smoker, former smoker   Pulmonary exam normal breath sounds clear to auscultation       Cardiovascular Exercise Tolerance: Good METShypertension, Pt. on medications (-) CAD and (-) Past MI Normal cardiovascular exam(-) dysrhythmias  Rhythm:Regular Rate:Normal - Systolic murmurs    Neuro/Psych  Neuromuscular disease (diabetic polyneuropathy)  negative psych ROS   GI/Hepatic ,GERD  Controlled and Medicated,,(+)     (-) substance abuse    Endo/Other  diabetes, Poorly Controlled, Type 2    Renal/GU CRFRenal disease (CKD, nephrolithiasis)     Musculoskeletal  (+) Arthritis ,    Abdominal   Peds  Hematology negative hematology ROS (+)   Anesthesia Other Findings Past Medical History: No date: Chronic kidney disease     Comment:  stones No date: Diabetes mellitus without complication (HCC) No date: GERD (gastroesophageal reflux disease) No date: Hypertension 12: Shingles rash     Comment:  dx yesterday 03/21/11  Reproductive/Obstetrics                             Anesthesia Physical Anesthesia Plan  ASA: 3  Anesthesia Plan: General   Post-op Pain Management: Ofirmev IV (intra-op)*   Induction: Intravenous  PONV Risk Score and Plan: 2 and Ondansetron, Dexamethasone, Treatment may vary due to age or medical condition and Midazolam  Airway Management Planned: LMA  Additional Equipment: None  Intra-op Plan:   Post-operative  Plan: Extubation in OR  Informed Consent: I have reviewed the patients History and Physical, chart, labs and discussed the procedure including the risks, benefits and alternatives for the proposed anesthesia with the patient or authorized representative who has indicated his/her understanding and acceptance.     Dental advisory given  Plan Discussed with: CRNA  Anesthesia Plan Comments: (Discussed risks of anesthesia with patient, including PONV, sore throat, lip/dental/eye damage. Rare risks discussed as well, such as cardiorespiratory and neurological sequelae, and allergic reactions. Discussed the role of CRNA in patient's perioperative care. Patient understands.)       Anesthesia Quick Evaluation

## 2022-12-10 NOTE — Op Note (Signed)
Date of operation: 12/10/2022.  Surgeon: Ricci Barker D.P.M.  Preoperative diagnosis: 1.  Osteomyelitis left first metatarsal.  Postoperative diagnosis: Same.  Procedures: 1.  I&D left foot soft tissue and bone.  Anesthesia: LMA.  Hemostasis: Pneumatic tourniquet left ankle 250 mmHg.  Estimated blood loss: Less than 5 cc.  Cultures: 1.  Deep wound abscess. 2.  Bone left first metatarsal.  Pathology: Left first metatarsal and sesamoids.  Implants: Stimulan rapid cure antibiotic beads impregnated with vancomycin.  Injectables: 10 cc 0.5% Marcaine plain.  Complications: None apparent.  Operative indications: This is a 72 year old diabetic male with recent amputation of his left great toe.  Continued to have a wound with some drainage and purulence, recently exacerbated and patient was admitted with obvious osteomyelitis on x-ray confirmed with MRI.  Decision made for debridement left foot.  Operative procedure: Patient was taken to the operating room and placed on the table in the supine position.  Following satisfactory LMA anesthesia a pneumatic tourniquet was applied at the level of the left ankle and the foot was prepped and draped in usual sterile fashion.  The left foot was exsanguinated using an Esmarch and the tourniquet inflated to 250 mmHg.  Attention was directed to the medial aspect of the left forefoot where an incision was made from proximal to distal along the medial shaft of the first metatarsal ending at the first webspace area.  Incision was carried sharply down to the level of bone.  Purulence was encountered and deep wound culture was taken.  Soft tissue was reflected off of the first metatarsal and carried back proximally where the first metatarsal was incised using a sagittal saw and removed in toto.  Portion of the head of the metatarsal was taken for bone culture and the bone passed off for pathology.  Sesamoids noted to be remaining in the soft tissues and these were  sharply excised using a Beaver blade.  The entire wound was then thoroughly debrided with a Versajet debrider on a setting of 4 and irrigated with copious amounts of sterile saline.  Incision partially closed proximally with 3-0 nylon simple interrupted sutures and Stimulan rapid cure antibiotic beads impregnated with vancomycin placed within the wound and the remainder of the incision was closed using 3-0 nylon vertical mattress and simple interrupted sutures.  A couple of simple interrupted sutures placed across the open wound area dorsally on the foot from the previous incision.  10 cc of 0.5% Marcaine plain then injected for postoperative analgesia.  The foot was washed and dried and bandaging applied with Xeroform 4 x 4's ABD and Kerlix.  Tourniquet was released and a second Kerlix and Ace wrap then applied for compression.  Patient was awakened and transported the PACU having tolerated the anesthesia and procedure well.

## 2022-12-10 NOTE — Anesthesia Procedure Notes (Signed)
Procedure Name: LMA Insertion Date/Time: 12/10/2022 9:12 AM  Performed by: Stormy Fabian, CRNAPre-anesthesia Checklist: Patient identified, Patient being monitored, Timeout performed, Emergency Drugs available and Suction available Patient Re-evaluated:Patient Re-evaluated prior to induction Oxygen Delivery Method: Circle system utilized Preoxygenation: Pre-oxygenation with 100% oxygen Induction Type: IV induction Ventilation: Mask ventilation without difficulty LMA: LMA inserted Tube type: Oral Tube size: 4.0 mm Number of attempts: 1 Placement Confirmation: positive ETCO2 and breath sounds checked- equal and bilateral Tube secured with: Tape Dental Injury: Teeth and Oropharynx as per pre-operative assessment

## 2022-12-10 NOTE — Progress Notes (Signed)
  Progress Note   Patient: Jared Castillo MWU:[id number] DOB: 07-Sep-1950 DOA: 12/09/2022     1 DOS: the patient was seen and examined on 12/10/2022   Brief hospital course: Taken from H&P.  Maxymilian Sherratt is a 72 y.o. 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  Patient with known history of recurrent diabetic foot infections.  Has had multiple surgeries including recent amputation of the left toe July 2024.  Prior right great and second toe amputation.  Was seen in podiatry clinic today with noted worsening wound follow-up of left foot.  Preliminary imaging concerning for infection per Dr. Ether Griffins. Recommended admission for further evaluation.   On presentation hemodynamically stable, MRI ordered and started on cefepime and vancomycin.  9/7: Vital and labs stable, CRP normal, prealbumin normal at 20, ESR elevated at 56. History of MRSA and group B strep on prior wound cultures.  MRI with acute osteomyelitis of first metatarsal and both hallux sesamoids, also noted and nondisplaced pathologic fracture of first metatarsal neck.  Podiatry took him to the OR s/p incision and drainage and bone debridement, cultures were sent.    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 Cultures were sent.  Preliminary blood cultures negative. History of MRSA and group B strep on prior cultures from the wound. -Continue current broad-spectrum antibiotics -Follow-up cultures -Supportive care and pain management -PT/OT evaluation   Uncontrolled type 2 diabetes mellitus with hyperglycemia, with long-term current use of insulin (HCC) Last A1C 10/2022 around 12  [facility]I  A1C  Diabetic educator    Essential hypertension BP stable  Titrate home regimen    HLD  (hyperlipidemia) Statin     Subjective: Patient was seen after the procedure today.  No new concern.  Wife at bedside.  Physical Exam: Vitals:   12/10/22 1016 12/10/22 1030 12/10/22 1045 12/10/22 1113  BP: (!) 119/59 111/63 105/62 104/70  Pulse: 65 68 61 61  Resp: 14 15 14 15   Temp: 98.3 F (36.8 C)  (!) 96.9 F (36.1 C) 97.6 F (36.4 C)  TempSrc: Temporal   Oral  SpO2: 98% 94% 93% 96%  Weight:       General.  Well-developed elderly man, in no acute distress. Pulmonary.  Lungs clear bilaterally, normal respiratory effort. CV.  Regular rate and rhythm, no JVD, rub or murmur. Abdomen.  Soft, nontender, nondistended, BS positive. CNS.  Alert and oriented .  No focal neurologic deficit. Extremities.  No edema, pulses intact and symmetrical.  Right foot with transmetatarsal amputation and left foot with bandage and Ace wrap. Psychiatry.  Judgment and insight appears normal.   Data Reviewed: Prior data reviewed  Family Communication: Discussed with wife at bedside  Disposition: Status is: Inpatient Remains inpatient appropriate because: Severity of illness  Planned Discharge Destination: Home with Home Health  DVT prophylaxis.  Lovenox Time spent: 45 minutes  This record has been created using Conservation officer, historic buildings. Errors have been sought and corrected,but may not always be located. Such creation errors do not reflect on the standard of care.   Author: Arnetha Courser, MD 12/10/2022 2:00 PM  For on call review [email or web address].

## 2022-12-10 NOTE — Transfer of Care (Signed)
Immediate Anesthesia Transfer of Care Note  Patient: Jared Castillo  Procedure(s) Performed: LEFT FOOT SOFT TISSUE AND BONE DEBRIDEMENT BONE (Left: Foot)  Patient Location: PACU  Anesthesia Type:General  Level of Consciousness: awake, alert , and oriented  Airway & Oxygen Therapy: Patient Spontanous Breathing and Patient connected to face mask oxygen  Post-op Assessment: Report given to RN and Post -op Vital signs reviewed and stable  Post vital signs: Reviewed and stable  Last Vitals:  Vitals Value Taken Time  BP 119/59 12/10/22 1016  Temp 36.8 C 12/10/22 1016  Pulse 63 12/10/22 1016  Resp 14 12/10/22 1016  SpO2 100 % 12/10/22 1016  Vitals shown include unfiled device data.  Last Pain:  Vitals:   12/10/22 1016  TempSrc: Temporal  PainSc:       Patients Stated Pain Goal: 0 (12/10/22 0513)  Complications: No notable events documented.

## 2022-12-10 NOTE — Interval H&P Note (Signed)
History and Physical Interval Note:  12/10/2022 8:58 AM  Jared Castillo  has presented today for surgery, with the diagnosis of LEFT FOOT OSTEOMYELITIS.  The various methods of treatment have been discussed with the patient and family. After consideration of risks, benefits and other options for treatment, the patient has consented to  Procedure(s): TRANSMETATARSAL AMPUTATION,  DEBRIDEMENT BONE VERSUS TMA (Left) as a surgical intervention.  The patient's history has been reviewed, patient examined, no change in status, stable for surgery.  I have reviewed the patient's chart and labs.  Questions were answered to the patient's satisfaction.     Ricci Barker

## 2022-12-10 NOTE — Anesthesia Postprocedure Evaluation (Signed)
Anesthesia Post Note  Patient: Jared Castillo  Procedure(s) Performed: LEFT FOOT SOFT TISSUE AND BONE DEBRIDEMENT BONE (Left: Foot)  Patient location during evaluation: PACU Anesthesia Type: General Level of consciousness: awake and alert Pain management: pain level controlled Vital Signs Assessment: post-procedure vital signs reviewed and stable Respiratory status: spontaneous breathing, nonlabored ventilation, respiratory function stable and patient connected to nasal cannula oxygen Cardiovascular status: blood pressure returned to baseline and stable Postop Assessment: no apparent nausea or vomiting Anesthetic complications: no   No notable events documented.   Last Vitals:  Vitals:   12/10/22 1045 12/10/22 1113  BP: 105/62 104/70  Pulse: 61 61  Resp: 14 15  Temp: (!) 36.1 C 36.4 C  SpO2: 93% 96%    Last Pain:  Vitals:   12/10/22 1113  TempSrc: Oral  PainSc: 0-No pain                 Corinda Gubler

## 2022-12-11 DIAGNOSIS — E785 Hyperlipidemia, unspecified: Secondary | ICD-10-CM

## 2022-12-11 DIAGNOSIS — I10 Essential (primary) hypertension: Secondary | ICD-10-CM

## 2022-12-11 DIAGNOSIS — E1165 Type 2 diabetes mellitus with hyperglycemia: Secondary | ICD-10-CM

## 2022-12-11 DIAGNOSIS — M86172 Other acute osteomyelitis, left ankle and foot: Secondary | ICD-10-CM

## 2022-12-11 DIAGNOSIS — Z794 Long term (current) use of insulin: Secondary | ICD-10-CM

## 2022-12-11 LAB — BASIC METABOLIC PANEL
Anion gap: 7 (ref 5–15)
BUN: 11 mg/dL (ref 8–23)
CO2: 27 mmol/L (ref 22–32)
Calcium: 8.7 mg/dL — ABNORMAL LOW (ref 8.9–10.3)
Chloride: 99 mmol/L (ref 98–111)
Creatinine, Ser: 0.85 mg/dL (ref 0.61–1.24)
GFR, Estimated: >60 mL/min (ref >60)
Glucose, Bld: 243 mg/dL — ABNORMAL HIGH (ref 70–99)
Potassium: 4 mmol/L (ref 3.5–5.1)
Sodium: 133 mmol/L — ABNORMAL LOW (ref 135–145)

## 2022-12-11 LAB — HEMOGLOBIN A1C
Hgb A1c MFr Bld: 11.7 % — ABNORMAL HIGH (ref 4.8–5.6)
Mean Plasma Glucose: 289.09 mg/dL

## 2022-12-11 LAB — GLUCOSE, CAPILLARY
Glucose-Capillary: 209 mg/dL — ABNORMAL HIGH (ref 70–99)
Glucose-Capillary: 250 mg/dL — ABNORMAL HIGH (ref 70–99)
Glucose-Capillary: 327 mg/dL — ABNORMAL HIGH (ref 70–99)
Glucose-Capillary: 237 mg/dL — ABNORMAL HIGH (ref 70–99)

## 2022-12-11 LAB — CBC
HCT: 32.4 % — ABNORMAL LOW (ref 39.0–52.0)
Hemoglobin: 11.1 g/dL — ABNORMAL LOW (ref 13.0–17.0)
MCH: 28.6 pg (ref 26.0–34.0)
MCHC: 34.3 g/dL (ref 30.0–36.0)
MCV: 83.5 fL (ref 80.0–100.0)
Platelets: 237 10*3/uL (ref 150–400)
RBC: 3.88 MIL/uL — ABNORMAL LOW (ref 4.22–5.81)
RDW: 12.7 % (ref 11.5–15.5)
WBC: 6.3 10*3/uL (ref 4.0–10.5)
nRBC: 0 % (ref 0.0–0.2)

## 2022-12-11 MED ORDER — GLUCERNA SHAKE PO LIQD
237.0000 mL | Freq: Two times a day (BID) | ORAL | Status: DC
  Administered 2022-12-11 – 2022-12-12 (×3): 237 mL via ORAL

## 2022-12-11 MED ORDER — METRONIDAZOLE 500 MG PO TABS
500.0000 mg | ORAL_TABLET | Freq: Three times a day (TID) | ORAL | Status: DC
  Administered 2022-12-11 – 2022-12-12 (×3): 500 mg via ORAL
  Filled 2022-12-11 (×3): qty 1

## 2022-12-11 MED ORDER — JUVEN PO PACK
1.0000 | PACK | Freq: Two times a day (BID) | ORAL | Status: DC
  Administered 2022-12-11 – 2022-12-12 (×3): 1 via ORAL

## 2022-12-11 MED ORDER — INSULIN ASPART 100 UNIT/ML IJ SOLN
3.0000 [IU] | Freq: Three times a day (TID) | INTRAMUSCULAR | Status: DC
  Administered 2022-12-11 – 2022-12-12 (×3): 3 [IU] via SUBCUTANEOUS
  Filled 2022-12-11 (×3): qty 1

## 2022-12-11 MED ORDER — ADULT MULTIVITAMIN W/MINERALS CH
1.0000 | ORAL_TABLET | Freq: Every day | ORAL | Status: DC
  Administered 2022-12-11 – 2022-12-12 (×2): 1 via ORAL
  Filled 2022-12-11 (×2): qty 1

## 2022-12-11 NOTE — Plan of Care (Signed)

## 2022-12-11 NOTE — Inpatient Diabetes Management (Signed)
Inpatient Diabetes Program Recommendations  AACE/ADA: New Consensus Statement on Inpatient Glycemic Control (2015)  Target Ranges:  Prepandial:   less than 140 mg/dL      Peak postprandial:   less than 180 mg/dL (1-2 hours)      Critically ill patients:  140 - 180 mg/dL   Lab Results  Component Value Date   GLUCAP 233 (H) 12/10/2022   HGBA1C 11.7 (H) 12/10/2022    Latest Reference Range & Units 12/10/22 08:35 12/10/22 10:22 12/10/22 11:35 12/10/22 16:49 12/10/22 21:43  Glucose-Capillary 70 - 99 mg/dL 161 (H) 096 (H) 045 (H) 305 (H) 233 (H)  (H): Data is abnormally high   Diabetes history: DM2 Outpatient Diabetes medications: Levemir 23 BID, Humalog 0-10 units TID, Metformin 1 gm BID  Current orders for Inpatient glycemic control: Levemir 15 bid, Novolog 0-9 units tid correction  Inpatient Diabetes Program Recommendations:   Please consider: -Add Novolog 3 units tid meal coverage if eats 50%  Thank you, Darel Hong E. Tadarius Maland, RN, MSN, CDE  Diabetes Coordinator Inpatient Glycemic Control Team Team Pager [phone] (8am-5pm) 12/11/2022 7:23 AM

## 2022-12-11 NOTE — Assessment & Plan Note (Signed)
Repeat A1c at 11.7, CBG remained elevated -Adding mealtime coverage of 3 unit -Continue with SSI and Levemir Diabetic educator

## 2022-12-11 NOTE — Consult Note (Signed)
WOC consulted as part of the LE wound order set, podiatry is following the patient and operated on patient 12/10/22, for that reason WOC will not consult on this patient.   Sydna Brodowski [facility], CNS, [employer] [phone]

## 2022-12-11 NOTE — Progress Notes (Signed)
1 Day Post-Op   Subjective/Chief Complaint: Patient seen.  Does relate some significant pain with his left foot.  This is aggravated with walking to the bathroom.   Objective: Vital signs in last 24 hours: Temp:  [96.9 F (36.1 C)-98.3 F (36.8 C)] 97.9 F (36.6 C) (09/08 0910) Pulse Rate:  [55-69] 66 (09/08 0910) Resp:  [14-18] 18 (09/08 0910) BP: (101-119)/(58-70) 105/65 (09/08 0910) SpO2:  [93 %-98 %] 95 % (09/08 0910) Last BM Date : 12/09/22  Intake/Output from previous day: 09/07 0701 - 09/08 0700 In: 2627.8 [P.O.:590; I.V.:500; IV Piggyback:1537.8] Out: 2450 [Urine:2450] Intake/Output this shift: Total I/O In: -  Out: 450 [Urine:450]  Bandage on the left foot is dry and intact.  Upon removal there is some fairly heavy bleeding noted on the bandaging.  Upon removal the incision appears well coapted with wound edges viable.  Lab Results:  Recent Labs    12/10/22 0526 12/11/22 0326  WBC 6.1 6.3  HGB 11.9* 11.1*  HCT 33.7* 32.4*  PLT 277 237   BMET Recent Labs    12/10/22 0526 12/11/22 0326  NA 133* 133*  K 3.8 4.0  CL 98 99  CO2 27 27  GLUCOSE 187* 243*  BUN 9 11  CREATININE 0.84 0.85  CALCIUM 8.9 8.7*   PT/INR No results for input(s): "LABPROT", "INR" in the last 72 hours. ABG No results for input(s): "PHART", "HCO3" in the last 72 hours.  Invalid input(s): "PCO2", "PO2"  Studies/Results: MR FOOT LEFT WO CONTRAST  Result Date: 12/10/2022 CLINICAL DATA:  Diabetic foot wound EXAM: MRI OF THE LEFT FOOT WITHOUT CONTRAST TECHNIQUE: Multiplanar, multisequence MR imaging of the left forefoot was performed. No intravenous contrast was administered. COMPARISON:  MRI 10/08/2022 FINDINGS: Bones/Joint/Cartilage Interval amputation of the great toe at the level of the first MTP joint. Destructive changes of the first metatarsal head. Marked bone marrow edema throughout the first metatarsal extending proximally to the level of the metatarsal base. Confluent low T1  marrow signal changes within the first metatarsal head and diaphysis. Findings are compatible with acute osteomyelitis. There is a nondisplaced pathologic fracture of the first metatarsal neck. Marrow edema and confluent low T1 signal changes within both hallux sesamoids, also compatible with osteomyelitis. Mild bone marrow edema within the adjacent second metatarsal head with preserved T1 marrow signal, favored to be reactive. Remainder of the left forefoot is within normal limits. No additional fractures. No dislocation. Ligaments Intact collateral ligaments. Muscles and Tendons Chronic denervation changes of the foot musculature. No tenosynovitis. Soft tissues Soft tissue wound or ulceration at the distal aspect of the amputation site. Ulceration is contiguous with a fluid collection that surrounds the first metatarsal head and measures up to 3.5 x 2.4 x 3.1 cm. Generalized soft tissue edema of the foot. IMPRESSION: 1. Acute osteomyelitis of the first metatarsal and both hallux sesamoids. 2. Nondisplaced pathologic fracture of the first metatarsal neck. 3. Soft tissue wound or ulceration at the distal aspect of the amputation site. Ulceration is contiguous with a fluid collection that surrounds the first metatarsal head and measures up to 3.5 x 2.4 x 3.1 cm, compatible with abscess. 4. Mild bone marrow edema within the adjacent second metatarsal head, favored to be reactive. Electronically Signed   By: Duanne Guess D.O.   On: 12/10/2022 12:55    Anti-infectives: Anti-infectives (From admission, onward)    Start     Dose/Rate Route Frequency Ordered Stop   12/10/22 1200  vancomycin (VANCOREADY) IVPB 1250 mg/250  mL        1,250 mg 166.7 mL/hr over 90 Minutes Intravenous Every 12 hours 12/09/22 1829     12/10/22 0950  vancomycin (VANCOCIN) powder  Status:  Discontinued          As needed 12/10/22 0959 12/10/22 1013   12/09/22 2000  ceFEPIme (MAXIPIME) 2 g in sodium chloride 0.9 % 100 mL IVPB        2  g 200 mL/hr over 30 Minutes Intravenous Every 8 hours 12/09/22 1822     12/09/22 1930  vancomycin (VANCOREADY) IVPB 2000 mg/400 mL        2,000 mg 200 mL/hr over 120 Minutes Intravenous  Once 12/09/22 1820 12/09/22 2203   12/09/22 1715  ceFEPIme (MAXIPIME) 2 g in sodium chloride 0.9 % 100 mL IVPB  Status:  Discontinued        2 g 200 mL/hr over 30 Minutes Intravenous  Once 12/09/22 1619 12/09/22 1822   12/09/22 1700  metroNIDAZOLE (FLAGYL) IVPB 500 mg        500 mg 100 mL/hr over 60 Minutes Intravenous Every 8 hours 12/09/22 1604 12/16/22 2159       Assessment/Plan: s/p Procedure(s): LEFT FOOT SOFT TISSUE AND BONE DEBRIDEMENT BONE (Left) Assessment: Stable status post debridement left foot.  Plan: Betadine gauze applied to the incision followed by a sterile gauze dressing.  Patient may be partial weightbearing on the left foot wearing his OrthoWedge shoe using walker or crutches with pressure only on the heel.  For now due to the significant bleeding as well as pain would just recommend continuing with bathroom privileges.  May progress to weightbearing as tolerated starting tomorrow.  We will need to wait for pathology to return to determine whether oral or IV antibiotics need to be considered at discharge.  Continue to follow.  LOS: 2 days    Ricci Barker 12/11/2022

## 2022-12-11 NOTE — Progress Notes (Signed)
PHARMACIST - PHYSICIAN COMMUNICATION  CONCERNING: Antibiotic IV to Oral Route Change Policy  RECOMMENDATION: This patient is receiving metronidazole by the intravenous route.  Based on criteria approved by the Pharmacy and Therapeutics Committee, the antibiotic(s) is/are being converted to the equivalent oral dose form(s).  DESCRIPTION: These criteria include: Patient being treated for a respiratory tract infection, urinary tract infection, cellulitis or clostridium difficile associated diarrhea if on metronidazole The patient is not neutropenic and does not exhibit a GI malabsorption state The patient is eating (either orally or via tube) and/or has been taking other orally administered medications for a least 24 hours The patient is improving clinically and has a Tmax < 100.5  If you have questions about this conversion, please contact the Pharmacy Department   Tressie Ellis 12/11/22

## 2022-12-11 NOTE — Progress Notes (Signed)
Initial Nutrition Assessment  DOCUMENTATION CODES:   Not applicable  INTERVENTION:  - Add Glucerna Shake po BID, each supplement provides 220 kcal and 10 grams of protein  - Add -1 packet Juven BID, each packet provides 95 calories, 2.5 grams of protein (collagen), and 9.8 grams of carbohydrate (3 grams sugar); also contains 7 grams of L-arginine and L-glutamine, 300 mg vitamin C, 15 mg vitamin E, 1.2 mcg vitamin B-12, 9.5 mg zinc, 200 mg calcium, and 1.5 g  Calcium Beta-hydroxy-Beta-methylbutyrate to support wound healing  - Add MVI q day.   NUTRITION DIAGNOSIS:   Increased nutrient needs related to wound healing as evidenced by estimated needs.  GOAL:   Patient will meet greater than or equal to 90% of their needs  MONITOR:   PO intake, Supplement acceptance  REASON FOR ASSESSMENT:   Consult Wound healing  ASSESSMENT:   72 y.o. male admits related to L foot osteomyelitis. PMH includes: CKD, DM, GERD, HTN, shingles. Pt is currently receiving medical management related to osteomyelitis.  Meds reviewed:  sliding scale insulin, levemir, flagyl. Labs reviewed: Na low. FS BG 207-323 mg/dL. HgA1c 11.7%.   RD unable to reach pt via phone. Pt is currently POD1 from irrigation and debridement of left foot. Per record, pt ate 100% of his lunch and dinner yesterday. Pt with increased nutrient needs related to wound healing. RD will add Glucerna and Juven for now and continue to monitor PO intakes.   NUTRITION - FOCUSED PHYSICAL EXAM:  Remote assessment.   Diet Order:   Diet Order             Diet Carb Modified Fluid consistency: Thin; Room service appropriate? Yes  Diet effective now                   EDUCATION NEEDS:   Not appropriate for education at this time  Skin:  Skin Assessment: Skin Integrity Issues: Skin Integrity Issues:: Incisions Incisions: L foot; L toe  Last BM:  9/6 - type 5  Height:   Ht Readings from Last 1 Encounters:  10/11/22 6\' 1"  (1.854  m)    Weight:   Wt Readings from Last 1 Encounters:  12/09/22 88.9 kg    Ideal Body Weight:     BMI:  Body mass index is 25.86 kg/m.  Estimated Nutritional Needs:   Kcal:  2500-2800 kcals  Protein:  125-140 gm  Fluid:  >/= 2.5 L  Bethann Humble, RD, LDN, CNSC.

## 2022-12-11 NOTE — Progress Notes (Signed)
Per MD hold metoprolol. Pt's BP is 106/65 and HR is 66.

## 2022-12-11 NOTE — Assessment & Plan Note (Signed)
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 Preliminary blood cultures negative.  Intraoperative cultures are pending History of MRSA and group B strep on prior cultures from the wound. -Continue current broad-spectrum antibiotics -Follow-up cultures -Supportive care and pain management -PT/OT evaluation-recommending home health

## 2022-12-11 NOTE — Evaluation (Signed)
Occupational Therapy Evaluation Patient Details Name: Jared Castillo MRN: [id number] DOB: Aug 26, 1950 Today's Date: 12/11/2022   History of Present Illness Jared Castillo is a 72 y.o. 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  Patient with known history of recurrent diabetic foot infections.  Has had multiple surgeries including recent amputation of the left toe July 2024.  Prior right great and second toe amputation. Pt now s/p I&D of LLE.   Clinical Impression   Pt was seen for OT evaluation this date. Prior to hospital admission, pt was active and independent with ADL management. Pt lives with his spouse in a 1 level home with a ramped entrance. Pt presents to acute OT demonstrating near baseline level of independence with ADL performance and functional mobility. He performs functional mobility and LB dressing in room modified independently. He reports feeling at his functional baseline for all ADL management. Pt educated on safety, falls prevention, and routines modifications to support functional independence. No further skilled OT needs identified at this time. Do not anticipate the need for follow up OT services upon acute hospital DC.        If plan is discharge home, recommend the following:      Functional Status Assessment  Patient has not had a recent decline in their functional status  Equipment Recommendations  None recommended by OT    Recommendations for Other Services       Precautions / Restrictions Precautions Precautions: Fall Precaution Comments: Moderate Restrictions Weight Bearing Restrictions: Yes LLE Weight Bearing:  (Heel WB in post-op shoe)      Mobility Bed Mobility Overal bed mobility: Independent                  Transfers Overall transfer level: Modified independent Equipment used: Rolling walker (2 wheels)                General transfer comment: good use of RW for safety with functional mobility.      Balance Overall balance assessment: No apparent balance deficits (not formally assessed)                                         ADL either performed or assessed with clinical judgement   ADL Overall ADL's : At baseline                                       General ADL Comments: Pt at or near functional baseline. He is able to come to sitting at EOB independently, dons B shoes (post op shoe on LLE) independently. Able to mobilize [facility] distance with a RW and good safety awarness. Reports feeling at or near functional baseline despite being pain limited.     Vision Patient Visual Report: No change from baseline       Perception         Praxis         Pertinent Vitals/Pain Pain Assessment Pain Assessment: 0-10 Pain Score: 9  Pain Location: LLE after mobility Pain Descriptors / Indicators: Aching, Grimacing, Guarding, Sore Pain Intervention(s): Limited activity within patient's tolerance, Monitored during session, Repositioned, Patient requesting pain meds-RN notified     Extremity/Trunk Assessment Upper Extremity Assessment Upper Extremity Assessment: Overall WFL for  tasks assessed   Lower Extremity Assessment Lower Extremity Assessment: Defer to PT evaluation;LLE deficits/detail LLE Deficits / Details: s/p LLE I&D with orders to remain heel WB with post op shoe for all mobility.   Cervical / Trunk Assessment Cervical / Trunk Assessment: Normal   Communication Communication Communication: No apparent difficulties Cueing Techniques: Verbal cues   Cognition Arousal: Alert Behavior During Therapy: WFL for tasks assessed/performed Overall Cognitive Status: Within Functional Limits for tasks assessed                                 General Comments: Pt pleasant, conversationaly, fully oriented.     General Comments  Post-op shoe  donned t/o mobility and at end of session with pt up in recliner.    Exercises Other Exercises Other Exercises: Pt educated on role of OT in acute setting, safety, falls prevention, WB precautions, and post-op shoe use for all mobility. Pt familiar from past surgeries. Reports full compliance with DC instructions in past. Return demos understanding of how to safely don post op shoe during session.   Shoulder Instructions      Home Living Family/patient expects to be discharged to:: Private residence Living Arrangements: Spouse/significant other Available Help at Discharge: Family Type of Home: Mobile home Home Access: Ramped entrance     Home Layout: One level     Bathroom Shower/Tub: Producer, television/film/video: Standard     Home Equipment: Agricultural consultant (2 wheels);Cane - single point          Prior Functioning/Environment Prior Level of Function : Independent/Modified Independent;Working/employed;Driving             Mobility Comments: indep with SPC PRN, driving. ADLs Comments: I with all ADL/IADL management.        OT Problem List: Decreased coordination;Pain;Decreased safety awareness;Impaired balance (sitting and/or standing)      OT Treatment/Interventions:      OT Goals(Current goals can be found in the care plan section) Acute Rehab OT Goals Patient Stated Goal: To go home OT Goal Formulation: All assessment and education complete, DC therapy Time For Goal Achievement: 12/11/22 Potential to Achieve Goals: Good  OT Frequency:      Co-evaluation              AM-PAC OT "6 Clicks" Daily Activity     Outcome Measure Help from another person eating meals?: None Help from another person taking care of personal grooming?: None Help from another person toileting, which includes using toliet, bedpan, or urinal?: None Help from another person bathing (including washing, rinsing, drying)?: None Help from another person to put on and taking off  regular upper body clothing?: None Help from another person to put on and taking off regular lower body clothing?: None 6 Click Score: 24   End of Session Equipment Utilized During Treatment: Gait belt;Rolling walker (2 wheels) Nurse Communication: Mobility status;Patient requests pain meds  Activity Tolerance: Patient tolerated treatment well Patient left: in chair;with call bell/phone within reach  OT Visit Diagnosis: Other abnormalities of gait and mobility (R26.89);Pain Pain - Right/Left: Left Pain - part of body: Ankle and joints of foot                Time: 9563-8756 OT Time Calculation (min): 18 min Charges:  OT General Charges $OT Visit: 1 Visit OT Evaluation $OT Eval Low Complexity: 1 Low OT Treatments $Self Care/Home Management : 8-22 mins  Rockney Ghee, M.S., OTR/L 12/11/22, 10:43 AM

## 2022-12-11 NOTE — Progress Notes (Signed)
Progress Note   Patient: Jared Castillo HYQ:[id number] DOB: Feb 09, 1951 DOA: 12/09/2022     2 DOS: the patient was seen and examined on 12/11/2022   Brief hospital course: Taken from H&P.  Jared Castillo is a 72 y.o. 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  Patient with known history of recurrent diabetic foot infections.  Has had multiple surgeries including recent amputation of the left toe July 2024.  Prior right great and second toe amputation.  Was seen in podiatry clinic today with noted worsening wound follow-up of left foot.  Preliminary imaging concerning for infection per Dr. Ether Griffins. Recommended admission for further evaluation.   On presentation hemodynamically stable, MRI ordered and started on cefepime and vancomycin.  9/7: Vital and labs stable, CRP normal, prealbumin normal at 20, ESR elevated at 56. History of MRSA and group B strep on prior wound cultures.  MRI with acute osteomyelitis of first metatarsal and both hallux sesamoids, also noted and nondisplaced pathologic fracture of first metatarsal neck.  Podiatry took him to the OR s/p incision and drainage and bone debridement, cultures were sent.  9/8: Vitals and labs stable.  A1c of 11.7 and CBG remained elevated-adding 3 units for mealtime coverage.  PT is recommending home health.  Intraoperative pathology and cultures are pending.   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 Preliminary blood cultures negative.  Intraoperative cultures are pending History of MRSA and group B strep on prior cultures from the wound. -Continue current broad-spectrum antibiotics -Follow-up cultures -Supportive care and pain management -PT/OT evaluation-recommending home health   Uncontrolled type  2 diabetes mellitus with hyperglycemia, with long-term current use of insulin (HCC) Repeat A1c at 11.7, CBG remained elevated -Adding mealtime coverage of 3 unit -Continue with SSI and Levemir Diabetic educator    Essential hypertension BP stable  Titrate home regimen    HLD (hyperlipidemia) Statin     Subjective: Patient was having pain in his left foot at surgical site, working with PT.  No other complaints  Physical Exam: Vitals:   12/10/22 2011 12/11/22 0034 12/11/22 0455 12/11/22 0910  BP: 111/68 107/67 110/63 105/65  Pulse: (!) 55 69 67 66  Resp: 18 18 18 18   Temp: 97.6 F (36.4 C) 98.1 F (36.7 C) 98.2 F (36.8 C) 97.9 F (36.6 C)  TempSrc:   Oral   SpO2: 97% 95% 97% 95%  Weight:       General.  Well-developed elderly man, in no acute distress. Pulmonary.  Lungs clear bilaterally, normal respiratory effort. CV.  Regular rate and rhythm, no JVD, rub or murmur. Abdomen.  Soft, nontender, nondistended, BS positive. CNS.  Alert and oriented .  No focal neurologic deficit. Extremities.  No edema, no cyanosis, pulses intact and symmetrical.  Left foot with Ace wrap and bandage Psychiatry.  Judgment and insight appears normal.   Data Reviewed: Prior data reviewed  Family Communication: Discussed with patient  Disposition: Status is: Inpatient Remains inpatient appropriate because: Severity of illness  Planned Discharge Destination: Home with Home Health  DVT prophylaxis.  Lovenox Time spent: 42 minutes  This record has been created using Conservation officer, historic buildings. Errors have been sought and corrected,but may not always be located. Such creation errors do not reflect on the standard of care.   Author: Arnetha Courser, MD 12/11/2022 1:17  PM  For on call review [email or web address].

## 2022-12-11 NOTE — Evaluation (Signed)
Physical Therapy Evaluation Patient Details Name: Jared Castillo MRN: [id number] DOB: October 23, 1950 Today's Date: 12/11/2022  History of Present Illness  Jared Castillo is a 72 y.o. 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  Patient with known history of recurrent diabetic foot infections.  Has had multiple surgeries including recent amputation of the left toe July 2024.  Prior right great and second toe amputation. Pt now s/p I&D of LLE.    Clinical Impression  Pt received in chair at first refused to participate in PT eval due to pain but with minimal encouragement, pt agreeable to participate in therapy. Todays' assessment revealed pt able to transfers Chair to bed and ambulated short distance with FWW with CGA of 1.  STS with sup. Pt Ind with donning and doffing of the Ortho shoe. Pt Ind with bed mobility. Pt verbalized desire to receive therapy and home. PT will continue in acute care and pt will benefit from therapy beyond acute care.  PT referred pt to mobility specialist      If plan is discharge home, recommend the following: A little help with walking and/or transfers;A little help with bathing/dressing/bathroom;Assistance with cooking/housework;Direct supervision/assist for medications management;Help with stairs or ramp for entrance;Assist for transportation   Can travel by private vehicle        Equipment Recommendations None recommended by PT  Recommendations for Other Services       Functional Status Assessment Patient has had a recent decline in their functional status and demonstrates the ability to make significant improvements in function in a reasonable and predictable amount of time.     Precautions / Restrictions Precautions Precautions: Fall Precaution Comments: Moderate Restrictions Weight Bearing Restrictions: Yes LLE Weight Bearing: Weight bearing  as tolerated Other Position/Activity Restrictions: with Toes off loading shoes only.      Mobility  Bed Mobility Overal bed mobility: Independent                  Transfers Overall transfer level: Needs assistance Equipment used: Rolling walker (2 wheels) Transfers: Sit to/from Stand, Bed to chair/wheelchair/BSC Sit to Stand: Supervision   Step pivot transfers: Contact guard assist       General transfer comment: Needs Sip to CGA overall due to increased pain.    Ambulation/Gait Ambulation/Gait assistance: Contact guard assist Gait Distance (Feet): 43 Feet Assistive device: Rolling walker (2 wheels) Gait Pattern/deviations: Step-to pattern Gait velocity: dec     General Gait Details: Ambulated with toe off loading shoe  Stairs: Ramped entrance            Wheelchair Mobility     Tilt Bed    Modified Rankin (Stroke Patients Only)       Balance Overall balance assessment: Needs assistance Sitting-balance support: Feet supported Sitting balance-Leahy Scale: Normal     Standing balance support: Bilateral upper extremity supported Standing balance-Leahy Scale: Good Standing balance comment: Pt at risk of all 2/2 to WB alowed with off loading shoe and in pain and weakness.                             Pertinent Vitals/Pain Pain Assessment Pain Assessment: 0-10 Pain Score: 9  Pain Location: LLE after mobility Pain Descriptors / Indicators: Aching, Grimacing, Guarding, Sore Pain Intervention(s): Patient requesting pain meds-RN notified, Limited activity within patient's tolerance    Home Living Family/patient expects to be  discharged to:: Private residence Living Arrangements: Spouse/significant other Available Help at Discharge: Family Type of Home: Mobile home Home Access: Ramped entrance       Home Layout: One level Home Equipment: Agricultural consultant (2 wheels);Cane - single point;BSC/3in1      Prior Function Prior Level of  Function : Independent/Modified Independent;Working/employed;Driving             Mobility Comments: indep with SPC PRN, driving. ADLs Comments: I with all ADL/IADL management.     Extremity/Trunk Assessment   Upper Extremity Assessment Upper Extremity Assessment: Generalized weakness    Lower Extremity Assessment Lower Extremity Assessment: LLE deficits/detail LLE Deficits / Details: s/p LLE I&D with orders to remain heel WB with post op shoe for all mobility. LLE: Unable to fully assess due to pain    Cervical / Trunk Assessment Cervical / Trunk Assessment: Normal  Communication   Communication Communication: No apparent difficulties Cueing Techniques: Verbal cues  Cognition Arousal: Alert Behavior During Therapy: WFL for tasks assessed/performed Overall Cognitive Status: Within Functional Limits for tasks assessed                                 General Comments: Pt pleasant, conversationaly, fully oriented.        General Comments General comments (skin integrity, edema, etc.): Pt L foot wrapped.    Exercises Other Exercises Other Exercises: Pt educated to perfrom LAQ, Hip Flex and and to get OOB every meal and walk to bathroom for BMs.   Assessment/Plan    PT Assessment Patient needs continued PT services  PT Problem List Decreased strength;Decreased balance;Decreased activity tolerance;Decreased range of motion;Pain       PT Treatment Interventions Gait training;Functional mobility training;Therapeutic activities;Therapeutic exercise;Balance training;Patient/family education    PT Goals (Current goals can be found in the Care Plan section)  Acute Rehab PT Goals Patient Stated Goal: I want to get better." PT Goal Formulation: With patient Time For Goal Achievement: 12/25/22 Potential to Achieve Goals: Fair    Frequency Min 1X/week     Co-evaluation               AM-PAC PT "6 Clicks" Mobility  Outcome Measure Help needed  turning from your back to your side while in a flat bed without using bedrails?: None Help needed moving from lying on your back to sitting on the side of a flat bed without using bedrails?: None Help needed moving to and from a bed to a chair (including a wheelchair)?: A Little Help needed standing up from a chair using your arms (e.g., wheelchair or bedside chair)?: A Little Help needed to walk in hospital room?: A Little Help needed climbing 3-5 steps with a railing? : A Little 6 Click Score: 20    End of Session Equipment Utilized During Treatment: Gait belt Activity Tolerance: Patient limited by pain Patient left: in bed;with bed alarm set;with call bell/phone within reach Nurse Communication: Mobility status PT Visit Diagnosis: Muscle weakness (generalized) (M62.81);Pain;Other abnormalities of gait and mobility (R26.89) Pain - Right/Left: Left Pain - part of body: Ankle and joints of foot    Time: 9528-4132 PT Time Calculation (min) (ACUTE ONLY): 14 min   Charges:   PT Evaluation $PT Eval Low Complexity: 1 Low   PT General Charges $$ ACUTE PT VISIT: 1 Visit         Davionne Mastrangelo PT DPT 12:23 PM,12/11/22

## 2022-12-12 ENCOUNTER — Encounter: Payer: Self-pay | Admitting: Podiatry

## 2022-12-12 LAB — GLUCOSE, CAPILLARY
Glucose-Capillary: 297 mg/dL — ABNORMAL HIGH (ref 70–99)
Glucose-Capillary: 234 mg/dL — ABNORMAL HIGH (ref 70–99)

## 2022-12-12 MED ORDER — ZINC SULFATE 220 (50 ZN) MG PO CAPS: 220.0000 mg | ORAL_CAPSULE | Freq: Every day | ORAL | 0 refills | Status: AC

## 2022-12-12 MED ORDER — INSULIN DETEMIR 100 UNIT/ML ~~LOC~~ SOLN
20.0000 [IU] | Freq: Two times a day (BID) | SUBCUTANEOUS | Status: DC
  Administered 2022-12-12: 20 [IU] via SUBCUTANEOUS
  Filled 2022-12-12 (×2): qty 0.2

## 2022-12-12 MED ORDER — INSULIN ASPART 100 UNIT/ML IJ SOLN: 0.0000 [IU] | Freq: Every day | INTRAMUSCULAR | Status: DC

## 2022-12-12 MED ORDER — ASCORBIC ACID 500 MG PO TABS: 500.0000 mg | ORAL_TABLET | Freq: Two times a day (BID) | ORAL | 1 refills | Status: AC

## 2022-12-12 MED ORDER — INSULIN ASPART 100 UNIT/ML IJ SOLN
5.0000 [IU] | Freq: Three times a day (TID) | INTRAMUSCULAR | Status: DC
  Administered 2022-12-12: 5 [IU] via SUBCUTANEOUS
  Filled 2022-12-12: qty 1

## 2022-12-12 MED ORDER — ZINC SULFATE 220 (50 ZN) MG PO CAPS: 220.0000 mg | ORAL_CAPSULE | Freq: Every day | ORAL | Status: DC

## 2022-12-12 MED ORDER — VITAMIN C 500 MG PO TABS: 500.0000 mg | ORAL_TABLET | Freq: Two times a day (BID) | ORAL | Status: DC

## 2022-12-12 MED ORDER — INSULIN ASPART 100 UNIT/ML IJ SOLN
0.0000 [IU] | Freq: Three times a day (TID) | INTRAMUSCULAR | Status: DC
  Administered 2022-12-12: 5 [IU] via SUBCUTANEOUS
  Filled 2022-12-12: qty 1

## 2022-12-12 MED ORDER — DOXYCYCLINE HYCLATE 100 MG PO TABS: 100.0000 mg | ORAL_TABLET | Freq: Two times a day (BID) | ORAL | 0 refills | Status: AC

## 2022-12-12 MED ORDER — OXYCODONE-ACETAMINOPHEN 5-325 MG PO TABS: 1.0000 | ORAL_TABLET | ORAL | 0 refills | Status: DC | PRN

## 2022-12-12 MED ORDER — GLUCERNA SHAKE PO LIQD: 237.0000 mL | Freq: Two times a day (BID) | ORAL | 2 refills | Status: AC

## 2022-12-12 MED ORDER — CEFADROXIL 500 MG PO CAPS: 500.0000 mg | ORAL_CAPSULE | Freq: Two times a day (BID) | ORAL | 0 refills | Status: AC

## 2022-12-12 NOTE — Care Management Important Message (Signed)
Important Message  Patient Details  Name: Jared Castillo MRN: [id number] Date of Birth: 1950/11/28   Medicare Important Message Given:  N/A - LOS <3 / Initial given by admissions     Olegario Messier A Juleen Sorrels 12/12/2022, 10:34 AM

## 2022-12-12 NOTE — Discharge Instructions (Addendum)
You have an appointment with Dr. Lourdes Sledge [facility] Endocrinology) on 12/19/22 at 2:00 pm. [facility]  [address]  [facility] [address]  [place], [place] [address]  Phone: [phone]    Plate Method for Diabetes   Foods with carbohydrates make your blood glucose level go up. The plate method is a simple way to meal plan and control the amount of carbohydrate you eat.         Use the following guidance to build a healthy plate to control carbohydrates. Divide a 9-inch plate into 3 sections, and consider your beverage the 4th section of your meal: Food Group Examples of Foods/Beverages for This Section of your Meal  Section 1: Non-starchy vegetables Fill  of your plate to include non-starchy vegetables Asparagus, broccoli, brussels sprouts, cabbage, carrots, cauliflower, celery, cucumber, green beans, mushrooms, peppers, salad greens, tomatoes, or zucchini.  Section 2: Protein foods Fill  of your plate to include a lean protein Lean meat, poultry, fish, seafood, cheese, eggs, lean deli meat, tofu, beans, lentils, nuts or nut butters.  Section 3: Carbohydrate foods Fill  of your plate to include carbohydrate foods Whole grains, whole wheat bread, brown rice, whole grain pasta, polenta, corn tortillas, fruit, or starchy vegetables (potatoes, green peas, corn, beans, acorn squash, and butternut squash). One cup of milk also counts as a food that contains carbohydrate.  Section 4: Beverage Choose water or a low-calorie drink for your beverage. Unsweetened tea, coffee, or flavored/sparkling water without added sugar.  Image reprinted with permission from The American Diabetes Association.  Copyright 2022 by the American Diabetes Association.   Copyright 2022  Academy of Nutrition and Dietetics. All rights reserved    Carbohydrate Counting For People With Diabetes  Foods with carbohydrates make your blood glucose level go up. Learning how to count  carbohydrates can help you control your blood glucose levels. First, identify the foods you eat that contain carbohydrates. Then, using the Foods with Carbohydrates chart, determine about how much carbohydrates are in your meals and snacks. Make sure you are eating foods with fiber, protein, and healthy fat along with your carbohydrate foods. Foods with Carbohydrates The following table shows carbohydrate foods that have about 15 grams of carbohydrate each. Using measuring cups, spoons, or a food scale when you first begin learning about carbohydrate counting can help you learn about the portion sizes you typically eat. The following foods have 15 grams carbohydrate each:  Grains 1 slice bread (1 ounce)  1 small tortilla (6-inch size)   large bagel (1 ounce)  1/3 cup pasta or rice (cooked)   hamburger or hot dog bun ( ounce)   cup cooked cereal   to  cup ready-to-eat cereal  2 taco shells (5-inch size) Fruit 1 small fresh fruit ( to 1 cup)   medium banana  17 small grapes (3 ounces)  1 cup melon or berries   cup canned or frozen fruit  2 tablespoons dried fruit (blueberries, cherries, cranberries, raisins)   cup unsweetened fruit juice  Starchy Vegetables  cup cooked beans, peas, corn, potatoes/sweet potatoes   large baked potato (3 ounces)  1 cup acorn or butternut squash  Snack Foods 3 to 6 crackers  8 potato chips or 13 tortilla chips ( ounce to 1 ounce)  3 cups popped popcorn  Dairy 3/4 cup (6 ounces) nonfat plain yogurt, or yogurt with sugar-free sweetener  1 cup milk  1 cup plain rice, soy, coconut or  flavored almond milk Sweets and Desserts  cup ice cream or frozen yogurt  1 tablespoon jam, jelly, pancake syrup, table sugar, or honey  2 tablespoons light pancake syrup  1 inch square of frosted cake or 2 inch square of unfrosted cake  2 small cookies (2/3 ounce each) or  large cookie  Sometimes you'll have to estimate carbohydrate amounts if you don't know the  exact recipe. One cup of mixed foods like soups can have 1 to 2 carbohydrate servings, while some casseroles might have 2 or more servings of carbohydrate. Foods that have less than 20 calories in each serving can be counted as "free" foods. Count 1 cup raw vegetables, or  cup cooked non-starchy vegetables as "free" foods. If you eat 3 or more servings at one meal, then count them as 1 carbohydrate serving.  Foods without Carbohydrates  Not all foods contain carbohydrates. Meat, some dairy, fats, non-starchy vegetables, and many beverages don't contain carbohydrate. So when you count carbohydrates, you can generally exclude chicken, pork, beef, fish, seafood, eggs, tofu, cheese, butter, sour cream, avocado, nuts, seeds, olives, mayonnaise, water, black coffee, unsweetened tea, and zero-calorie drinks. Vegetables with no or low carbohydrate include green beans, cauliflower, tomatoes, and onions. How much carbohydrate should I eat at each meal?  Carbohydrate counting can help you plan your meals and manage your weight. Following are some starting points for carbohydrate intake at each meal. Work with your registered dietitian nutritionist to find the best range that works for your blood glucose and weight.   To Lose Weight To Maintain Weight  Women 2 - 3 carb servings 3 - 4 carb servings  Men 3 - 4 carb servings 4 - 5 carb servings  Checking your blood glucose after meals will help you know if you need to adjust the timing, type, or number of carbohydrate servings in your meal plan. Achieve and keep a healthy body weight by balancing your food intake and physical activity.  Tips How should I plan my meals?  Plan for half the food on your plate to include non-starchy vegetables, like salad greens, broccoli, or carrots. Try to eat 3 to 5 servings of non-starchy vegetables every day. Have a protein food at each meal. Protein foods include chicken, fish, meat, eggs, or beans (note that beans contain  carbohydrate). These two food groups (non-starchy vegetables and proteins) are low in carbohydrate. If you fill up your plate with these foods, you will eat less carbohydrate but still fill up your stomach. Try to limit your carbohydrate portion to  of the plate.  What fats are healthiest to eat?  Diabetes increases risk for heart disease. To help protect your heart, eat more healthy fats, such as olive oil, nuts, and avocado. Eat less saturated fats like butter, cream, and high-fat meats, like bacon and sausage. Avoid trans fats, which are in all foods that list "partially hydrogenated oil" as an ingredient. What should I drink?  Choose drinks that are not sweetened with sugar. The healthiest choices are water, carbonated or seltzer waters, and tea and coffee without added sugars.  Sweet drinks will make your blood glucose go up very quickly. One serving of soda or energy drink is  cup. It is best to drink these beverages only if your blood glucose is low.  Artificially sweetened, or diet drinks, typically do not increase your blood glucose if they have zero calories in them. Read labels of beverages, as some diet drinks do have carbohydrate and will  raise your blood glucose. Label Reading Tips Read Nutrition Facts labels to find out how many grams of carbohydrate are in a food you want to eat. Don't forget: sometimes serving sizes on the label aren't the same as how much food you are going to eat, so you may need to calculate how much carbohydrate is in the food you are serving yourself.   Carbohydrate Counting for People with Diabetes Sample 1-Day Menu  Breakfast  cup yogurt, low fat, low sugar (1 carbohydrate serving)   cup cereal, ready-to-eat, unsweetened (1 carbohydrate serving)  1 cup strawberries (1 carbohydrate serving)   cup almonds ( carbohydrate serving)  Lunch 1, 5 ounce can chunk light tuna  2 ounces cheese, low fat cheddar  6 whole wheat crackers (1 carbohydrate serving)  1  small apple (1 carbohydrate servings)   cup carrots ( carbohydrate serving)   cup snap peas  1 cup 1% milk (1 carbohydrate serving)   Evening Meal Stir fry made with: 3 ounces chicken  1 cup brown rice (3 carbohydrate servings)   cup broccoli ( carbohydrate serving)   cup green beans   cup onions  1 tablespoon olive oil  2 tablespoons teriyaki sauce ( carbohydrate serving)  Evening Snack 1 extra small banana (1 carbohydrate serving)  1 tablespoon peanut butter   Carbohydrate Counting for People with Diabetes Vegan Sample 1-Day Menu  Breakfast 1 cup cooked oatmeal (2 carbohydrate servings)   cup blueberries (1 carbohydrate serving)  2 tablespoons flaxseeds  1 cup soymilk fortified with calcium and vitamin D  1 cup coffee  Lunch 2 slices whole wheat bread (2 carbohydrate servings)   cup baked tofu   cup lettuce  2 slices tomato  2 slices avocado   cup baby carrots ( carbohydrate serving)  1 orange (1 carbohydrate serving)  1 cup soymilk fortified with calcium and vitamin D   Evening Meal Burrito made with: 1 6-inch corn tortilla (1 carbohydrate serving)  1 cup refried vegetarian beans (2 carbohydrate servings)   cup chopped tomatoes   cup lettuce   cup salsa  1/3 cup brown rice (1 carbohydrate serving)  1 tablespoon olive oil for rice   cup zucchini   Evening Snack 6 small whole grain crackers (1 carbohydrate serving)  2 apricots ( carbohydrate serving)   cup unsalted peanuts ( carbohydrate serving)    Carbohydrate Counting for People with Diabetes Vegetarian (Lacto-Ovo) Sample 1-Day Menu  Breakfast 1 cup cooked oatmeal (2 carbohydrate servings)   cup blueberries (1 carbohydrate serving)  2 tablespoons flaxseeds  1 egg  1 cup 1% milk (1 carbohydrate serving)  1 cup coffee  Lunch 2 slices whole wheat bread (2 carbohydrate servings)  2 ounces low-fat cheese   cup lettuce  2 slices tomato  2 slices avocado   cup baby carrots ( carbohydrate  serving)  1 orange (1 carbohydrate serving)  1 cup unsweetened tea  Evening Meal Burrito made with: 1 6-inch corn tortilla (1 carbohydrate serving)   cup refried vegetarian beans (1 carbohydrate serving)   cup tomatoes   cup lettuce   cup salsa  1/3 cup brown rice (1 carbohydrate serving)  1 tablespoon olive oil for rice   cup zucchini  1 cup 1% milk (1 carbohydrate serving)  Evening Snack 6 small whole grain crackers (1 carbohydrate serving)  2 apricots ( carbohydrate serving)   cup unsalted peanuts ( carbohydrate serving)    Copyright 2020  Academy of Nutrition and Dietetics. All rights reserved.  Using Nutrition Labels: Carbohydrate  Serving Size  Look at the serving size. All the information on the label is based on this portion. Servings Per Container  The number of servings contained in the package. Guidelines for Carbohydrate  Look at the total grams of carbohydrate in the serving size.  1 carbohydrate choice = 15 grams of carbohydrate. Range of Carbohydrate Grams Per Choice  Carbohydrate Grams/Choice Carbohydrate Choices  6-10   11-20 1  21-25 1  26-35 2  36-40 2  41-50 3  51-55 3  56-65 4  66-70 4  71-80 5    Copyright 2020  Academy of Nutrition and Dietetics. All rights reserved.    Transportation Resources  Agency Name: [facility] Agency Address: [address] [place], [place] [address] Phone: [phone] Email: [email or web address] .[email or web address] Website: www.alamanceservices.org Service(s) Offered: Housing services, self-sufficiency, congregate meal program, weatherization program, Field seismologist program, emergency food assistance,  housing counseling, home ownership program, [place] program.  Agency Name: [facility] [employer] [phone]) Address: [address] [place], [place] [address] Phone: [phone] Website: [email or web address] Service(s) Offered: Transportation for  [employer], subscription and demand response; Dial-a-Ride for citizens 4 years of age or older.  Agency Name: Department of Social Services Address: [address] [place], [place] [address] Phone: [phone] Service(s) Offered: Child support services; child welfare services; food stamps; Medicaid; work first family assistance; and aid with fuel,  rent, food and medicine, transportation assistance.  Agency Name: Disabled [employer] ([employer]) [employer]  Network Phone: [phone] Service(s) Offered: Transports veterans to the [facility] medical center. Call  forty-eight hours in advance and leave the name, telephone  number, date, and time of appointment. Veteran will be  contacted by the driver the day before the appointment to  arrange a pick up point   [facility]   [phone] To request transportation call between 8 AM and 5 PM Monday- Friday Request must be made by 11 AM at least one day prior to the needed ride

## 2022-12-12 NOTE — Inpatient Diabetes Management (Addendum)
Inpatient Diabetes Program Recommendations  AACE/ADA: New Consensus Statement on Inpatient Glycemic Control  Target Ranges:  Prepandial:   less than 140 mg/dL      Peak postprandial:   less than 180 mg/dL (1-2 hours)      Critically ill patients:  140 - 180 mg/dL    Latest Reference Range & Units 12/11/22 07:48 12/11/22 11:52 12/11/22 17:00 12/11/22 21:16  Glucose-Capillary 70 - 99 mg/dL 962 (H) 952 (H) 841 (H) 237 (H)    Latest Reference Range & Units 02/14/22 17:47 10/08/22 00:06 12/10/22 14:22  Hemoglobin A1C 4.8 - 5.6 % 13.1 (H) 12.4 (H) 11.7 (H)   Review of Glycemic Control  Diabetes history: DM2 Outpatient Diabetes medications: Levemir 23 units BID, Humalog 0-10 units TID with meals, Metformin 1000 mg BID Current orders for Inpatient glycemic control: Levemir 15 units BID, Novolog 3 units TID with meals, Novolog 0-9 units TID with meals  Inpatient Diabetes Program Recommendations:    Insulin: Please consider increasing Levemir to 22 units BID, meal coverage to Novolog 5 units TID with meals, and adding Novolog 0-5 units at bedtime for bedtime correction.  HbgA1C:  A1C 11.7% on 12/10/22 indicating an average glucose of 289 mg/dl over the past 2-3 months.  Would strongly encourage patient to get established with Endocrinology for assistance with getting DM under better control.  NOTE: Patient admitted with osteomyelitis with initial glucose of 277 mg/dl on 06/04/42. Patient was last inpatient 10/07/22-10/12/23 and inpatient diabetes coordinator spoke with patient on 10/10/22 regarding importance of DM control; A1C was noted to be 12/4% on 10/08/22.  Per notes in Care Everywhere, patient seen PCP on 06/20/22 and was prescribed Levemir 38 units BID, Humalog 8 units TID with meals plus correction and was referred to Endocrinology.  Noted telephone note on 07/05/22 in which patient had called to inquire about referral to Endocrinology and was given contact number for Endocrinology. No notes or visits with  Endocrinology since then in Care Everywhere. Would strongly encourage patient to get established with Endocrinology for assistance with getting DM under better control.  Addendum 12/12/22@13 :05-Spoke with patient at bedside about diabetes and home regimen for diabetes control. Patient reports being followed by PCP for diabetes management and was suppose to be referred to Endocrinology by PCP but patient reports he has not been contacted from any Endocrinology office yet.  Patient states he is consistently taking Levemir 23 units BID, Humalog 0-10 units TID with meals, and Metformin 1000 mg BID as an outpatient for diabetes control. Patient reports he wears a FreeStyle Libre CGM sensor and reader device and he reports that his glucose is usually in the 200-300's mg/dl.  Patient reports that he rarely has any issues with hypoglycemia. Inquired about alarms on the [facility] for hyperglycemia and patient reports that he does not receive any alarms on his [employer] reader for hyperglycemia. Patient does not have his FreeStyle Libre reader device here at the hospital but will have his wife bring it to him tomorrow morning. Will plan to follow up with patient in the morning and see if the reader device has alarms on for hyperglycemia and hypoglycemia.   Discussed A1C results (11.7% on 12/10/22) and explained that current A1C indicates an average glucose of 289 mg/dl over the past 2-3 months. Discussed glucose and A1C goals. Discussed importance of checking CBGs and maintaining good CBG control to prevent long-term and short-term complications. Explained how hyperglycemia leads to damage within blood vessels which lead to the common complications  seen with uncontrolled diabetes. Stressed to the patient the importance of improving glycemic control to prevent further complications from uncontrolled diabetes. Discussed impact of nutrition, exercise, stress, sickness, and medications on diabetes control.  Encouraged  patient to reach back out to his PCP office about the referral to Endocrinology. Stressed importance of getting established with an Endocrinologist to get DM under better control and decrease risk of further complications.  Patient verbalized understanding of information discussed and reports no further questions at this time related to diabetes. Per PCP note on 06/20/22, a referral was made to [facility] Endocrinology office; copied [facility] Endocrinology office information from note and added to discharge instructions for patient to call Endocrinology office to get appointment to establish care.  Addendum 12/12/22@13 :55-Called [facility] Endocrinology office regarding getting appointment for patient to establish care. Informed that referral was received and patient's wife was called on 07/22/22 and stated that patient was not going to use an insulin pump so they were not going to pursue seeing an Endocrinologist. Discussed that patient is willing to see an Endocrinologist for assistance with getting DM under better control and asked that the referral be reopened so patient could get an appointment to establish care.  Thanks, Orlando Penner, RN, MSN, CDCES Diabetes Coordinator Inpatient Diabetes Program [phone] (Team Pager from 8am to 5pm)

## 2022-12-12 NOTE — Discharge Summary (Signed)
Physician Discharge Summary   Patient: Jared Castillo MRN: [id number] DOB: 04/26/1950  Admit date:     12/09/2022  Discharge date: 12/12/22  Discharge Physician: Arnetha Courser   PCP: Verner Mould, MD   Recommendations at discharge:  Please obtain CBC and BMP on follow-up Please follow-up on bone pathology results and make changes to his antibiotic duration accordingly. Follow-up with podiatry within a week Follow-up with PCP  Discharge Diagnoses: Principal Problem:   Osteomyelitis (HCC) Active Problems:   Uncontrolled type 2 diabetes mellitus with hyperglycemia, with long-term current use of insulin (HCC)   Essential hypertension   HLD (hyperlipidemia)   Hospital Course: Taken from H&P.  [name] is a 72 y.o. 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  Patient with known history of recurrent diabetic foot infections.  Has had multiple surgeries including recent amputation of the left toe July 2024.  Prior right great and second toe amputation.  Was seen in podiatry clinic today with noted worsening wound follow-up of left foot.  Preliminary imaging concerning for infection per Dr. Ether Griffins. Recommended admission for further evaluation.   On presentation hemodynamically stable, MRI ordered and started on cefepime and vancomycin.  9/7: Vital and labs stable, CRP normal, prealbumin normal at 20, ESR elevated at 56. History of MRSA and group B strep on prior wound cultures.  MRI with acute osteomyelitis of first metatarsal and both hallux sesamoids, also noted and nondisplaced pathologic fracture of first metatarsal neck.  Podiatry took him to the OR s/p incision and drainage and bone debridement, cultures were sent.  9/8: Vitals and labs stable.  A1c of 11.7 and CBG remained elevated-adding 3 units for mealtime coverage.  PT is recommending home health.   Intraoperative pathology and cultures are pending.  9/9: Remained hemodynamically stable.  Bone cultures with group B streptococcus only, no MRSA noted at this time but patient was on Doxy at home.  Bone pathology results are still pending.  Patient is being discharged on cefadroxil and doxycycline for 2 weeks and will follow-up closely with his podiatrist.  If his bone pathology margins are positive then he might need little more prolonged antibiotics, likely up to 6 weeks.  If margins are negative then 2 weeks of antibiotics should be sufficient.  Discussed with his podiatrist and they will follow-up on final culture and pathology results.  Patient will follow-up closely with them as outpatient.  Patient will remain nonweightbearing on left forefoot, should be weightbearing on the heel.  Boot was provided.  Home health services were ordered.  Patient will continue with rest of his home medications and need to have a close follow-up with his providers for further recommendations.  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 Preliminary blood cultures negative.  Intraoperative cultures are pending History of MRSA and group B strep on prior cultures from the wound. -Continue current broad-spectrum antibiotics -Follow-up cultures -Supportive care and pain management -PT/OT evaluation-recommending home health   Uncontrolled type 2 diabetes mellitus with hyperglycemia, with long-term current use of insulin (HCC) Repeat A1c at 11.7, CBG remained elevated -Adding mealtime coverage of 3 unit -Continue with SSI and Levemir Diabetic educator    Essential hypertension BP stable  Titrate home regimen    HLD (hyperlipidemia) Statin        Pain control - [facility]  Controlled Substance Reporting System database was reviewed. and patient was instructed, not to  drive, operate heavy machinery, perform activities at heights, swimming or participation in water activities or provide baby-sitting services while on Pain, Sleep and Anxiety Medications; until their outpatient Physician has advised to do so again. Also recommended to not to take more than prescribed Pain, Sleep and Anxiety Medications.  Consultants: Podiatry Procedures performed: Incision and drainage with bone debridement Disposition: Home health Diet recommendation:  Discharge Diet Orders (From admission, onward)     Start     Ordered   12/12/22 0000  Diet - low sodium heart healthy        12/12/22 1546           Cardiac and Carb modified diet DISCHARGE MEDICATION: Allergies as of 12/12/2022       Reactions   Trazodone Other (See Comments)   Nightmares        Medication List     STOP taking these medications    HYDROcodone-acetaminophen 5-325 MG tablet Commonly known as: NORCO/VICODIN   Saline Flush 0.9 % Soln       TAKE these medications    albuterol 108 (90 Base) MCG/ACT inhaler Commonly known as: VENTOLIN HFA Inhale 1-2 puffs into the lungs every 6 (six) hours as needed for wheezing or shortness of breath.   ascorbic acid 500 MG tablet Commonly known as: VITAMIN C Take 1 tablet (500 mg total) by mouth 2 (two) times daily.   cefadroxil 500 MG capsule Commonly known as: DURICEF Take 1 capsule (500 mg total) by mouth 2 (two) times daily for 14 days.   doxycycline 100 MG tablet Commonly known as: VIBRA-TABS Take 1 tablet (100 mg total) by mouth 2 (two) times daily for 14 days.   feeding supplement (GLUCERNA SHAKE) Liqd Take 237 mLs by mouth 2 (two) times daily between meals. Start taking on: December 13, 2022   fluticasone 27.5 MCG/SPRAY nasal spray Commonly known as: VERAMYST Place 2 sprays into the nose daily.   gabapentin 300 MG capsule Commonly known as: NEURONTIN Take 600 mg by mouth 2 (two) times daily. 2 caps TID   insulin detemir 100  UNIT/ML injection Commonly known as: LEVEMIR Inject 0.23 mLs (23 Units total) into the skin 2 (two) times daily.   insulin lispro 100 UNIT/ML injection Commonly known as: HUMALOG Inject 0-10 Units into the skin 3 (three) times daily as needed for high blood sugar. Sliding scale as needed for BG over 200.   lisinopril-hydrochlorothiazide 20-25 MG tablet Commonly known as: ZESTORETIC Take 0.5 tablets by mouth daily.   metFORMIN 1000 MG tablet Commonly known as: GLUCOPHAGE Take 1,000 mg by mouth 2 (two) times daily with a meal.   metoprolol succinate 50 MG 24 hr tablet Commonly known as: TOPROL-XL Take 50 mg by mouth daily. Take with or immediately following a meal.   multivitamin with minerals Tabs tablet Take 1 tablet by mouth daily.   mupirocin ointment 2 % Commonly known as: BACTROBAN Place 1 Application into the nose 2 (two) times daily.   omeprazole 20 MG capsule Commonly known as: PRILOSEC Take 20 mg by mouth 2 (two) times daily.   oxyCODONE-acetaminophen 5-325 MG tablet Commonly known as: PERCOCET/ROXICET Take 1-2 tablets by mouth every 4 (four) hours as needed for severe pain.   rosuvastatin 40 MG tablet Commonly known as: CRESTOR Take 40 mg by mouth daily.   zinc sulfate 220 (50 Zn) MG capsule Take 1 capsule (220 mg total) by mouth  daily.               Durable Medical Equipment  (From admission, onward)           Start     Ordered   12/12/22 1525  For home use only DME Walker rolling  Once       Question Answer Comment  Walker: With 5 Inch Wheels   Patient needs a walker to treat with the following condition Impaired mobility      12/12/22 1525            Follow-up Information     Linus Galas, DPM. Schedule an appointment as soon as possible for a visit in 1 week(s).   Specialty: Podiatry Contact information: [address] [place] [address] [phone]                Discharge Exam: Filed Weights   12/09/22  1816  Weight: 88.9 kg   General.     In no acute distress. Pulmonary.  Lungs clear bilaterally, normal respiratory effort. CV.  Regular rate and rhythm, no JVD, rub or murmur. Abdomen.  Soft, nontender, nondistended, BS positive. CNS.  Alert and oriented .  No focal neurologic deficit. Extremities.  No edema, no cyanosis, pulses intact and symmetrical.  Left foot with Ace wrap Psychiatry.  Judgment and insight appears normal.   Condition at discharge: stable  The results of significant diagnostics from this hospitalization (including imaging, microbiology, ancillary and laboratory) are listed below for reference.   Imaging Studies: MR FOOT LEFT WO CONTRAST  Result Date: 12/10/2022 CLINICAL DATA:  Diabetic foot wound EXAM: MRI OF THE LEFT FOOT WITHOUT CONTRAST TECHNIQUE: Multiplanar, multisequence MR imaging of the left forefoot was performed. No intravenous contrast was administered. COMPARISON:  MRI 10/08/2022 FINDINGS: Bones/Joint/Cartilage Interval amputation of the great toe at the level of the first MTP joint. Destructive changes of the first metatarsal head. Marked bone marrow edema throughout the first metatarsal extending proximally to the level of the metatarsal base. Confluent low T1 marrow signal changes within the first metatarsal head and diaphysis. Findings are compatible with acute osteomyelitis. There is a nondisplaced pathologic fracture of the first metatarsal neck. Marrow edema and confluent low T1 signal changes within both hallux sesamoids, also compatible with osteomyelitis. Mild bone marrow edema within the adjacent second metatarsal head with preserved T1 marrow signal, favored to be reactive. Remainder of the left forefoot is within normal limits. No additional fractures. No dislocation. Ligaments Intact collateral ligaments. Muscles and Tendons Chronic denervation changes of the foot musculature. No tenosynovitis. Soft tissues Soft tissue wound or ulceration at the distal  aspect of the amputation site. Ulceration is contiguous with a fluid collection that surrounds the first metatarsal head and measures up to 3.5 x 2.4 x 3.1 cm. Generalized soft tissue edema of the foot. IMPRESSION: 1. Acute osteomyelitis of the first metatarsal and both hallux sesamoids. 2. Nondisplaced pathologic fracture of the first metatarsal neck. 3. Soft tissue wound or ulceration at the distal aspect of the amputation site. Ulceration is contiguous with a fluid collection that surrounds the first metatarsal head and measures up to 3.5 x 2.4 x 3.1 cm, compatible with abscess. 4. Mild bone marrow edema within the adjacent second metatarsal head, favored to be reactive. Electronically Signed   By: Duanne Guess D.O.   On: 12/10/2022 12:55    Microbiology: Results for orders placed or performed during the hospital encounter of 12/09/22  Surgical pcr screen  Status: None   Collection Time: 12/09/22  9:00 PM   Specimen: Nasal Mucosa; Nasal Swab  Result Value Ref Range Status   MRSA, PCR NEGATIVE NEGATIVE Final   Staphylococcus aureus NEGATIVE NEGATIVE Final    Comment: (NOTE) The Xpert SA Assay (FDA approved for NASAL specimens in patients 84 years of age and older), is one component of a comprehensive surveillance program. It is not intended to diagnose infection nor to guide or monitor treatment. Performed at [facility], [address] [place], [place] [address]   Aerobic/Anaerobic Culture w Gram Stain (surgical/deep wound)     Status: None (Preliminary result)   Collection Time: 12/10/22  9:25 AM   Specimen: Foot, Left; Abscess  Result Value Ref Range Status   Specimen Description   Final    FOOT Performed at [facility], [address], [place], [place] [address]    Special Requests   Final    LEFT FOOT DEEP ABCSESS Performed at [facility], [address] [place], [place] [address]    Gram Stain   Final    RARE WBC PRESENT, PREDOMINANTLY  PMN RARE GRAM POSITIVE COCCI Performed at [facility], [address] [place], [place] [address]    Culture   Final    RARE GROUP B STREP(S.AGALACTIAE)ISOLATED TESTING AGAINST S. AGALACTIAE NOT ROUTINELY PERFORMED DUE TO PREDICTABILITY OF AMP/PEN/VAN SUSCEPTIBILITY. NO ANAEROBES ISOLATED; CULTURE IN PROGRESS FOR 5 DAYS    Report Status PENDING  Incomplete  Aerobic/Anaerobic Culture w Gram Stain (surgical/deep wound)     Status: None (Preliminary result)   Collection Time: 12/10/22  9:34 AM   Specimen: Bone; Tissue  Result Value Ref Range Status   Specimen Description   Final    BONE Performed at [facility], [address], [place], [place] [address]    Special Requests   Final    LEFT FIRST METATARSAL BONE Performed at [facility], [address] [place], [place] [address]    Gram Stain   Final    RARE WBC PRESENT, PREDOMINANTLY PMN NO ORGANISMS SEEN Performed at [facility], [address] [place], [place] [address]    Culture   Final    RARE GROUP B STREP(S.AGALACTIAE)ISOLATED TESTING AGAINST S. AGALACTIAE NOT ROUTINELY PERFORMED DUE TO PREDICTABILITY OF AMP/PEN/VAN SUSCEPTIBILITY. NO ANAEROBES ISOLATED; CULTURE IN PROGRESS FOR 5 DAYS    Report Status PENDING  Incomplete    Labs: CBC: Recent Labs  Lab 12/09/22 1632 12/10/22 0526 12/11/22 0326  WBC 8.4 6.1 6.3  HGB 11.4* 11.9* 11.1*  HCT 32.5* 33.7* 32.4*  MCV 81.9 82.6 83.5  PLT 306 277 237   Basic Metabolic Panel: Recent Labs  Lab 12/09/22 1632 12/10/22 0526 12/11/22 0326  NA 132* 133* 133*  K 3.8 3.8 4.0  CL 95* 98 99  CO2 28 27 27   GLUCOSE 277* 187* 243*  BUN 10 9 11   CREATININE 0.83 0.84 0.85  CALCIUM 8.9 8.9 8.7*   Liver Function Tests: Recent Labs  Lab 12/09/22 1632 12/10/22 0526  AST 14* 13*  ALT 14 12  ALKPHOS 116 80  BILITOT 0.3 0.4  PROT 6.9 6.5  ALBUMIN 3.1* 2.7*   CBG: Recent Labs  Lab 12/11/22 1152 12/11/22 1700 12/11/22 2116  12/12/22 0730 12/12/22 1127  GLUCAP 250* 327* 237* 297* 234*    Discharge time spent: greater than 30 minutes.  This record has been created using Conservation officer, historic buildings. Errors have been sought  and corrected,but may not always be located. Such creation errors do not reflect on the standard of care.   Signed: Arnetha Courser, MD [facility] 12/12/2022

## 2022-12-12 NOTE — Progress Notes (Incomplete)
Pharmacy Antibiotic Note  Jared Castillo is a 72 y.o. male with medical history significant of poorly controlled type 2 diabetes, hypertension, hyperlipidemia, CKD presenting with concern for left foot osteomyelitis admitted on 12/09/2022. Pharmacy has been consulted for cefepime and vancomycin dosing.  Plan: Today, 12/12/2022: -Continue with Vancomycin 1250 mg IV Q 12 hrs. (Expected AUC: 493.3, SCr used: 0.85, Cmin 13.9)   Weight: 88.9 kg (196 lb)  Temp (24hrs), Avg:98.3 F (36.8 C), Min:98.1 F (36.7 C), Max:98.5 F (36.9 C)  Recent Labs  Lab 12/09/22 1632 12/10/22 0526 12/11/22 0326  WBC 8.4 6.1 6.3  CREATININE 0.83 0.84 0.85    Estimated Creatinine Clearance: 90.1 mL/min (by C-G formula based on SCr of 0.85 mg/dL).    Allergies  Allergen Reactions   Trazodone Other (See Comments)    Nightmares    Antimicrobials this admission: Cefepime 9/6 >>   Vancomycin  9/6 >>   Metronidazole 9/6>>9/8  Dose adjustments this admission:    Microbiology results: Wound Cx: Rare group B strep.       Thank you for allowing pharmacy to be a part of this patient's care.  Erasmo Leventhal, PharmD Candidate 2025 12/12/2022 9:29 AM

## 2022-12-12 NOTE — TOC Progression Note (Signed)
Transition of Care [facility]) - Progression Note    Patient Details  Name: Jared Castillo MRN: [id number] Date of Birth: [birth year]  Transition of [facility]) CM/SW Contact  Marlowe Sax, RN Phone Number: 12/12/2022, 3:20 PM  Clinical Narrative:    Spoke with the patient, he stated that his walker is 40-71 years old and rusty and missing parts, he will need a new one, He lives at home with his wife He requested transportation information for later, I added resources to the AVS, he is agreeable to [facility] and I notified Barbara Cower with [facility] His wife provides transportation home  Expected Discharge Plan: Home w Home Health Services Barriers to Discharge: No Barriers Identified  Expected Discharge Plan and Services   Discharge Planning Services: CM Consult   Living arrangements for the past 2 months: Single Family Home                 DME Arranged: Walker rolling DME Agency: [employer] Date DME Agency Contacted: 12/12/22 Time DME Agency Contacted: [phone] Representative spoke with at DME Agency: jon HH Arranged: PT, OT HH Agency: [facility] Date HH Agency Contacted: 12/12/22 Time HH Agency Contacted: 1519 Representative spoke with at [facility] Agency: Barbara Cower   Social Determinants of Health (SDOH) Interventions SDOH Screenings   Food Insecurity: No Food Insecurity (12/09/2022)  Recent Concern: Food Insecurity - Food Insecurity Present (10/07/2022)  Housing: Low Risk  (12/09/2022)  Transportation Needs: No Transportation Needs (12/09/2022)  Utilities: Not At Risk (12/09/2022)  Recent Concern: Utilities - At Risk (10/07/2022)  Tobacco Use: Medium Risk (12/09/2022)   Received from [facility]  Health Literacy: Low Risk  (06/11/2021)   Received from [facility]    Readmission Risk Interventions     No data to display

## 2022-12-12 NOTE — Progress Notes (Signed)
2 Days Post-Op   Subjective/Chief Complaint: Patient seen.  Still having some significant pain in the left foot but better than yesterday   Objective: Vital signs in last 24 hours: Temp:  [98.1 F (36.7 C)-98.5 F (36.9 C)] 98.5 F (36.9 C) (09/09 0800) Pulse Rate:  [75-83] 81 (09/09 0800) Resp:  [18-20] 20 (09/09 0103) BP: (106-127)/(63-76) 123/63 (09/09 0800) SpO2:  [96 %-97 %] 96 % (09/09 0800) Last BM Date : 12/10/22  Intake/Output from previous day: 09/08 0701 - 09/09 0700 In: 590 [P.O.:240; IV Piggyback:350] Out: 1950 [Urine:1950] Intake/Output this shift: Total I/O In: -  Out: 750 [Urine:750]  Moderate bleeding with some mild drainage noted on the bandaging on the left foot.  Drainage is consistent with the placement of antibiotic beads.  No signs of purulence.  Incision is well coapted with improving erythema and edema.  Lab Results:  Recent Labs    12/10/22 0526 12/11/22 0326  WBC 6.1 6.3  HGB 11.9* 11.1*  HCT 33.7* 32.4*  PLT 277 237   BMET Recent Labs    12/10/22 0526 12/11/22 0326  NA 133* 133*  K 3.8 4.0  CL 98 99  CO2 27 27  GLUCOSE 187* 243*  BUN 9 11  CREATININE 0.84 0.85  CALCIUM 8.9 8.7*   PT/INR No results for input(s): "LABPROT", "INR" in the last 72 hours. ABG No results for input(s): "PHART", "HCO3" in the last 72 hours.  Invalid input(s): "PCO2", "PO2"  Studies/Results: No results found.  Anti-infectives: Anti-infectives (From admission, onward)    Start     Dose/Rate Route Frequency Ordered Stop   12/11/22 1400  metroNIDAZOLE (FLAGYL) tablet 500 mg  Status:  Discontinued        500 mg Oral Every 8 hours 12/11/22 0959 12/12/22 0959   12/10/22 1200  vancomycin (VANCOREADY) IVPB 1250 mg/250 mL        1,250 mg 166.7 mL/hr over 90 Minutes Intravenous Every 12 hours 12/09/22 1829     12/10/22 0950  vancomycin (VANCOCIN) powder  Status:  Discontinued          As needed 12/10/22 0959 12/10/22 1013   12/09/22 2000  ceFEPIme  (MAXIPIME) 2 g in sodium chloride 0.9 % 100 mL IVPB  Status:  Discontinued        2 g 200 mL/hr over 30 Minutes Intravenous Every 8 hours 12/09/22 1822 12/12/22 0959   12/09/22 1930  vancomycin (VANCOREADY) IVPB 2000 mg/400 mL        2,000 mg 200 mL/hr over 120 Minutes Intravenous  Once 12/09/22 1820 12/09/22 2203   12/09/22 1715  ceFEPIme (MAXIPIME) 2 g in sodium chloride 0.9 % 100 mL IVPB  Status:  Discontinued        2 g 200 mL/hr over 30 Minutes Intravenous  Once 12/09/22 1619 12/09/22 1822   12/09/22 1700  metroNIDAZOLE (FLAGYL) IVPB 500 mg  Status:  Discontinued        500 mg 100 mL/hr over 60 Minutes Intravenous Every 8 hours 12/09/22 1604 12/11/22 0959       Assessment/Plan: s/p Procedure(s): LEFT FOOT SOFT TISSUE AND BONE DEBRIDEMENT BONE (Left) Assessment: Stable status post debridement left foot.  Plan: Betadine gauze and sterile dressing reapplied to the left foot.  Do not see that any pathology has been received yet at this point.  Would recommend evaluation for clean margins as he may require IV antibiotics at discharge if still involved.  Discussed with the patient and at this point I  think he could have the bandage changed every couple of days.  May benefit from home health care evaluating periodically.  Patient may be weightbearing as tolerated with wedge shoe on the left foot with pressure only on the heel using crutches or walker.  Should be stable for discharge from podiatry standpoint pending pathology results.  If involved may need to get infectious disease on board.  LOS: 3 days    Ricci Barker 12/12/2022

## 2022-12-12 NOTE — Plan of Care (Signed)

## 2022-12-12 NOTE — Progress Notes (Signed)
Nutrition Follow-up  DOCUMENTATION CODES:   Not applicable  INTERVENTION:   -MVI with minerals daily -500 mg vitamin C BID -220 mg zinc sulfate daily x 14 days -Continue 1 packet Juven BID, each packet provides 95 calories, 2.5 grams of protein (collagen), and 9.8 grams of carbohydrate (3 grams sugar); also contains 7 grams of L-arginine and L-glutamine, 300 mg vitamin C, 15 mg vitamin E, 1.2 mcg vitamin B-12, 9.5 mg zinc, 200 mg calcium, and 1.5 g  Calcium Beta-hydroxy-Beta-methylbutyrate to support wound healing  -Continue Glucerna Shake po BID, each supplement provides 220 kcal and 10 grams of protein  -Double protein portions with meals -Educated pt on carb modified diet; provided "Plate Method" and "Carbohydrate Counting for People with Diabetes" handouts from [facility] Nutrition Care Manual and attached to AVS? Discharge summary   NUTRITION DIAGNOSIS:   Increased nutrient needs related to wound healing as evidenced by estimated needs.  Ongoing  GOAL:   Patient will meet greater than or equal to 90% of their needs  Progressing   MONITOR:   PO intake, Supplement acceptance  REASON FOR ASSESSMENT:   Consult Wound healing  ASSESSMENT:   72 y.o. male admits related to L foot osteomyelitis. PMH includes: CKD, DM, GERD, HTN, shingles. Pt is currently receiving medical management related to osteomyelitis.  9/7- s/p I&D left foot soft tissue and bone.   Reviewed I/O's: -1.4 L x 24 hours and -3.6 L since admission  UOP: 2 L x 24 hours  Spoke with pt at bedside, who was pleasant and in good spirits today. Pt reports good appetite, consumed all of his breakfast this morning. Noted meal completions 40-100%.   Pt reports he usually consumes 2-3 meals per day, consists of meat and vegetables. Pt has been focusing on eating more healthfully secondary to help with post-op healing. He also snacks on fruit cups throughout the day.    Pt denies any weight loss. Wt has been stable  over the past 2 months.   Discussed importance of good meal and supplement intake to promote healing. Also discussed ways to to help improve glycemic control. Pt amenable to continue supplements.   Per TOC notes, pt will likely discharge today or tomorrow.   Labs reviewed: CBGS: 209-327 (inpatient orders for glycemic control are 0-15 units insulin aspart TID with meals, 0-5 units insulin at bedtime, 5 units insulin aspart TID with meals, and 20 units insulin determir BID).    NUTRITION - FOCUSED PHYSICAL EXAM:  Flowsheet Row Most Recent Value  Orbital Region Moderate depletion  Upper Arm Region No depletion  Thoracic and Lumbar Region No depletion  Buccal Region No depletion  Temple Region Mild depletion  Clavicle Bone Region Moderate depletion  Clavicle and Acromion Bone Region Moderate depletion  Scapular Bone Region Moderate depletion  Dorsal Hand No depletion  Patellar Region No depletion  Anterior Thigh Region No depletion  Posterior Calf Region No depletion  Edema (RD Assessment) Mild  Hair Reviewed  Eyes Reviewed  Mouth Reviewed  Skin Reviewed  Nails Reviewed       Diet Order:   Diet Order             Diet Carb Modified Fluid consistency: Thin; Room service appropriate? Yes  Diet effective now                   EDUCATION NEEDS:   Not appropriate for education at this time  Skin:  Skin Assessment: Skin Integrity Issues: Skin Integrity Issues:: Incisions  Incisions: L foot; L toe  Last BM:  12/10/22  Height:   Ht Readings from Last 1 Encounters:  10/11/22 6\' 1"  (1.854 m)    Weight:   Wt Readings from Last 1 Encounters:  12/09/22 88.9 kg    Ideal Body Weight:  83.6 kg  BMI:  Body mass index is 25.86 kg/m.  Estimated Nutritional Needs:   Kcal:  2300-2500  Protein:  115-130 grams  Fluid:  > 2 L    Levada Schilling, RD, LDN, CDCES Registered Dietitian II Certified Diabetes Care and Education Specialist Please refer to [facility] for RD and/or RD  on-call/weekend/after hours pager

## 2022-12-13 ENCOUNTER — Telehealth: Payer: Self-pay | Admitting: *Deleted

## 2022-12-15 LAB — AEROBIC/ANAEROBIC CULTURE W GRAM STAIN (SURGICAL/DEEP WOUND)

## 2023-09-05 NOTE — Progress Notes (Signed)
 [facility] Patient Clinic Note  Assessment/Plan: Jared Castillo is a 73 y.o.male  Assessment & Plan Type 2 diabetes mellitus with hyperglycemia, with long-term current use of insulin   Diabetes management is suboptimal with an A1c of 12.7%. Currently using Lantus  38 units BID and Humalog  on a sliding scale, but only does it 3-4 times a week because his blood sugars are generally less than 200 when he checks.  Will start using humalog  with each meal, 4 units plus SSI.   Orders: .  POCT glycosylated hemoglobin (Hb A1C) .  metFORMIN  (GLUCOPHAGE ) 1000 MG tablet; Take 1 tablet (1,000 mg total) by mouth in the morning and 1 tablet (1,000 mg total) in the evening. Take with meals. .  gabapentin  (NEURONTIN ) 300 MG capsule; Take 2 capsules (600 mg total) by mouth Three (3) times a day. .  Comprehensive Metabolic Panel; Future .  Protein/Creatinine Ratio, Urine; Future  Hypertension associated with diabetes  BP well controlled with lisinopril -hydrochlorothiazide  10-12.5 mg daily.        Subjective Jared Castillo is a 73 y.o. male  coming to clinic today for the following issues:  Chief Complaint  Patient presents with  . Hypertension    Patient reports taking medications as directed and no new concerns   . Diabetes    Patient reports he has no insurance and his free style diabetic supplies are expensive    HPI: History of Present Illness Jared Castillo is a 73 year old male with diabetes who presents for a follow-up visit.  He is experiencing financial difficulties due to a significant increase in the cost of his Freestyle glucose monitoring system from $75 to $519, which he cannot afford as he has been out of work since January and lacks insurance. He is currently awaiting a response regarding his Medicaid application.  He uses Lantus  insulin  at a dose of 38 units twice a day and Humalog  insulin  as needed when his blood sugar exceeds 200 mg/dL. He monitors his blood sugar  three times daily: in the morning, at lunchtime, and before bed. His morning blood sugar readings typically range between 110 and 120 mg/dL, and he uses Humalog  two to three times a week.  He also takes metformin  1000 mg twice a day and gabapentin  600 mg three times a day. He mentions that his supplies of metformin  and gabapentin  are running low.  He has been out of work since January due to falls at home, which have necessitated the use of a cane. 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 He is waiting for medical clearance to return to work. Diabetes: -   I have reviewed the problem list, medications, and allergies and have updated/reconciled them if needed.   Objective  VITALS: BP 121/77 (BP Site: L Arm, BP Position: Sitting)   Pulse 65   Temp 36.9 C (98.5 F) (Temporal)   Resp 18   Wt 91.9 kg (202 lb 8 oz)   SpO2 97%   BMI 27.43 kg/m   Physical Exam Constitutional:      Appearance: Normal appearance.  HENT:     Head: Normocephalic and atraumatic.  Cardiovascular:     Rate and Rhythm: Normal rate and regular rhythm.     Pulses: Normal pulses.     Heart sounds: Normal heart sounds.  Pulmonary:     Effort: Pulmonary effort is normal.     Breath sounds: Normal  breath sounds.  Musculoskeletal:     Right lower leg: No edema.     Left lower leg: No edema.  Skin:    General: Skin is warm and dry.  Neurological:     General: No focal deficit present.     Mental Status: He is alert and oriented to person, place, and time.  Psychiatric:        Mood and Affect: Mood normal.        Behavior: Behavior normal.        Thought Content: Thought content normal.        Judgment: Judgment normal.       LABS/IMAGING I have reviewed pertinent recent labs and imaging in [facility] at [facility] [address], [address], [place], [place] [address] . Telephone [phone] . Fax [phone]

## 2023-12-05 NOTE — Telephone Encounter (Signed)
 Patient is requesting the following refill Requested Prescriptions   Pending Prescriptions Disp Refills  . rosuvastatin  (CRESTOR ) 40 MG tablet [Pharmacy Med Name: Rosuvastatin  Calcium  40 MG Oral Tablet] 90 tablet 0    Sig: Take 1 tablet by mouth once daily    Recent Visits Date Type Provider Dept  09/05/23 Office Visit Betti Asberry LABOR, MD [facility]  05/09/23 Office Visit Betti, Asberry LABOR, MD [facility]  01/10/23 Office Visit Betti, Asberry LABOR, MD [facility]  Showing recent visits within past 365 days and meeting all other requirements Future Appointments Date Type Provider Dept  12/26/23 Appointment Betti Asberry LABOR, MD [facility]  Showing future appointments within next 365 days and meeting all other requirements    Labs: CBC:  WBC (10*9/L)  Date Value  05/09/2023 7.4   HGB (g/dL)  Date Value  97/95/7974 14.2   HCT (%)  Date Value  05/09/2023 41.5   MCV (fL)  Date Value  05/09/2023 88.7   RDW (%)  Date Value  05/09/2023 14.4   Platelet (10*9/L)  Date Value  05/09/2023 236   Neutrophils % (%)  Date Value  05/17/2022 53.2   Lymphocytes % (%)  Date Value  05/17/2022 29.0   Monocytes % (%)  Date Value  05/17/2022 7.8   Eosinophils % (%)  Date Value  05/17/2022 9.1   Basophils % (%)  Date Value  05/17/2022 0.9   Cholesterol:  Cholesterol, Total (mg/dL)  Date Value  98/90/7975 123  ,  Triglycerides (mg/dL)  Date Value  98/90/7975 165 (H)  ,  Cholesterol, HDL (mg/dL)  Date Value  98/90/7975 44  ,  Cholesterol, LDL, Calculated (mg/dL)  Date Value  98/90/7975 46 (L)   Creatinine:  Creatinine (mg/dL)  Date Value  97/86/7975 0.90

## 2024-01-24 NOTE — Progress Notes (Signed)
 [facility] Patient Clinic Note  Assessment/Plan: Mr.Coin is a 73 y.o.male  Assessment & Plan Type 2 diabetes mellitus with hyperglycemia, with long-term current use of insulin  (CMS-HCC) A1c is 12.8, indicating poor glycemic control. He is taking Lantus  38 units BID but is not consistently using meal-time insulin  correctly. Dietary habits, including high carbohydrate intake, contribute to hyperglycemia. Lack of insurance complicates access to care and medication affordability. Proper insulin  administration and dietary modifications are crucial to improve glycemic control and prevent further complications.  Discussed doing mealtime insulin  prior to meals.  We will plan on doing 4 units plus sliding scale and can adjust as needed.  He would also likely benefit from seeing her pharmacist for more tight insulin  titration. - Educate on the importance of administering insulin  before meals and adjusting doses based on blood sugar levels. - Encourage dietary modifications to reduce carbohydrate intake and avoid high-sugar foods and drinks. - Provide information on applying for Medicaid, charity care, and financial assistance programs through [facility] and [facility]. - Plan to follow up in one month to assess blood sugar control and review insulin  regimen. - Print [facility] and Pharmacy Assistance Program applications.   Orders: .  POCT glycosylated hemoglobin (Hb A1C)  Diabetic ulcer of toe of left foot associated with type 2 diabetes mellitus, unspecified ulcer stage (CMS-HCC) The left third toe has an ulcer with redness and pus, indicating a likely bacterial infection. The ulcer developed after the toenail and surrounding skin detached. The infection poses a risk of complications, including potential amputation, especially given previous amputations. Hyperglycemia contributes to poor wound healing and increased infection risk. Immediate intervention is necessary to  prevent further deterioration. - Initiate oral antibiotics: levofloxacin, metronidazole , and Bactrim. - Refer to podiatry for further evaluation and management, including potential debridement. - Instruct to monitor for systemic symptoms or spreading redness, necessitating an emergency room visit. - Inform Dr. Fernande about the new ulcer and the need for evaluation. - Advise to schedule a podiatry appointment as soon as possible.   Orders: .  levoFLOXacin (LEVAQUIN) 500 MG tablet; Take 1 tablet (500 mg total) by mouth daily for 10 days. .  metroNIDAZOLE  (FLAGYL ) 500 MG tablet; Take 1 tablet (500 mg total) by mouth two (2) times a day for 10 days. SABRA  sulfamethoxazole-trimethoprim (BACTRIM DS) 800-160 mg per tablet; Take 1 tablet (160 mg of trimethoprim total) by mouth two (2) times a day for 10 days.  Need for vaccination  Orders: .  INFLUENZA VACCINE IIV3(IM)(PF)6 MOS UP    Subjective Mr. Zweber is a 73 y.o. male  coming to clinic today for the following issues:  Chief Complaint  Patient presents with  . Diabetes    Pt reports he occasionally monitors blood sugars at home. Ranges have been all over place. Would like to discuss metformin  medication.   . Toe Injury    Pt would like to discuss toe injury he got at work.    HPI: History of Present Illness Woodley Petzold is a 73 year old male with diabetes who presents with a left foot ulcer.  Approximately three weeks ago, he noticed that his toenail came off along with the surrounding skin after removing his sock. He has been cleaning the area with saline solution, but there is persistent redness and pain, with pus and fluid discharge from the ulcer. He is concerned about the possibility of infection and the depth of the ulcer.  He has a history of  diabetes with a recent A1c of 12.8, indicating poor glycemic control. He is currently taking 38 units of insulin  twice a day and uses a sliding scale with Novolog  based on his blood sugar  levels. However, he does not consistently check his blood sugar before meals and sometimes administers insulin  after meals. His diet includes high carbohydrate meals and occasional sweets, which may contribute to his elevated blood sugar levels.  He has been out of work since March of this year and does not have health insurance, which has limited his access to medical care. He previously worked at [employer] and [employer]. He has attempted to apply for Medicaid and charity care but has faced challenges due to income limits and outstanding debts.  He has a history of previous toe amputations related to his diabetes. He is concerned about the potential for further amputations if his current ulcer does not improve.  No systemic symptoms such as fever or malaise.  - Pharmacy?  I have reviewed the problem list, medications, and allergies and have updated/reconciled them if needed.   Objective  VITALS: BP 132/76 (BP Position: Sitting)   Pulse 79   Temp 36.6 C (97.9 F) (Temporal)   Ht 183 cm (6' 0.05)   Wt 91.3 kg (201 lb 3.2 oz)   SpO2 97%   BMI 27.25 kg/m   Physical Exam Constitutional:      Appearance: Normal appearance.  HENT:     Head: Normocephalic and atraumatic.  Cardiovascular:     Rate and Rhythm: Normal rate and regular rhythm.     Pulses: Normal pulses.     Heart sounds: Normal heart sounds.  Pulmonary:     Effort: Pulmonary effort is normal.     Breath sounds: Normal breath sounds.  Musculoskeletal:     Right lower leg: No edema.     Left lower leg: No edema.  Skin:    General: Skin is warm and dry.  Neurological:     General: No focal deficit present.     Mental Status: He is alert and oriented to person, place, and time.  Psychiatric:        Mood and Affect: Mood normal.        Behavior: Behavior normal.        Thought Content: Thought content normal.        Judgment: Judgment normal.       LABS/IMAGING I have reviewed pertinent recent labs and  imaging in [facility] at [facility] [address], [place], [place] [address] . Telephone [phone] . Fax [phone]

## 2024-01-29 NOTE — Progress Notes (Signed)
 CC: Chief Complaint  Patient presents with  . Foot Ulcer    Jared Castillo presents for an ulceration on his left fourth toe.  Recently was seen by his primary care and placed on 3 different antibiotics.  Relates he is having severe pain with the fourth toe.   Objective: Previous transmetatarsal amputation on the right foot.  Prior first ray resection on the left.  Full-thickness ulcerative area noted on the distal tip of the left fourth toe measuring approximately 10 mm x 5 mm predebridement and 12 mm x 7 mm postdebridement with depth of 2 to 3 mm extending down to the level of probing of the bone.  X-rays: DP, oblique, and lateral of the left foot taken today and reviewed by myself reveals prior amputation of the first ray.  Dorsally dislocated second toe onto the metatarsal head with significant degenerative/Charcot changes noted at the 2nd and 3rd metatarsal phalangeal joints.  There is some cortical erosion and apparent fracturing in the distal phalanx of the fourth toe consistent with ongoing osteomyelitis.  Assessment: Encounter Diagnoses  Name Primary?  . Skin ulcer of toe of left foot with fat layer exposed (CMS/HHS-HCC) Yes  . Type 2 diabetes mellitus with polyneuropathy (CMS/HHS-HCC)   . S/P transmetatarsal amputation of foot, right (CMS/HHS-HCC)   . Acute osteomyelitis of left ankle or foot (CMS/HHS-HCC)   . 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 Dislocated toe, left, sequela     Plan: Debrided devitalized tissue from the ulcerative area on the left fourth toe sharply using a 15 blade and tissue nippers including the epidermal dermal layers as well as deeper full-thickness subcutaneous tissue down to the level of bone.  Bacitracin and gauze dressing applied.  Discussed with the patient and his wife that he will need amputation of his fourth toe.  Discussed with the patient that with the changes in the forefoot with dislocation at some point he may  need to consider undergoing transmetatarsal amputation on the left foot as previously performed on the right.  States that he has not had any problems with his right foot since his amputation.  Discussed coming in for surgical consult with Dr. Ashley and patient states that he does not have another $100 to cover the office visit.  Discussed that he could consider going into the hospital through the emergency department since he would like to consider undergoing transmetatarsal amputation since he has had good success with the other foot.  Stated that he did not do it tomorrow because it is his wife's birthday and said he would go on Wednesday.  Prescription sent in for Percocet for pain with the toe.  Patient will continue on his antibiotics and otherwise return as needed.  Orders Placed This Encounter  Procedures  . X-ray foot left 3 plus views    Standing Status:   Future    Number of Occurrences:   1    Expiration Date:   01/28/2025    This procedure is enabled for patient scheduling in MyChart. Please indicate if the patient should not be able to schedule an appointment for this procedure in MyChart.:   Allow MyChart Scheduling    Release to patient:   Immediate

## 2024-01-31 ENCOUNTER — Observation Stay
Admission: EM | Admit: 2024-01-31 | Discharge: 2024-02-01 | Disposition: A | Discharge status: Home / Self Care | Payer: Self-pay | Source: Ambulatory Visit | Attending: Podiatry | Admitting: Podiatry

## 2024-01-31 ENCOUNTER — Other Ambulatory Visit: Payer: Self-pay

## 2024-01-31 ENCOUNTER — Inpatient Hospital Stay: Payer: Self-pay

## 2024-01-31 DIAGNOSIS — E119 Type 2 diabetes mellitus without complications: Secondary | ICD-10-CM | POA: Insufficient documentation

## 2024-01-31 DIAGNOSIS — I10 Essential (primary) hypertension: Secondary | ICD-10-CM | POA: Diagnosis present

## 2024-01-31 DIAGNOSIS — E785 Hyperlipidemia, unspecified: Secondary | ICD-10-CM | POA: Insufficient documentation

## 2024-01-31 DIAGNOSIS — M86172 Other acute osteomyelitis, left ankle and foot: Principal | ICD-10-CM | POA: Insufficient documentation

## 2024-01-31 DIAGNOSIS — N189 Chronic kidney disease, unspecified: Secondary | ICD-10-CM | POA: Insufficient documentation

## 2024-01-31 DIAGNOSIS — Z7984 Long term (current) use of oral hypoglycemic drugs: Secondary | ICD-10-CM | POA: Insufficient documentation

## 2024-01-31 DIAGNOSIS — Z794 Long term (current) use of insulin: Secondary | ICD-10-CM | POA: Insufficient documentation

## 2024-01-31 DIAGNOSIS — J449 Chronic obstructive pulmonary disease, unspecified: Secondary | ICD-10-CM | POA: Insufficient documentation

## 2024-01-31 DIAGNOSIS — E11622 Type 2 diabetes mellitus with other skin ulcer: Secondary | ICD-10-CM

## 2024-01-31 DIAGNOSIS — Z87891 Personal history of nicotine dependence: Secondary | ICD-10-CM | POA: Insufficient documentation

## 2024-01-31 DIAGNOSIS — L089 Local infection of the skin and subcutaneous tissue, unspecified: Secondary | ICD-10-CM

## 2024-01-31 DIAGNOSIS — G6289 Other specified polyneuropathies: Secondary | ICD-10-CM

## 2024-01-31 DIAGNOSIS — Z79899 Other long term (current) drug therapy: Secondary | ICD-10-CM | POA: Insufficient documentation

## 2024-01-31 DIAGNOSIS — Z96651 Presence of right artificial knee joint: Secondary | ICD-10-CM | POA: Insufficient documentation

## 2024-01-31 DIAGNOSIS — E1165 Type 2 diabetes mellitus with hyperglycemia: Secondary | ICD-10-CM

## 2024-01-31 DIAGNOSIS — E11628 Type 2 diabetes mellitus with other skin complications: Secondary | ICD-10-CM

## 2024-01-31 DIAGNOSIS — I129 Hypertensive chronic kidney disease with stage 1 through stage 4 chronic kidney disease, or unspecified chronic kidney disease: Secondary | ICD-10-CM | POA: Insufficient documentation

## 2024-01-31 DIAGNOSIS — M869 Osteomyelitis, unspecified: Principal | ICD-10-CM | POA: Diagnosis present

## 2024-01-31 HISTORY — DX: Polyneuropathy, unspecified: G62.9

## 2024-01-31 LAB — CBC WITH DIFFERENTIAL/PLATELET
Abs Immature Granulocytes: 0.02 K/uL (ref 0.00–0.07)
Basophils Absolute: 0 K/uL (ref 0.0–0.1)
Basophils Relative: 0 %
Eosinophils Absolute: 0.6 K/uL — ABNORMAL HIGH (ref 0.0–0.5)
Eosinophils Relative: 9 %
HCT: 38.6 % — ABNORMAL LOW (ref 39.0–52.0)
Hemoglobin: 13.2 g/dL (ref 13.0–17.0)
Immature Granulocytes: 0 %
Lymphocytes Relative: 26 %
Lymphs Abs: 1.9 K/uL (ref 0.7–4.0)
MCH: 30.2 pg (ref 26.0–34.0)
MCHC: 34.2 g/dL (ref 30.0–36.0)
MCV: 88.3 fL (ref 80.0–100.0)
Monocytes Absolute: 0.7 K/uL (ref 0.1–1.0)
Monocytes Relative: 10 %
Neutro Abs: 4 K/uL (ref 1.7–7.7)
Neutrophils Relative %: 55 %
Platelets: 227 K/uL (ref 150–400)
RBC: 4.37 MIL/uL (ref 4.22–5.81)
RDW: 12.6 % (ref 11.5–15.5)
WBC: 7.3 K/uL (ref 4.0–10.5)
nRBC: 0 % (ref 0.0–0.2)

## 2024-01-31 LAB — BASIC METABOLIC PANEL WITH GFR
Anion gap: 14 (ref 5–15)
BUN: 17 mg/dL (ref 8–23)
CO2: 25 mmol/L (ref 22–32)
Calcium: 9.2 mg/dL (ref 8.9–10.3)
Chloride: 98 mmol/L (ref 98–111)
Creatinine, Ser: 0.88 mg/dL (ref 0.61–1.24)
GFR, Estimated: >60 mL/min (ref >60)
Glucose, Bld: 133 mg/dL — ABNORMAL HIGH (ref 70–99)
Potassium: 4 mmol/L (ref 3.5–5.1)
Sodium: 137 mmol/L (ref 135–145)

## 2024-01-31 LAB — GLUCOSE, CAPILLARY
Glucose-Capillary: 145 mg/dL — ABNORMAL HIGH (ref 70–99)
Glucose-Capillary: 169 mg/dL — ABNORMAL HIGH (ref 70–99)
Glucose-Capillary: 250 mg/dL — ABNORMAL HIGH (ref 70–99)

## 2024-01-31 LAB — C-REACTIVE PROTEIN: CRP: <0.5 mg/dL (ref <1.0)

## 2024-01-31 LAB — SEDIMENTATION RATE: Sed Rate: 17 mm/h (ref 0–20)

## 2024-01-31 LAB — PREALBUMIN: Prealbumin: 35 mg/dL (ref 18–38)

## 2024-01-31 LAB — HEMOGLOBIN A1C
Hgb A1c MFr Bld: 11.3 % — ABNORMAL HIGH (ref 4.8–5.6)
Mean Plasma Glucose: 277.61 mg/dL

## 2024-01-31 MED ORDER — LISINOPRIL-HYDROCHLOROTHIAZIDE 20-25 MG PO TABS: 0.5000 | ORAL_TABLET | Freq: Every day | ORAL | Status: DC

## 2024-01-31 MED ORDER — INSULIN GLARGINE-YFGN 100 UNIT/ML ~~LOC~~ SOLN
20.0000 [IU] | Freq: Two times a day (BID) | SUBCUTANEOUS | Status: DC
Start: 2024-01-31 — End: 2024-01-31

## 2024-01-31 MED ORDER — FLUTICASONE PROPIONATE 50 MCG/ACT NA SUSP: 2.0000 | Freq: Every day | NASAL | Status: DC | PRN

## 2024-01-31 MED ORDER — CHLORHEXIDINE GLUCONATE 4 % EX SOLN
60.0000 mL | Freq: Once | CUTANEOUS | Status: AC
  Administered 2024-02-01: 4 via TOPICAL

## 2024-01-31 MED ORDER — METOPROLOL SUCCINATE ER 50 MG PO TB24: 50.0000 mg | ORAL_TABLET | Freq: Every day | ORAL | Status: DC

## 2024-01-31 MED ORDER — HYDROCHLOROTHIAZIDE 12.5 MG PO TABS: 12.5000 mg | ORAL_TABLET | Freq: Every day | ORAL | Status: DC

## 2024-01-31 MED ORDER — OXYCODONE-ACETAMINOPHEN 5-325 MG PO TABS
1.0000 | ORAL_TABLET | Freq: Four times a day (QID) | ORAL | Status: DC | PRN
  Administered 2024-01-31 (×3): 2 via ORAL
  Filled 2024-01-31 (×3): qty 2

## 2024-01-31 MED ORDER — FLUTICASONE FUROATE 27.5 MCG/SPRAY NA SUSP: 2.0000 | Freq: Every day | NASAL | Status: DC

## 2024-01-31 MED ORDER — INSULIN GLARGINE-YFGN 100 UNIT/ML ~~LOC~~ SOLN
20.0000 [IU] | Freq: Two times a day (BID) | SUBCUTANEOUS | Status: DC
Start: 2024-01-31 — End: 2024-02-01
  Administered 2024-01-31: 20 [IU] via SUBCUTANEOUS
  Filled 2024-01-31 (×3): qty 0.2

## 2024-01-31 MED ORDER — ONDANSETRON HCL 4 MG/2ML IJ SOLN: 4.0000 mg | Freq: Four times a day (QID) | INTRAMUSCULAR | Status: DC | PRN

## 2024-01-31 MED ORDER — GLUCERNA SHAKE PO LIQD
237.0000 mL | Freq: Two times a day (BID) | ORAL | Status: DC
  Administered 2024-01-31: 237 mL via ORAL

## 2024-01-31 MED ORDER — INSULIN ASPART 100 UNIT/ML IJ SOLN
0.0000 [IU] | Freq: Three times a day (TID) | INTRAMUSCULAR | Status: DC
  Administered 2024-01-31: 2 [IU] via SUBCUTANEOUS
  Filled 2024-01-31: qty 1

## 2024-01-31 MED ORDER — PANTOPRAZOLE SODIUM 40 MG PO TBEC: 40.0000 mg | DELAYED_RELEASE_TABLET | Freq: Every day | ORAL | Status: DC

## 2024-01-31 MED ORDER — POVIDONE-IODINE 10 % EX SWAB: 2.0000 | Freq: Once | CUTANEOUS | Status: DC

## 2024-01-31 MED ORDER — SODIUM CHLORIDE 0.9 % IV SOLN
3.0000 g | Freq: Four times a day (QID) | INTRAVENOUS | Status: DC
Start: 2024-01-31 — End: 2024-02-01
  Administered 2024-01-31 – 2024-02-01 (×4): 3 g via INTRAVENOUS
  Filled 2024-01-31 (×7): qty 8

## 2024-01-31 MED ORDER — ACETAMINOPHEN 650 MG RE SUPP: 650.0000 mg | Freq: Four times a day (QID) | RECTAL | Status: DC | PRN

## 2024-01-31 MED ORDER — ENOXAPARIN SODIUM 40 MG/0.4ML IJ SOSY
40.0000 mg | PREFILLED_SYRINGE | INTRAMUSCULAR | Status: DC
  Administered 2024-01-31: 40 mg via SUBCUTANEOUS
  Filled 2024-01-31: qty 0.4

## 2024-01-31 MED ORDER — LISINOPRIL 10 MG PO TABS: 10.0000 mg | ORAL_TABLET | Freq: Every day | ORAL | Status: DC

## 2024-01-31 MED ORDER — GABAPENTIN 300 MG PO CAPS
600.0000 mg | ORAL_CAPSULE | Freq: Two times a day (BID) | ORAL | Status: DC
  Administered 2024-01-31: 600 mg via ORAL
  Filled 2024-01-31: qty 2

## 2024-01-31 MED ORDER — ROSUVASTATIN CALCIUM 20 MG PO TABS: 40.0000 mg | ORAL_TABLET | Freq: Every day | ORAL | Status: DC

## 2024-01-31 MED ORDER — VANCOMYCIN HCL 750 MG/150ML IV SOLN
750.0000 mg | Freq: Once | INTRAVENOUS | Status: AC
  Administered 2024-01-31: 750 mg via INTRAVENOUS
  Filled 2024-01-31: qty 150

## 2024-01-31 MED ORDER — ACETAMINOPHEN 325 MG PO TABS
650.0000 mg | ORAL_TABLET | Freq: Four times a day (QID) | ORAL | Status: DC | PRN
  Administered 2024-01-31: 650 mg via ORAL
  Filled 2024-01-31: qty 2

## 2024-01-31 MED ORDER — ALBUTEROL SULFATE (2.5 MG/3ML) 0.083% IN NEBU: 2.5000 mg | INHALATION_SOLUTION | Freq: Four times a day (QID) | RESPIRATORY_TRACT | Status: DC | PRN

## 2024-01-31 MED ORDER — VANCOMYCIN HCL 1250 MG/250ML IV SOLN
1250.0000 mg | Freq: Once | INTRAVENOUS | Status: AC
  Administered 2024-01-31: 1250 mg via INTRAVENOUS
  Filled 2024-01-31 (×2): qty 250

## 2024-01-31 MED ORDER — VANCOMYCIN HCL 1250 MG/250ML IV SOLN
1250.0000 mg | Freq: Two times a day (BID) | INTRAVENOUS | Status: DC
  Administered 2024-01-31 – 2024-02-01 (×2): 1250 mg via INTRAVENOUS
  Filled 2024-01-31 (×3): qty 250

## 2024-01-31 MED ORDER — ONDANSETRON HCL 4 MG PO TABS: 4.0000 mg | ORAL_TABLET | Freq: Four times a day (QID) | ORAL | Status: DC | PRN

## 2024-01-31 MED ORDER — SODIUM CHLORIDE 0.9 % IV SOLN
2.0000 g | Freq: Once | INTRAVENOUS | Status: AC
  Administered 2024-01-31: 2 g via INTRAVENOUS
  Filled 2024-01-31: qty 20

## 2024-01-31 NOTE — Assessment & Plan Note (Signed)
 Cont statin

## 2024-01-31 NOTE — Consult Note (Signed)
 Pharmacy Antibiotic Note  ASSESSMENT: 73 y.o. male with PMH including multiple toe amputations, uncontrolled T2DM is presenting with diabetic foot wound infection. Patient seen by podiatry earlier this week for left toe ulcerations concerning for osteomyelitis. Pharmacy has been consulted to manage Unasyn  and vancomycin  dosing. Patient's renal function is at baseline.  PLAN: Initiate Unasyn  3g IV q8H Administer vancomycin  2000mg  IV as a loading dose, followed by 1250mg  IV q12H thereafter eAUC 486, Cmax 32, Cmin 13.5 Scr 0.88, IBW, Vd 0.72 L/kg Follow up culture results to assess for antibiotic optimization. Monitor renal function to assess for any necessary antibiotic dosing changes.  Patient measurements: Height: 6' 4 (193 cm) Weight: 87.5 kg (193 lb) IBW/kg (Calculated) : 86.8  Vital signs: Temp: 98 F (36.7 C) (10/29 0902) Temp Source: Oral (10/29 0902) BP: 112/90 (10/29 0902) Pulse Rate: 69 (10/29 0902) Recent Labs  Lab 01/31/24 0817 01/31/24 1008  WBC 7.3  --   CREATININE  --  0.88   Estimated Creatinine Clearance: 93.2 mL/min (by C-G formula based on SCr of 0.88 mg/dL).  Allergies: Allergies  Allergen Reactions   Trazodone Other (See Comments)    Nightmares    Antimicrobials this admission: Unasyn  10/29 >> Vancomycin  10/29 >> Ceftriaxone  10/29 x 1  Dose adjustments this admission: n/a  Microbiology results: 10/29 BCx: sent  Thank you for allowing pharmacy to be a part of this patient's care.  Will M. Lenon, PharmD, BCPS Clinical Pharmacist 01/31/2024 11:53 AM

## 2024-01-31 NOTE — Evaluation (Signed)
 Physical Therapy Evaluation Patient Details Name: Jared Castillo MRN: [id number] DOB: February 03, 1951 Today's Date: 01/31/2024  History of Present Illness  Pt is a 73 y/o M admitted on 01/31/24 after presenting with c/o LLE wound infection. Pt is being treated for osteomyelitis. Podiatry consulted & recommends 4th toe amputation; plan for later this week. PMH: poorly controlled IDDM, HTN, HLD, peripheral neuropathy, GERD, multiple toe amputations, CKD, shingles  Clinical Impression  Pt seen for PT evaluation with pt agreeable, wife present in session. Prior to admission pt was mod I with PRN use of SPC, denies falls, lives with wife who can assist. On this date, pt is able to ambulate around unit with [facility] & [facility], gait pattern as noted below. Educated pt & wife on possible need for ongoing therapy after procedure tomorrow. Will follow acutely.      If plan is discharge home, recommend the following: Assist for transportation   Can travel by private vehicle        Equipment Recommendations None recommended by PT  Recommendations for Other Services       Functional Status Assessment Patient has had a recent decline in their functional status and demonstrates the ability to make significant improvements in function in a reasonable and predictable amount of time.     Precautions / Restrictions Precautions Precautions: Fall Restrictions Weight Bearing Restrictions Per Provider Order: No      Mobility  Bed Mobility Overal bed mobility: Modified Independent                  Transfers Overall transfer level: Needs assistance   Transfers: Sit to/from Stand Sit to Stand: Supervision           General transfer comment: sit<>stand from EOB    Ambulation/Gait Ambulation/Gait assistance: Contact guard assist Gait Distance (Feet): 170 Feet Assistive device: Straight cane Gait Pattern/deviations: Decreased step length - right, Decreased step length - left, Decreased stride  length, Decreased weight shift to right Gait velocity: slightly decreased Gait velocity interpretation: <1.31 ft/sec, indicative of household ambulator   General Gait Details: no overt LOB  Stairs            Wheelchair Mobility     Tilt Bed    Modified Rankin (Stroke Patients Only)       Balance Overall balance assessment: Needs assistance Sitting-balance support: Feet supported Sitting balance-Leahy Scale: Good     Standing balance support: During functional activity, Single extremity supported Standing balance-Leahy Scale: Fair                               Pertinent Vitals/Pain Pain Assessment Pain Assessment: No/denies pain    Home Living Family/patient expects to be discharged to:: Private residence Living Arrangements: Spouse/significant other Available Help at Discharge: Family Type of Home: Mobile home Home Access: Ramped entrance       Home Layout: One level Home Equipment: Agricultural Consultant (2 wheels);Cane - single point      Prior Function               Mobility Comments: Ambulatory with PRN use of SPC, denies falls in the past 6 months, does not drive. ADLs Comments: Independent with bathing, dressing.     Extremity/Trunk Assessment   Upper Extremity Assessment Upper Extremity Assessment: Overall WFL for tasks assessed    Lower Extremity Assessment Lower Extremity Assessment: Overall WFL for tasks assessed (hx of R foot ?transmet amputation x ~30 years)  Cervical / Trunk Assessment Cervical / Trunk Assessment: Normal  Communication   Communication Communication: No apparent difficulties    Cognition Arousal: Alert Behavior During Therapy: WFL for tasks assessed/performed   PT - Cognitive impairments: No apparent impairments                         Following commands: Intact       Cueing Cueing Techniques: Verbal cues     General Comments General comments (skin integrity, edema, etc.): Educated  pt on potential for weight bearing status, updated precautions after procedure tomorrow.    Exercises     Assessment/Plan    PT Assessment    PT Problem List         PT Treatment Interventions      PT Goals (Current goals can be found in the Care Plan section)  Acute Rehab PT Goals Patient Stated Goal: get procedure done & then go home PT Goal Formulation: With patient/family Time For Goal Achievement: 02/14/24 Potential to Achieve Goals: Good    Frequency       Co-evaluation               AM-PAC PT 6 Clicks Mobility  Outcome Measure Help needed turning from your back to your side while in a flat bed without using bedrails?: None Help needed moving from lying on your back to sitting on the side of a flat bed without using bedrails?: None Help needed moving to and from a bed to a chair (including a wheelchair)?: None Help needed standing up from a chair using your arms (e.g., wheelchair or bedside chair)?: None Help needed to walk in hospital room?: A Little Help needed climbing 3-5 steps with a railing? : None 6 Click Score: 23    End of Session   Activity Tolerance: Patient tolerated treatment well Patient left: in bed;with call bell/phone within reach;with bed alarm set (sitting EOB with wife present)   PT Visit Diagnosis: Unsteadiness on feet (R26.81)    Time: 8749-8741 PT Time Calculation (min) (ACUTE ONLY): 8 min   Charges:   PT Evaluation $PT Eval Low Complexity: 1 Low   PT General Charges $$ ACUTE PT VISIT: 1 Visit         Richerd Pinal, PT, DPT 01/31/24, 1:30 PM   Richerd CHRISTELLA Pinal 01/31/2024, 1:29 PM

## 2024-01-31 NOTE — Assessment & Plan Note (Signed)
 BP stable Titrate home regimen

## 2024-01-31 NOTE — Consult Note (Signed)
 ORTHOPAEDIC CONSULTATION  REQUESTING PHYSICIAN: Eldonna Elspeth PARAS, MD  Chief Complaint: Left fourth toe infection  HPI: Jared Castillo is a 73 y.o. male who complains of worsening wound infection to his left fourth toe.  He was seen in the outpatient clinic earlier this week.  X-ray showed erosive changes on the distal aspect of his left fourth toe.  Longstanding history of diabetes with chronic ulcerations.  He status post right foot transmetatarsal amputation.  He is also undergoing previous first ray amputation on the left foot.  Past Medical History:  Diagnosis Date   Chronic kidney disease    stones   Diabetes mellitus without complication (HCC)    GERD (gastroesophageal reflux disease)    Hypertension    Neuropathy    Shingles rash 04/04/2010   dx yesterday 03/21/11   Past Surgical History:  Procedure Laterality Date   AMPUTATION TOE Left 10/09/2022   Procedure: AMPUTATION TOE;  Surgeon: Ashley Soulier, DPM;  Location: [facility];  Service: Orthopedics/Podiatry;  Laterality: Left;   APPENDECTOMY     child   IRRIGATION AND DEBRIDEMENT FOOT Right 08/23/2019   Procedure: RIGHT FOOT IRRIGATION AND DEBRIDEMENT, RIGHT ACHILLES TENDON LENGTHENING,EXCISION RIGHT FIRST METATARSAL;  Surgeon: Ashley Soulier, DPM;  Location: [facility];  Service: Podiatry;  Laterality: Right;   IRRIGATION AND DEBRIDEMENT FOOT Right 02/16/2022   Procedure: IRRIGATION AND DEBRIDEMENT FOOT;  Surgeon: Ashley Soulier, DPM;  Location: [facility];  Service: Podiatry;  Laterality: Right;   LOWER EXTREMITY ANGIOGRAPHY Left 10/11/2022   Procedure: Lower Extremity Angiography;  Surgeon: Marea Selinda RAMAN, MD;  Location: [facility];  Service: Cardiovascular;  Laterality: Left;   TOE AMPUTATION  78   rt great and second   TOTAL KNEE ARTHROPLASTY  03/30/2011   Procedure: TOTAL KNEE ARTHROPLASTY;  Surgeon: Jerona LULLA Sage, MD;  Location: [facility];  Service: Orthopedics;  Laterality: Right;  Right Total Knee Arthroplastly    TRANSMETATARSAL AMPUTATION Right 02/16/2022   Procedure: TRANSMETATARSAL AMPUTATION;  Surgeon: Ashley Soulier, DPM;  Location: [facility];  Service: Podiatry;  Laterality: Right;   TRANSMETATARSAL AMPUTATION Left 12/10/2022   Procedure: LEFT FOOT SOFT TISSUE AND BONE DEBRIDEMENT BONE;  Surgeon: Neill Boas, DPM;  Location: [facility];  Service: Orthopedics/Podiatry;  Laterality: Left;   Social History   Socioeconomic History   Marital status: Married    Spouse name: Not on file   Number of children: Not on file   Years of education: Not on file   Highest education level: Not on file  Occupational History   Not on file  Tobacco Use   Smoking status: Former   Smokeless tobacco: Never  Vaping Use   Vaping status: Never Used  Substance and Sexual Activity   Alcohol use: Not Currently    Alcohol/week: 2.0 - 3.0 standard drinks of alcohol    Types: 2 - 3 Cans of beer per week    Comment: no alcohol in a year   Drug use: No   Sexual activity: Not on file  Other Topics Concern   Not on file  Social History Narrative   Not on file   Social Drivers of Health   Financial Resource Strain: Low Risk  (01/29/2024)   Received from [facility]   Overall Financial Resource Strain (CARDIA)    Difficulty of Paying Living Expenses: Not hard at all  Food Insecurity: No Food Insecurity (01/31/2024)   Hunger Vital Sign    Worried About Running Out of Food in the Last  Year: Never true    Ran Out of Food in the Last Year: Never true  Transportation Needs: No Transportation Needs (01/31/2024)   PRAPARE - Administrator, Civil Service (Medical): No    Lack of Transportation (Non-Medical): No  Physical Activity: Not on file  Stress: Not on file  Social Connections: Unknown (01/31/2024)   Social Connection and Isolation Panel    Frequency of Communication with Friends and Family: More than three times a week    Frequency of Social Gatherings with Friends and Family:  Patient declined    Attends Religious Services: Patient declined    Database Administrator or Organizations: Patient declined    Attends Banker Meetings: Patient declined    Marital Status: Married   History reviewed. No pertinent family history. Allergies  Allergen Reactions   Trazodone Other (See Comments)    Nightmares   Prior to Admission medications   Medication Sig Start Date End Date Taking? Authorizing Provider  albuterol  (VENTOLIN  HFA) 108 (90 Base) MCG/ACT inhaler Inhale 1-2 puffs into the lungs every 6 (six) hours as needed for wheezing or shortness of breath.    [provider]  ascorbic acid  (VITAMIN C ) 500 MG tablet Take 1 tablet (500 mg total) by mouth 2 (two) times daily. 12/12/22   Amin, Sumayya, MD  feeding supplement, GLUCERNA SHAKE, (GLUCERNA SHAKE) LIQD Take 237 mLs by mouth 2 (two) times daily between meals. 12/13/22   Amin, Sumayya, MD  fluticasone  (VERAMYST) 27.5 MCG/SPRAY nasal spray Place 2 sprays into the nose daily.    [provider]  gabapentin  (NEURONTIN ) 300 MG capsule Take 600 mg by mouth 2 (two) times daily. 2 caps TID    [provider]  insulin  detemir (LEVEMIR ) 100 UNIT/ML injection Inject 0.23 mLs (23 Units total) into the skin 2 (two) times daily. 10/12/22   Darci Pore, MD  insulin  lispro (HUMALOG ) 100 UNIT/ML injection Inject 0-10 Units into the skin 3 (three) times daily as needed for high blood sugar. Sliding scale as needed for BG over 200.    [provider]  lisinopril -hydrochlorothiazide  (ZESTORETIC ) 20-25 MG tablet Take 0.5 tablets by mouth daily.    [provider]  metFORMIN  (GLUCOPHAGE ) 1000 MG tablet Take 1,000 mg by mouth 2 (two) times daily with a meal.    [provider]  metoprolol  succinate (TOPROL -XL) 50 MG 24 hr tablet Take 50 mg by mouth daily. Take with or immediately following a meal.    [provider]  Multiple Vitamin (MULTIVITAMIN WITH MINERALS)  TABS tablet Take 1 tablet by mouth daily. 02/18/22   Alexander, Natalie, DO  mupirocin  ointment (BACTROBAN ) 2 % Place 1 Application into the nose 2 (two) times daily. 10/12/22   Darci Pore, MD  omeprazole (PRILOSEC) 20 MG capsule Take 20 mg by mouth 2 (two) times daily.    [provider]  oxyCODONE -acetaminophen  (PERCOCET/ROXICET) 5-325 MG tablet Take 1-2 tablets by mouth every 4 (four) hours as needed for severe pain. 12/12/22   Amin, Sumayya, MD  rosuvastatin  (CRESTOR ) 40 MG tablet Take 40 mg by mouth daily.    [provider]  zinc  sulfate 220 (50 Zn) MG capsule Take 1 capsule (220 mg total) by mouth daily. 12/12/22   Caleen Qualia, MD   No results found.  Positive ROS: All other systems have been reviewed and were otherwise negative with the exception of those mentioned in the HPI and as above.  12 point ROS was performed.  Physical Exam: General: Alert and oriented.  No apparent distress.  Vascular:  Left foot:Dorsalis Pedis:  present Posterior Tibial:  present  Right foot: Dorsalis Pedis:  present Posterior Tibial:  present  Neuro:absent protective sensation  Derm: The distal aspect of the left fourth toe there is noted open ulceration with erythema distally.  No lymphangitic streaking.  No plantar forefoot ulcerations at this time.  First ray amputation site is well-healed  Ortho/MS: Status post right foot transmetatarsal amputation.  Status post left first ray amputation  Assessment: Osteomyelitis distal left fourth toe Diabetes with neuropathy  Plan: We had a long discussion regards to the osteomyelitis on the distal left fourth toe.  We discussed differences in surgical recommendations.  He had previously discussed performing a transmetatarsal amputation to this left forefoot as he has performed so well on his right foot.  Given the small focal area of infection and its only in the distal aspect of the left fourth toe I would recommend distal fourth  toe amputation at this time.  If he ever develops any forefoot worsening wounds and ulcerations that would need a transmetatarsal amputation we can proceed with that at that time.  The plan was for surgery this afternoon but unfortunately he drank coffee with cream.  We are going to delay surgery until tomorrow at noon.  Orders will be placed.    Ashley Eva LABOR, DPM Cell [phone]   01/31/2024 1:53 PM

## 2024-01-31 NOTE — ED Provider Notes (Signed)
 [facility] Provider Note    Event Date/Time   First MD Initiated Contact with Patient 01/31/24 [phone]     (approximate)   History   Chief Complaint: Wound Infection   HPI  Jared Castillo is a 73 y.o. male with a GERD, diabetes, CKD who comes ED due to infected wound on the left fourth toe.  Saw primary care a week ago, was started on Levaquin, Flagyl , Bactrim, took these 3 antibiotics for several days, then saw podiatry 2 days ago.  Debridement showed that the wound extended to the bone.  X-rays in the office showed signs of osteomyelitis along with degenerative changes of the other toes.  Plan was for patient to come to the ED for hospitalization and amputation, possible TMA.  Patient denies fever, chest pain, shortness of breath        Past Medical History:  Diagnosis Date   Chronic kidney disease    stones   Diabetes mellitus without complication (HCC)    GERD (gastroesophageal reflux disease)    Hypertension    Neuropathy    Shingles rash 04/04/2010   dx yesterday 03/21/11    Current Outpatient Rx   Order #: [id number] Class: Historical Med   Order #: [id number] Class: Normal   Order #: [id number] Class: Normal   Order #: [id number] Class: Historical Med   Order #: [id number] Class: Historical Med   Order #: [id number] Class: No Print   Order #: [id number] Class: Historical Med   Order #: [id number] Class: Historical Med   Order #: [id number] Class: Historical Med   Order #: [id number] Class: Historical Med   Order #: [id number] Class: OTC   Order #: [id number] Class: Normal   Order #: [id number] Class: Historical Med   Order #: [id number] Class: Normal   Order #: [id number] Class: Historical Med   Order #: [id number] Class: Normal    Past Surgical History:  Procedure Laterality Date   AMPUTATION TOE Left 10/09/2022   Procedure: AMPUTATION TOE;  Surgeon: Ashley Soulier, DPM;  Location: [facility];  Service: Orthopedics/Podiatry;  Laterality: Left;   APPENDECTOMY      child   IRRIGATION AND DEBRIDEMENT FOOT Right 08/23/2019   Procedure: RIGHT FOOT IRRIGATION AND DEBRIDEMENT, RIGHT ACHILLES TENDON LENGTHENING,EXCISION RIGHT FIRST METATARSAL;  Surgeon: Ashley Soulier, DPM;  Location: [facility];  Service: Podiatry;  Laterality: Right;   IRRIGATION AND DEBRIDEMENT FOOT Right 02/16/2022   Procedure: IRRIGATION AND DEBRIDEMENT FOOT;  Surgeon: Ashley Soulier, DPM;  Location: [facility];  Service: Podiatry;  Laterality: Right;   LOWER EXTREMITY ANGIOGRAPHY Left 10/11/2022   Procedure: Lower Extremity Angiography;  Surgeon: Marea Selinda RAMAN, MD;  Location: [facility];  Service: Cardiovascular;  Laterality: Left;   TOE AMPUTATION  79   rt great and second   TOTAL KNEE ARTHROPLASTY  03/30/2011   Procedure: TOTAL KNEE ARTHROPLASTY;  Surgeon: Jerona LULLA Sage, MD;  Location: [facility];  Service: Orthopedics;  Laterality: Right;  Right Total Knee Arthroplastly   TRANSMETATARSAL AMPUTATION Right 02/16/2022   Procedure: TRANSMETATARSAL AMPUTATION;  Surgeon: Ashley Soulier, DPM;  Location: [facility];  Service: Podiatry;  Laterality: Right;   TRANSMETATARSAL AMPUTATION Left 12/10/2022   Procedure: LEFT FOOT SOFT TISSUE AND BONE DEBRIDEMENT BONE;  Surgeon: Neill Boas, DPM;  Location: [facility];  Service: Orthopedics/Podiatry;  Laterality: Left;    Physical Exam   Triage Vital Signs: ED Triage Vitals  Encounter Vitals Group     BP 01/31/24 0730 (!) 174/93     Girls Systolic BP Percentile --      Girls Diastolic BP  Percentile --      Boys Systolic BP Percentile --      Boys Diastolic BP Percentile --      Pulse Rate 01/31/24 0730 77     Resp 01/31/24 0730 20     Temp 01/31/24 0730 97.6 F (36.4 C)     Temp Source 01/31/24 0730 Oral     SpO2 01/31/24 0730 99 %     Weight 01/31/24 0729 193 lb (87.5 kg)     Height 01/31/24 0729 6' 4 (1.93 m)     Head Circumference --      Peak Flow --      Pain Score 01/31/24 0728 8     Pain Loc --      Pain Education --      Exclude from  Growth Chart --     Most recent vital signs: Vitals:   01/31/24 0730  BP: (!) 174/93  Pulse: 77  Resp: 20  Temp: 97.6 F (36.4 C)  SpO2: 99%    General: Awake, no distress.  CV:  Good peripheral perfusion.  Normal DP pulse and distal cap refill Resp:  Normal effort.  Abd:  No distention.  Other:  No lower extremity edema   ED Results / Procedures / Treatments   Labs (all labs ordered are listed, but only abnormal results are displayed) Labs Reviewed  CBC WITH DIFFERENTIAL/PLATELET  BASIC METABOLIC PANEL WITH GFR     EKG    RADIOLOGY    PROCEDURES:  Procedures   MEDICATIONS ORDERED IN ED: Medications  cefTRIAXone  (ROCEPHIN ) 2 g in sodium chloride  0.9 % 100 mL IVPB (has no administration in time range)  vancomycin  (VANCOREADY) IVPB 1250 mg/250 mL (has no administration in time range)     IMPRESSION / MDM / ASSESSMENT AND PLAN / ED COURSE  I reviewed the triage vital signs and the nursing notes.  Patient's presentation is most consistent with acute presentation with potential threat to life or bodily function.  Patient presents with osteomyelitis of left fourth toe related to insulin -dependent diabetes and foot ulcer and outpatient treatment failure with multiple antibiotics.  Will start IV Rocephin  and vancomycin .  Case discussed with hospitalist for further management.  Patient last ate yesterday, only had coffee and medications this morning.       FINAL CLINICAL IMPRESSION(S) / ED DIAGNOSES   Final diagnoses:  Osteomyelitis of fourth toe of left foot (HCC)  Type 2 diabetes mellitus with other skin ulcer, with long-term current use of insulin  (HCC)     Rx / DC Orders   ED Discharge Orders     None        Note:  This document was prepared using Dragon voice recognition software and may include unintentional dictation errors.   Viviann Pastor, MD 01/31/24 [phone]

## 2024-01-31 NOTE — H&P (Signed)
 History and Physical    Patient: Jared Castillo FMW:[id number] DOB: May 05, 1950 DOA: 01/31/2024 DOS: the patient was seen and examined on 01/31/2024 PCP: Ashley Boby LABOR, MD  Patient coming from: Home  Chief Complaint:  Chief Complaint  Patient presents with   Wound Infection   HPI: Jared Castillo is a 73 y.o. male with medical history significant of poorly controlled insulin -dependent diabetes, hypertension, hyperlipidemia, peripheral neuropathy, GERD, multiple toe amputations presented with left lower extremity wound infection.  Patient with noted baseline history of recurrent lower extremity digit infections in the setting of poorly controlled type 2 diabetes status post multiple amputations.  Was recently seen by PCP with noted ulceration of the left fourth digit and placed on triple antibiotic therapy.  Had follow-up with podiatry via Dr. Neill on October 27.  Evaluation showed fourth toe ulcerations concerning for osteomyelitis.  Had plain films in the outpatient setting concerning for osteo.  Overall plan was for transmetatarsal amputation given recurrent nature of symptoms.  Was otherwise continued on antibiotics including Levaquin, Flagyl , Bactrim.  Patient denies any active tobacco abuse.  No reported alcohol use.  Blood sugars have been ranging in the 900s to 300s though he states improvement with regimen of Lantus  38 units twice daily.  No reported purulent drainage from the affected area.  No reported known trauma.  No fevers or chills.  No chest pain or shortness of breath.  No belly pain or diarrhea.  Was redirected to the ER by podiatry. Review of Systems: As mentioned in the history of present illness. All other systems reviewed and are negative. Past Medical History:  Diagnosis Date   Chronic kidney disease    stones   Diabetes mellitus without complication (HCC)    GERD (gastroesophageal reflux disease)    Hypertension    Neuropathy    Shingles rash 04/04/2010   dx yesterday  03/21/11   Past Surgical History:  Procedure Laterality Date   AMPUTATION TOE Left 10/09/2022   Procedure: AMPUTATION TOE;  Surgeon: Ashley Soulier, DPM;  Location: [facility];  Service: Orthopedics/Podiatry;  Laterality: Left;   APPENDECTOMY     child   IRRIGATION AND DEBRIDEMENT FOOT Right 08/23/2019   Procedure: RIGHT FOOT IRRIGATION AND DEBRIDEMENT, RIGHT ACHILLES TENDON LENGTHENING,EXCISION RIGHT FIRST METATARSAL;  Surgeon: Ashley Soulier, DPM;  Location: [facility];  Service: Podiatry;  Laterality: Right;   IRRIGATION AND DEBRIDEMENT FOOT Right 02/16/2022   Procedure: IRRIGATION AND DEBRIDEMENT FOOT;  Surgeon: Ashley Soulier, DPM;  Location: [facility];  Service: Podiatry;  Laterality: Right;   LOWER EXTREMITY ANGIOGRAPHY Left 10/11/2022   Procedure: Lower Extremity Angiography;  Surgeon: Marea Selinda RAMAN, MD;  Location: [facility];  Service: Cardiovascular;  Laterality: Left;   TOE AMPUTATION  79   rt great and second   TOTAL KNEE ARTHROPLASTY  03/30/2011   Procedure: TOTAL KNEE ARTHROPLASTY;  Surgeon: Jerona LULLA Sage, MD;  Location: [facility];  Service: Orthopedics;  Laterality: Right;  Right Total Knee Arthroplastly   TRANSMETATARSAL AMPUTATION Right 02/16/2022   Procedure: TRANSMETATARSAL AMPUTATION;  Surgeon: Ashley Soulier, DPM;  Location: [facility];  Service: Podiatry;  Laterality: Right;   TRANSMETATARSAL AMPUTATION Left 12/10/2022   Procedure: LEFT FOOT SOFT TISSUE AND BONE DEBRIDEMENT BONE;  Surgeon: Neill Boas, DPM;  Location: [facility];  Service: Orthopedics/Podiatry;  Laterality: Left;   Social History:  reports that he has quit smoking. He has never used smokeless tobacco. He reports that he does not currently use alcohol after a past usage of about  2.0 - 3.0 standard drinks of alcohol per week. He reports that he does not use drugs.  Allergies  Allergen Reactions   Trazodone Other (See Comments)    Nightmares    History reviewed. No pertinent family history.  Prior to  Admission medications   Medication Sig Start Date End Date Taking? Authorizing Provider  albuterol  (VENTOLIN  HFA) 108 (90 Base) MCG/ACT inhaler Inhale 1-2 puffs into the lungs every 6 (six) hours as needed for wheezing or shortness of breath.    [provider]  ascorbic acid  (VITAMIN C ) 500 MG tablet Take 1 tablet (500 mg total) by mouth 2 (two) times daily. 12/12/22   Amin, Sumayya, MD  feeding supplement, GLUCERNA SHAKE, (GLUCERNA SHAKE) LIQD Take 237 mLs by mouth 2 (two) times daily between meals. 12/13/22   Amin, Sumayya, MD  fluticasone  (VERAMYST) 27.5 MCG/SPRAY nasal spray Place 2 sprays into the nose daily.    [provider]  gabapentin  (NEURONTIN ) 300 MG capsule Take 600 mg by mouth 2 (two) times daily. 2 caps TID    [provider]  insulin  detemir (LEVEMIR ) 100 UNIT/ML injection Inject 0.23 mLs (23 Units total) into the skin 2 (two) times daily. 10/12/22   Darci Pore, MD  insulin  lispro (HUMALOG ) 100 UNIT/ML injection Inject 0-10 Units into the skin 3 (three) times daily as needed for high blood sugar. Sliding scale as needed for BG over 200.    [provider]  lisinopril -hydrochlorothiazide  (ZESTORETIC ) 20-25 MG tablet Take 0.5 tablets by mouth daily.    [provider]  metFORMIN  (GLUCOPHAGE ) 1000 MG tablet Take 1,000 mg by mouth 2 (two) times daily with a meal.    [provider]  metoprolol  succinate (TOPROL -XL) 50 MG 24 hr tablet Take 50 mg by mouth daily. Take with or immediately following a meal.    [provider]  Multiple Vitamin (MULTIVITAMIN WITH MINERALS) TABS tablet Take 1 tablet by mouth daily. 02/18/22   Alexander, Natalie, DO  mupirocin  ointment (BACTROBAN ) 2 % Place 1 Application into the nose 2 (two) times daily. 10/12/22   Darci Pore, MD  omeprazole (PRILOSEC) 20 MG capsule Take 20 mg by mouth 2 (two) times daily.    [provider]  oxyCODONE -acetaminophen  (PERCOCET/ROXICET) 5-325  MG tablet Take 1-2 tablets by mouth every 4 (four) hours as needed for severe pain. 12/12/22   Amin, Sumayya, MD  rosuvastatin  (CRESTOR ) 40 MG tablet Take 40 mg by mouth daily.    [provider]  zinc  sulfate 220 (50 Zn) MG capsule Take 1 capsule (220 mg total) by mouth daily. 12/12/22   Caleen Qualia, MD    Physical Exam: Vitals:   01/31/24 0729 01/31/24 0730 01/31/24 0902  BP:  (!) 174/93 (!) 112/90  Pulse:  77 69  Resp:  20 18  Temp:  97.6 F (36.4 C) 98 F (36.7 C)  TempSrc:  Oral Oral  SpO2:  99% 99%  Weight: 87.5 kg    Height: 6' 4 (1.93 m)     Physical Exam Constitutional:      Appearance: He is normal weight.  HENT:     Head: Normocephalic and atraumatic.     Nose: Nose normal.     Mouth/Throat:     Mouth: Mucous membranes are moist.  Eyes:     Pupils: Pupils are equal, round, and reactive to light.  Cardiovascular:     Rate and Rhythm: Normal rate and regular rhythm.  Pulmonary:     Effort: Pulmonary effort  is normal.  Abdominal:     General: Bowel sounds are normal.  Musculoskeletal:     Comments: S/p multiple LE digit amputations    Skin:    Comments: See picture    Neurological:     General: No focal deficit present.              Data Reviewed:  There are no new results to review at this time.   Lab Results  Component Value Date   WBC 7.3 01/31/2024   HGB 13.2 01/31/2024   HCT 38.6 (L) 01/31/2024   MCV 88.3 01/31/2024   PLT 227 01/31/2024   Last metabolic panel Lab Results  Component Value Date   GLUCOSE 243 (H) 12/11/2022   NA 133 (L) 12/11/2022   K 4.0 12/11/2022   CL 99 12/11/2022   CO2 27 12/11/2022   BUN 11 12/11/2022   CREATININE 0.85 12/11/2022   GFRNONAA >60 12/11/2022   CALCIUM  8.7 (L) 12/11/2022   PROT 6.5 12/10/2022   ALBUMIN 2.7 (L) 12/10/2022   BILITOT 0.4 12/10/2022   ALKPHOS 80 12/10/2022   AST 13 (L) 12/10/2022   ALT 12 12/10/2022   ANIONGAP 7 12/11/2022    Assessment and Plan: * Osteomyelitis  (HCC) Diabetic foot infection  Patient with noted worsening ulceration of the left fourth toe in setting of multiple prior diabetic foot infections and osteomyelitis requiring amputation Seen by Dr. Neill  with podiatry on 10/27 with recommendation of fourth toe amputation White count within normal limits Will place on IV Unasyn  and vancomycin  for infectious coverage Blood cultures x1 MRI of the left foot to better assess Podiatry consulted-follow-up recommendations  Uncontrolled type 2 diabetes mellitus with hyperglycemia, with long-term current use of insulin  (HCC) 01/2024 A1c at 12.8  Formal blood sugar still pending  On Lantus  38u BID  Will transition to Lantus  20u BID w/ SSI perioperatively  Monitor  Essential hypertension BP stable  Titrate home regimen    HLD (hyperlipidemia) Cont statin    Primary hypertension BP stable  Titrate home regimen        Advance Care Planning:   Code Status: Full Code   Consults: Podiatry   Family Communication: No family at the bedside   Severity of Illness: The appropriate patient status for this patient is OBSERVATION. Observation status is judged to be reasonable and necessary in order to provide the required intensity of service to ensure the patient's safety. The patient's presenting symptoms, physical exam findings, and initial radiographic and laboratory data in the context of their medical condition is felt to place them at decreased risk for further clinical deterioration. Furthermore, it is anticipated that the patient will be medically stable for discharge from the hospital within 2 midnights of admission.   Author: Elspeth JINNY Masters, MD 01/31/2024 10:03 AM  For on call review [email or web address].

## 2024-01-31 NOTE — Plan of Care (Signed)
  Problem: Education: Goal: Knowledge of General Education information will improve Description: Including pain rating scale, medication(s)/side effects and non-pharmacologic comfort measures Outcome: Progressing   Problem: Health Behavior/Discharge Planning: Goal: Ability to manage health-related needs will improve Outcome: Progressing   Problem: Clinical Measurements: Goal: Ability to maintain clinical measurements within normal limits will improve Outcome: Progressing   Problem: Activity: Goal: Risk for activity intolerance will decrease Outcome: Progressing   Problem: Nutrition: Goal: Adequate nutrition will be maintained Outcome: Progressing   Problem: Pain Managment: Goal: General experience of comfort will improve and/or be controlled Outcome: Progressing   Problem: Safety: Goal: Ability to remain free from injury will improve Outcome: Progressing

## 2024-01-31 NOTE — Assessment & Plan Note (Addendum)
 01/2024 A1c at 12.8  Formal blood sugar still pending  On Lantus  38u BID  Will transition to Lantus  20u BID w/ SSI perioperatively  Monitor

## 2024-01-31 NOTE — Evaluation (Signed)
 Occupational Therapy Evaluation Patient Details Name: Jared Castillo MRN: [id number] DOB: May 17, 1950 Today's Date: 01/31/2024   History of Present Illness   Pt is a 73 y/o M admitted on 01/31/24 after presenting with c/o LLE wound infection. Pt is being treated for osteomyelitis. Podiatry consulted & recommends 4th toe amputation; plan for later this week. PMH: poorly controlled IDDM, HTN, HLD, peripheral neuropathy, GERD, multiple toe amputations, CKD, shingles     Clinical Impressions Chart reviewed to date, pt greeted semi supine in bed, agreeable to OT evaluation. PTA pt is generally MOD I-I in ADL/IADL, amb with PRN use of SPC. Pt is performing functional mobility at a CGA-supervision level including toilet tranfers. Anticipate MOD I for dressing. Pt/wife with questions re: grab bars for home, provided education regarding suction cup vs placing a permanent grab bar (this is preferred vs suction cup). No further OT needs at this time. OT will sign off. Please re consult if there is a change in functional status.      If plan is discharge home, recommend the following:   A little help with bathing/dressing/bathroom;Assist for transportation;Help with stairs or ramp for entrance     Functional Status Assessment   Patient has had a recent decline in their functional status and demonstrates the ability to make significant improvements in function in a reasonable and predictable amount of time.     Equipment Recommendations   None recommended by OT     Recommendations for Other Services         Precautions/Restrictions   Precautions Precautions: Fall Recall of Precautions/Restrictions: Intact Restrictions Weight Bearing Restrictions Per Provider Order: No     Mobility Bed Mobility Overal bed mobility: Modified Independent                  Transfers Overall transfer level: Needs assistance   Transfers: Sit to/from Stand Sit to Stand: Supervision                   Balance Overall balance assessment: Needs assistance Sitting-balance support: Feet supported Sitting balance-Leahy Scale: Good     Standing balance support: Single extremity supported Standing balance-Leahy Scale: Fair                             ADL either performed or assessed with clinical judgement   ADL Overall ADL's : Needs assistance/impaired     Grooming: Modified independent           Upper Body Dressing : Modified independent Upper Body Dressing Details (indicate cue type and reason): anticipate Lower Body Dressing: Modified independent Lower Body Dressing Details (indicate cue type and reason): anticipate Toilet Transfer: Ambulation;Regular Toilet;Supervision/safety Toilet Transfer Details (indicate cue type and reason): with SPC         Functional mobility during ADLs: Supervision/safety;Contact guard assist (approx 30')       Vision Patient Visual Report: No change from baseline       Perception         Praxis         Pertinent Vitals/Pain Pain Assessment Pain Assessment: No/denies pain     Extremity/Trunk Assessment Upper Extremity Assessment Upper Extremity Assessment: Overall WFL for tasks assessed   Lower Extremity Assessment Lower Extremity Assessment: Defer to PT evaluation (R foot transmet)   Cervical / Trunk Assessment Cervical / Trunk Assessment: Normal   Communication Communication Communication: No apparent difficulties   Cognition Arousal: Alert Behavior During Therapy: [facility]  for tasks assessed/performed Cognition: No apparent impairments                               Following commands: Intact       Cueing  General Comments   Cueing Techniques: Verbal cues  vss   Exercises Other Exercises Other Exercises: edu pt/wife re role of OT, role of rehab, discharge recommendations   Shoulder Instructions      Home Living Family/patient expects to be discharged to:: Private  residence Living Arrangements: Spouse/significant other Available Help at Discharge: Family Type of Home: Mobile home Home Access: Ramped entrance     Home Layout: One level     Bathroom Shower/Tub: Arts Development Officer Toilet: Handicapped height     Home Equipment: Agricultural Consultant (2 wheels);Cane - single point          Prior Functioning/Environment Prior Level of Function : Independent/Modified Independent             Mobility Comments: amb with PRN use of SPC, does not drive ADLs Comments: MOD I-I in ADL/IADL    OT Problem List:     OT Treatment/Interventions:        OT Goals(Current goals can be found in the care plan section)   Acute Rehab OT Goals Patient Stated Goal: go home OT Goal Formulation: With patient Time For Goal Achievement: 02/14/24 Potential to Achieve Goals: Good   OT Frequency:       Co-evaluation              AM-PAC OT 6 Clicks Daily Activity     Outcome Measure Help from another person eating meals?: None Help from another person taking care of personal grooming?: None Help from another person toileting, which includes using toliet, bedpan, or urinal?: None Help from another person bathing (including washing, rinsing, drying)?: A Little Help from another person to put on and taking off regular upper body clothing?: None Help from another person to put on and taking off regular lower body clothing?: A Little 6 Click Score: 22   End of Session Equipment Utilized During Treatment: Other (comment) [facility]) Nurse Communication: Mobility status  Activity Tolerance: Patient tolerated treatment well Patient left: in bed;with call bell/phone within reach  OT Visit Diagnosis: Other abnormalities of gait and mobility (R26.89)                Time: 8667-8655 OT Time Calculation (min): 12 min Charges:  OT General Charges $OT Visit: 1 Visit OT Evaluation $OT Eval Low Complexity: 1 Low Therisa Sheffield, OTD OTR/L  01/31/24, 3:42 PM

## 2024-01-31 NOTE — Assessment & Plan Note (Addendum)
 Diabetic foot infection  Patient with noted worsening ulceration of the left fourth toe in setting of multiple prior diabetic foot infections and osteomyelitis requiring amputation Seen by Dr. Neill  with podiatry on 10/27 with recommendation of fourth toe amputation White count within normal limits Will place on IV Unasyn  and vancomycin  for infectious coverage Blood cultures x1 MRI of the left foot to better assess Podiatry consulted-follow-up recommendations

## 2024-01-31 NOTE — ED Triage Notes (Signed)
 Pt to ED sent by Dr Fernande for necrotic toes to L foot and per Dr Fernande pt needs amputation. Currently wearing shoes. Ambulatory with cane.

## 2024-01-31 NOTE — Consult Note (Signed)
 Full note to follow. Pt well known to podiatry. Recent worsening ulceration to left 4th toe with osteomyelitis on xray in outpt clinic. Will need amputation and will plan for this pm.  Will discuss with pt around noon today.  Pt previously discussed amputation this week and is aware plans.

## 2024-02-01 ENCOUNTER — Inpatient Hospital Stay: Payer: Self-pay | Admitting: Certified Registered"

## 2024-02-01 ENCOUNTER — Encounter
Admission: EM | Disposition: A | Discharge status: Home / Self Care | Payer: Self-pay | Source: Ambulatory Visit | Attending: Emergency Medicine

## 2024-02-01 ENCOUNTER — Other Ambulatory Visit: Payer: Self-pay

## 2024-02-01 ENCOUNTER — Encounter: Payer: Self-pay | Admitting: Family Medicine

## 2024-02-01 HISTORY — PX: AMPUTATION TOE: SHX6595

## 2024-02-01 LAB — COMPREHENSIVE METABOLIC PANEL WITH GFR
ALT: 18 U/L (ref 0–44)
AST: 20 U/L (ref 15–41)
Albumin: 3.1 g/dL — ABNORMAL LOW (ref 3.5–5.0)
Alkaline Phosphatase: 68 U/L (ref 38–126)
Anion gap: 6 (ref 5–15)
BUN: 15 mg/dL (ref 8–23)
CO2: 26 mmol/L (ref 22–32)
Calcium: 8.8 mg/dL — ABNORMAL LOW (ref 8.9–10.3)
Chloride: 104 mmol/L (ref 98–111)
Creatinine, Ser: 0.84 mg/dL (ref 0.61–1.24)
GFR, Estimated: >60 mL/min (ref >60)
Glucose, Bld: 198 mg/dL — ABNORMAL HIGH (ref 70–99)
Potassium: 4 mmol/L (ref 3.5–5.1)
Sodium: 136 mmol/L (ref 135–145)
Total Bilirubin: 0.5 mg/dL (ref 0.0–1.2)
Total Protein: 6.3 g/dL — ABNORMAL LOW (ref 6.5–8.1)

## 2024-02-01 LAB — CBC
HCT: 34.6 % — ABNORMAL LOW (ref 39.0–52.0)
Hemoglobin: 11.8 g/dL — ABNORMAL LOW (ref 13.0–17.0)
MCH: 30.4 pg (ref 26.0–34.0)
MCHC: 34.1 g/dL (ref 30.0–36.0)
MCV: 89.2 fL (ref 80.0–100.0)
Platelets: 194 K/uL (ref 150–400)
RBC: 3.88 MIL/uL — ABNORMAL LOW (ref 4.22–5.81)
RDW: 12.4 % (ref 11.5–15.5)
WBC: 5.8 K/uL (ref 4.0–10.5)
nRBC: 0 % (ref 0.0–0.2)

## 2024-02-01 LAB — C-REACTIVE PROTEIN: CRP: <0.5 mg/dL (ref <1.0)

## 2024-02-01 LAB — GLUCOSE, CAPILLARY
Glucose-Capillary: 153 mg/dL — ABNORMAL HIGH (ref 70–99)
Glucose-Capillary: 120 mg/dL — ABNORMAL HIGH (ref 70–99)
Glucose-Capillary: 132 mg/dL — ABNORMAL HIGH (ref 70–99)

## 2024-02-01 SURGERY — AMPUTATION, TOE
Anesthesia: General | Site: Toe | Laterality: Left

## 2024-02-01 MED ORDER — METOPROLOL SUCCINATE ER 50 MG PO TB24: 25.0000 mg | ORAL_TABLET | Freq: Every day | ORAL | Status: AC

## 2024-02-01 MED ORDER — PHENYLEPHRINE 80 MCG/ML (10ML) SYRINGE FOR IV PUSH (FOR BLOOD PRESSURE SUPPORT)
PREFILLED_SYRINGE | INTRAVENOUS | Status: AC
  Filled 2024-02-01: qty 10

## 2024-02-01 MED ORDER — EPHEDRINE 5 MG/ML INJ
INTRAVENOUS | Status: AC
  Filled 2024-02-01: qty 5

## 2024-02-01 MED ORDER — FENTANYL CITRATE (PF) 100 MCG/2ML IJ SOLN
INTRAMUSCULAR | Status: DC | PRN
  Administered 2024-02-01: 50 ug via INTRAVENOUS

## 2024-02-01 MED ORDER — LIDOCAINE HCL (PF) 1 % IJ SOLN
INTRAMUSCULAR | Status: AC
Start: 2024-02-01 — End: 2024-02-01
  Filled 2024-02-01: qty 30

## 2024-02-01 MED ORDER — INSULIN ASPART 100 UNIT/ML IJ SOLN: 0.0000 [IU] | Freq: Three times a day (TID) | INTRAMUSCULAR | Status: DC

## 2024-02-01 MED ORDER — ADULT MULTIVITAMIN W/MINERALS CH: 1.0000 | ORAL_TABLET | Freq: Every day | ORAL | Status: DC

## 2024-02-01 MED ORDER — 0.9 % SODIUM CHLORIDE (POUR BTL) OPTIME
TOPICAL | Status: DC | PRN
  Administered 2024-02-01: 500 mL

## 2024-02-01 MED ORDER — MORPHINE SULFATE (PF) 2 MG/ML IV SOLN
2.0000 mg | INTRAVENOUS | Status: DC | PRN
  Administered 2024-02-01: 2 mg via INTRAVENOUS
  Filled 2024-02-01: qty 1

## 2024-02-01 MED ORDER — MIDAZOLAM HCL (PF) 2 MG/2ML IJ SOLN
INTRAMUSCULAR | Status: DC | PRN
  Administered 2024-02-01 (×2): 1 mg via INTRAVENOUS

## 2024-02-01 MED ORDER — OXYCODONE-ACETAMINOPHEN 5-325 MG PO TABS
1.0000 | ORAL_TABLET | Freq: Four times a day (QID) | ORAL | 0 refills | Status: DC | PRN
  Filled 2024-02-01: qty 30, 4d supply, fill #0

## 2024-02-01 MED ORDER — FENTANYL CITRATE (PF) 100 MCG/2ML IJ SOLN
INTRAMUSCULAR | Status: AC
  Filled 2024-02-01: qty 2

## 2024-02-01 MED ORDER — METOPROLOL SUCCINATE ER 25 MG PO TB24
25.0000 mg | ORAL_TABLET | Freq: Every day | ORAL | Status: DC
  Filled 2024-02-01: qty 1

## 2024-02-01 MED ORDER — PROPOFOL 10 MG/ML IV BOLUS
INTRAVENOUS | Status: AC
  Filled 2024-02-01: qty 20

## 2024-02-01 MED ORDER — ONDANSETRON HCL 4 MG/2ML IJ SOLN
INTRAMUSCULAR | Status: AC
Start: 2024-02-01 — End: 2024-02-01
  Filled 2024-02-01: qty 4

## 2024-02-01 MED ORDER — PROPOFOL 500 MG/50ML IV EMUL
INTRAVENOUS | Status: DC | PRN
  Administered 2024-02-01: 150 ug/kg/min via INTRAVENOUS

## 2024-02-01 MED ORDER — FENTANYL CITRATE (PF) 100 MCG/2ML IJ SOLN: 25.0000 ug | INTRAMUSCULAR | Status: DC | PRN

## 2024-02-01 MED ORDER — BUPIVACAINE HCL (PF) 0.5 % IJ SOLN
INTRAMUSCULAR | Status: AC
  Filled 2024-02-01: qty 30

## 2024-02-01 MED ORDER — DOXYCYCLINE HYCLATE 100 MG PO TABS: 100.0000 mg | ORAL_TABLET | Freq: Two times a day (BID) | ORAL | 0 refills | Status: DC

## 2024-02-01 MED ORDER — DOXYCYCLINE HYCLATE 100 MG PO TABS
100.0000 mg | ORAL_TABLET | Freq: Two times a day (BID) | ORAL | 0 refills | Status: AC
  Filled 2024-02-01: qty 10, 5d supply, fill #0

## 2024-02-01 MED ORDER — VITAMIN C 500 MG PO TABS: 500.0000 mg | ORAL_TABLET | Freq: Two times a day (BID) | ORAL | Status: DC

## 2024-02-01 MED ORDER — PROPOFOL 10 MG/ML IV BOLUS
INTRAVENOUS | Status: DC | PRN
Start: 2024-02-01 — End: 2024-02-01
  Administered 2024-02-01: 20 mg via INTRAVENOUS
  Administered 2024-02-01: 30 mg via INTRAVENOUS

## 2024-02-01 MED ORDER — PHENYLEPHRINE 80 MCG/ML (10ML) SYRINGE FOR IV PUSH (FOR BLOOD PRESSURE SUPPORT)
PREFILLED_SYRINGE | INTRAVENOUS | Status: DC | PRN
  Administered 2024-02-01 (×2): 160 ug via INTRAVENOUS

## 2024-02-01 MED ORDER — MIDAZOLAM HCL 2 MG/2ML IJ SOLN
INTRAMUSCULAR | Status: AC
  Filled 2024-02-01: qty 2

## 2024-02-01 MED ORDER — JUVEN PO PACK: 1.0000 | PACK | Freq: Two times a day (BID) | ORAL | Status: DC

## 2024-02-01 MED ORDER — ZINC SULFATE 220 (50 ZN) MG PO CAPS: 220.0000 mg | ORAL_CAPSULE | Freq: Every day | ORAL | Status: DC

## 2024-02-01 MED ORDER — BUPIVACAINE HCL 0.5 % IJ SOLN
INTRAMUSCULAR | Status: DC | PRN
  Administered 2024-02-01: 5 mL

## 2024-02-01 MED ORDER — LACTATED RINGERS IV SOLN: INTRAVENOUS | Status: DC | PRN

## 2024-02-01 MED ORDER — LIDOCAINE HCL (PF) 2 % IJ SOLN
INTRAMUSCULAR | Status: AC
Start: 2024-02-01 — End: 2024-02-01
  Filled 2024-02-01: qty 10

## 2024-02-01 SURGICAL SUPPLY — 42 items
BLADE OSC/SAGITTAL MD 5.5X18 (BLADE) ×1 IMPLANT
BLADE SURG MINI STRL (BLADE) ×1 IMPLANT
BNDG COHESIVE 4X5 TAN STRL LF (GAUZE/BANDAGES/DRESSINGS) ×1 IMPLANT
BNDG ELASTIC 4X5.8 VLCR NS LF (GAUZE/BANDAGES/DRESSINGS) ×1 IMPLANT
BNDG ESMARCH 4X12 STRL LF (GAUZE/BANDAGES/DRESSINGS) ×1 IMPLANT
BNDG GAUZE DERMACEA FLUFF 4 (GAUZE/BANDAGES/DRESSINGS) ×1 IMPLANT
BNDG STRETCH GAUZE 3IN X12FT (GAUZE/BANDAGES/DRESSINGS) ×2 IMPLANT
CUFF TOURN SGL QUICK 12 (TOURNIQUET CUFF) IMPLANT
CUFF TOURN SGL QUICK 18X4 (TOURNIQUET CUFF) IMPLANT
DRAPE FLUOR MINI C-ARM 54X84 (DRAPES) ×1 IMPLANT
DRAPE XRAY CASSETTE 23X24 (DRAPES) IMPLANT
DURAPREP 26ML APPLICATOR (WOUND CARE) ×1 IMPLANT
ELECTRODE REM PT RTRN 9FT ADLT (ELECTROSURGICAL) ×1 IMPLANT
GAUZE PACKING IODOFORM 1/2INX (GAUZE/BANDAGES/DRESSINGS) ×1 IMPLANT
GAUZE SPONGE 4X4 12PLY STRL (GAUZE/BANDAGES/DRESSINGS) ×1 IMPLANT
GAUZE STRETCH 2X75IN STRL (MISCELLANEOUS) ×1 IMPLANT
GAUZE XEROFORM 1X8 LF (GAUZE/BANDAGES/DRESSINGS) ×1 IMPLANT
GLOVE BIO SURGEON STRL SZ7.5 (GLOVE) ×1 IMPLANT
GLOVE INDICATOR 8.0 STRL GRN (GLOVE) ×1 IMPLANT
GOWN STRL REUS W/ TWL XL LVL3 (GOWN DISPOSABLE) ×1 IMPLANT
GOWN STRL REUS W/TWL XL LVL4 (GOWN DISPOSABLE) ×1 IMPLANT
KIT TURNOVER KIT A (KITS) ×1 IMPLANT
LABEL OR SOLS (LABEL) ×1 IMPLANT
MANIFOLD NEPTUNE II (INSTRUMENTS) ×1 IMPLANT
NDL HYPO 25X1 1.5 SAFETY (NEEDLE) ×1 IMPLANT
NDL SAFETY ECLIP 18X1.5 (MISCELLANEOUS) ×1 IMPLANT
NEEDLE HYPO 25X1 1.5 SAFETY (NEEDLE) ×1 IMPLANT
NS IRRIG 500ML POUR BTL (IV SOLUTION) ×1 IMPLANT
PACK EXTREMITY ARMC (MISCELLANEOUS) ×1 IMPLANT
PAD ABD DERMACEA PRESS 5X9 (GAUZE/BANDAGES/DRESSINGS) ×2 IMPLANT
PENCIL SMOKE EVACUATOR (MISCELLANEOUS) ×1 IMPLANT
SHIELD FULL FACE ANTIFOG 7M (MISCELLANEOUS) ×1 IMPLANT
SOL .9 NS 3000ML IRR UROMATIC (IV SOLUTION) ×1 IMPLANT
STOCKINETTE IMPERV 14X48 (MISCELLANEOUS) ×1 IMPLANT
STOCKINETTE M/LG 89821 (MISCELLANEOUS) ×1 IMPLANT
STRAP SAFETY 5IN WIDE (MISCELLANEOUS) ×1 IMPLANT
SUT ETHILON 5-0 FS-2 18 BLK (SUTURE) ×1 IMPLANT
SUTURE EHLN 3-0 FS-10 30 BLK (SUTURE) ×1 IMPLANT
SYR 10ML LL (SYRINGE) ×3 IMPLANT
TIP FAN IRRIG PULSAVAC PLUS (DISPOSABLE) ×1 IMPLANT
TRAP FLUID SMOKE EVACUATOR (MISCELLANEOUS) ×1 IMPLANT
WATER STERILE IRR 500ML POUR (IV SOLUTION) ×1 IMPLANT

## 2024-02-01 NOTE — Care Management CC44 (Signed)
 Condition Code 44 Documentation Completed  Patient Details  Name: Jared Castillo MRN: [id number] Date of Birth: 06-23-1950   Condition Code 44 given:  Yes Patient signature on Condition Code 44 notice:  Yes Documentation of 2 MD's agreement:  Yes Code 44 added to claim:  Yes    Jaice Lague, LCSW 02/01/2024, 4:14 PM

## 2024-02-01 NOTE — Hospital Course (Addendum)
 Hospital course / significant events:   Jared Castillo is a 73 y.o. male with medical history significant of poorly controlled insulin -dependent diabetes, hypertension, hyperlipidemia, peripheral neuropathy, GERD, multiple toe amputations presented with left lower extremity wound   HPI: Patient with noted baseline history of recurrent lower extremity digit infections in the setting of poorly controlled type 2 diabetes status post multiple amputations.  Was recently seen by PCP with noted ulceration of the left fourth digit and placed on triple antibiotic therapy.  Had follow-up with podiatry via Dr. Neill on October 27.  Was directed to the ER 10/29 by podiatry with osteomyelitis on xray. Plan was for transmetatarsal amputation.  10/29: admitted to hospitalist service, plan for surgery tomorrow  10/30: Patient underwent left fourth toe partial amputation. No concerns intraoperatively or postoperatively. From podiatry standpoint patient should be stable for discharge. Order for doxycycline  was written to take for 5 days postsurgery. Instructions on dressings are in discharge instructions and pt to follow-up with podiatry in 1 week. WBAT in postoperative shoe.      Consultants:  Podiatry   Procedures/Surgeries: 02/01/24 L 4th toe amputation w/ Dr Ashley      ASSESSMENT & PLAN:   Osteomyelitis L 4th toe Diabetic foot infection  Hx multiple prior diabetic foot infections and osteomyelitis requiring amputation Post L fourth toe amputation From podiatry standpoint patient should be stable for discharge.  doxycycline  for 5 days postsurgery.  Instructions on dressings are in discharge instructions.  Patient should follow-up with Dr Ashley in 1 week for reevaluation.  WBAT in postoperative shoe.    Uncontrolled type 2 diabetes mellitus with hyperglycemia, with long-term current use of insulin  01/2024 A1c at 12.8  Resume home meds on discharge   Essential hypertension BP stable /  low Holding home hydrochlorothiazide  and lisinopril  --> follow outpatient will need BP check prior to resume these (instructions given on restart home if BP >140/90) Lowered home metoprolol     HLD (hyperlipidemia) statin   GERD PPI    No concerns based on BMI: Body mass index is 23.49 kg/m.SABRA Significantly low or high BMI is associated with higher medical risk.  Underweight - under 18  overweight - 25 to 29 obese - 30 or more Class 1 obesity: BMI of 30.0 to 34 Class 2 obesity: BMI of 35.0 to 39 Class 3 obesity: BMI of 40.0 to 49 Super Morbid Obesity: BMI 50-59 Super-super Morbid Obesity: BMI 60+ Healthy nutrition and physical activity advised as adjunct to other disease management and risk reduction treatments    DVT prophylaxis: lovenox  IV fluids: no continuous IV fluids  Nutrition: npo pending surgery, then plan carb modified diet  Central lines / other devices: none  Code Status: FULL CODE ACP documentation reviewed:  none on file in [facility]  TOC needs: TBD expect will need at least HH/DME Medical barriers to dispo: surgery today. Expected medical readiness for discharge tomorrow unless blood cultures are concerns / complicated surgery / other unexpected.

## 2024-02-01 NOTE — Progress Notes (Signed)
 Initial Nutrition Assessment  DOCUMENTATION CODES:   Not applicable  INTERVENTION:   -Once diet is advanced:  -Liberalize diet to carb modified for widest variety of meal selections -Double protein portions with meals -1 packet Juven BID, each packet provides 95 calories, 2.5 grams of protein (collagen), and 9.8 grams of carbohydrate (3 grams sugar); also contains 7 grams of L-arginine and L-glutamine, 300 mg vitamin C , 15 mg vitamin E, 1.2 mcg vitamin B-12, 9.5 mg zinc , 200 mg calcium , and 1.5 g  Calcium  Beta-hydroxy-Beta-methylbutyrate to support wound healing  -MVI with minerals daily -500 mg vitamin C  BID -220 mg zinc  sulfate daily x 14 days -D/c Glucerna Shake po BID, each supplement provides 220 kcal and 10 grams of protein   NUTRITION DIAGNOSIS:   Increased nutrient needs related to post-op healing as evidenced by estimated needs.  GOAL:   Patient will meet greater than or equal to 90% of their needs  MONITOR:   PO intake, Supplement acceptance, Diet advancement  REASON FOR ASSESSMENT:   Consult Assessment of nutrition requirement/status, Wound healing  ASSESSMENT:   73 y.o. male with medical history significant of poorly controlled insulin -dependent diabetes, hypertension, hyperlipidemia, peripheral neuropathy, GERD, multiple toe amputations presented with left lower extremity wound infection.  Patient admitted with left fourth toe osteomyelitis.   10/30- s/p Amputation left fourth toe at level of PIPJ   Reviewed I/O's: +180 ml x 24 hours   Patient currently NPO secondary to scheduled surgery. He was previously on a heart healthy/ carb modified diet; noted meal completions 100%. Noted surgery was initially scheduled for yesterday, however, was postponed until today secondary to patient drinking coffee.   Case discussed with RN, who confirmed plan for surgery today.   Spoke with patient at bedside, who was pleasant and in good spirits today. He reports feeling  better and is eager for surgery today (I'm glad it got pushed up so I can eat lunch).   Patient reports that he has a great appetite PTA. He shares that he does most of the cooking in his household (my wife says I'm the better cook). He typically consumed 3 meals per day (Breakfast: eggs and toast; Lunch: sandwich; Dinner: meat, starch, and vegetable). He typically drinks water and diet soda.   Per CareEverywhere, weight was 202# on 09/05/23 encounter. Patient has experienced a 4.8% weight loss over the past 5 months, which is not significant for time frame. Per patient, he has had a distant historey of weight loss secondary to uncontrolled DM, but weight loss has slowed since they put me on the right medications.   Discussed DM control PTA with patient. He reports his CBGS are usually around 100. He has not had any hypoglycemic episodes. Of not, patient has had difficulty obtaining medications and has been working with PCP for medications assistance.   Medications reviewed.   Lab Results  Component Value Date   HGBA1C 11.3 (H) 01/31/2024   PTA DM medications are 23 units insulin  determir BID, 0-10 units inuslin lispro TID, and 1000 mg metformin  BID.   Labs reviewed: CBGS: 120-250 (inpatient orders for glycemic control are 0-9 units insulin  aspart TID with meals and 20 units insulin  glargine-yfgn BID).    NUTRITION - FOCUSED PHYSICAL EXAM:  Flowsheet Row Most Recent Value  Orbital Region Mild depletion  Upper Arm Region No depletion  Thoracic and Lumbar Region No depletion  Buccal Region Mild depletion  Temple Region Moderate depletion  Clavicle Bone Region No depletion  Clavicle and Acromion  Bone Region No depletion  Scapular Bone Region No depletion  Dorsal Hand No depletion  Patellar Region No depletion  Anterior Thigh Region No depletion  Posterior Calf Region No depletion  Edema (RD Assessment) None  Hair Reviewed  Eyes Reviewed  Mouth Reviewed  Skin Reviewed  Nails  Reviewed    Diet Order:   Diet Order             Diet Carb Modified Fluid consistency: Thin  Diet effective now                   EDUCATION NEEDS:   Education needs have been addressed  Skin:  Skin Assessment: Skin Integrity Issues: Skin Integrity Issues:: Incisions Incisions: left foot  Last BM:  01/30/24  Height:   Ht Readings from Last 1 Encounters:  02/01/24 6' 4 (1.93 m)    Weight:   Wt Readings from Last 1 Encounters:  02/01/24 87.5 kg    Ideal Body Weight:  91.8 kg  BMI:  Body mass index is 23.49 kg/m.  Estimated Nutritional Needs:   Kcal:  2400-2600  Protein:  130-145 grams  Fluid:  2.0-2.2 L    Margery ORN, RD, LDN, CDCES Registered Dietitian III Certified Diabetes Care and Education Specialist If unable to reach this RD, please use RD Inpatient group chat on secure chat between hours of 8am-4 pm daily

## 2024-02-01 NOTE — Op Note (Signed)
 Operative note   Surgeon:Delanee Xin [employer]/support/administrative Officer: None    Preop diagnosis: Left fourth toe osteomyelitis    Postop diagnosis: Same    Procedure: Amputation left fourth toe at level of PIPJ    EBL: Minimal    Anesthesia: IV with local.  Local consist of a total of 5 cc of plain bupivacaine     Hemostasis: None    Specimen: Wound culture of distal ulcerative site and toe for pathology    Complications: None    Operative indications:Jared Castillo is an 73 y.o. that presents today for surgical intervention.  The risks/benefits/alternatives/complications have been discussed and consent has been given.    Procedure:  Patient was brought into the OR and placed on the operating table in thesupine position. After anesthesia was obtained theleft lower extremity was prepped and draped in usual sterile fashion.  Attention was directed to the left fourth toe where 2 semielliptical skin flaps were created dorsal and plantar.  Full-thickness flaps were created at the level of the PIPJ.  Dissection was carried back proximal to the head of the proximal phalanx.  With a power saw all the toe was then removed from the surgical field in toto.  The wound was flushed with copious amounts of irrigation.  Wound culture of the distal ulcerative site was performed and the remainder of the toe was sent for pathological examination.  Closure was then performed with a 3-0 nylon.    Patient tolerated the procedure and anesthesia well.  Was transported from the OR to the PACU with all vital signs stable and vascular status intact. To be discharged per routine protocol.  Will follow up in approximately 1 week in the outpatient clinic.

## 2024-02-01 NOTE — Transfer of Care (Signed)
 Immediate Anesthesia Transfer of Care Note  Patient: Jared Castillo  Procedure(s) Performed: AMPUTATION, TOE (Left: Toe)  Patient Location: PACU  Anesthesia Type:General  Level of Consciousness: drowsy and patient cooperative  Airway & Oxygen Therapy: Patient Spontanous Breathing and Patient connected to face mask oxygen  Post-op Assessment: Report given to RN, Post -op Vital signs reviewed and stable, and Patient moving all extremities X 4  Post vital signs: Reviewed and stable  Last Vitals:  Vitals Value Taken Time  BP 87/66 02/01/24 12:57  Temp    Pulse 67 02/01/24 12:59  Resp 14 02/01/24 12:59  SpO2 94 % 02/01/24 12:59  Vitals shown include unfiled device data.  Last Pain:  Vitals:   02/01/24 1135  TempSrc: Temporal  PainSc:       Patients Stated Pain Goal: 0 (02/01/24 1036)  Complications: No notable events documented.

## 2024-02-01 NOTE — Plan of Care (Signed)
  Problem: Education: Goal: Knowledge of General Education information will improve Description: Including pain rating scale, medication(s)/side effects and non-pharmacologic comfort measures Outcome: Progressing   Problem: Health Behavior/Discharge Planning: Goal: Ability to manage health-related needs will improve Outcome: Progressing   Problem: Clinical Measurements: Goal: Ability to maintain clinical measurements within normal limits will improve Outcome: Progressing   Problem: Nutrition: Goal: Adequate nutrition will be maintained Outcome: Progressing   Problem: Pain Managment: Goal: General experience of comfort will improve and/or be controlled Outcome: Progressing

## 2024-02-01 NOTE — Discharge Instructions (Signed)

## 2024-02-01 NOTE — H&P (Signed)
 HISTORY AND PHYSICAL INTERVAL NOTE:  02/01/2024  12:15 PM  Jared Castillo  has presented today for surgery, with the diagnosis of Osteomyelitis.  The various methods of treatment have been discussed with the patient.  No guarantees were given.  After consideration of risks, benefits and other options for treatment, the patient has consented to surgery.  I have reviewed the patients' chart and labs.     The patient was reexamined.  There have been no changes to this history and physical examination.  Ashley Soulier A

## 2024-02-01 NOTE — Progress Notes (Signed)
 Patient underwent left fourth toe partial amputation.  No concerns intraoperatively or postoperatively.  From podiatry standpoint patient should be stable for discharge.  Order for doxycycline  was written to take for 5 days postsurgery.  Instructions on dressings are in discharge instructions.  Patient should follow-up with me in 1 week for reevaluation.  Patient can weight-bear as tolerated in postoperative shoe.

## 2024-02-01 NOTE — Anesthesia Preprocedure Evaluation (Signed)
 Anesthesia Evaluation  Patient identified by MRN, date of birth, ID band Patient awake    Reviewed: Allergy & Precautions, NPO status , Patient's Chart, lab work & pertinent test results  History of Anesthesia Complications Negative for: history of anesthetic complications  Airway Mallampati: II  TM Distance: >3 FB Neck ROM: Full    Dental  (+) Edentulous Upper, Edentulous Lower, Poor Dentition   Pulmonary shortness of breath and with exertion, neg sleep apnea, COPD, neg recent URI, Patient abstained from smoking.Not current smoker, former smoker   Pulmonary exam normal breath sounds clear to auscultation       Cardiovascular Exercise Tolerance: Good METShypertension, Pt. on medications (-) angina + Peripheral Vascular Disease  (-) CAD and (-) Past MI Normal cardiovascular exam(-) dysrhythmias  Rhythm:Regular Rate:Normal - Systolic murmurs    Neuro/Psych neg Seizures  Neuromuscular disease (diabetic polyneuropathy)  negative psych ROS   GI/Hepatic ,GERD  Controlled and Medicated,,(+)     (-) substance abuse    Endo/Other  diabetes, Poorly Controlled, Type 2    Renal/GU CRFRenal disease (CKD, nephrolithiasis)     Musculoskeletal  (+) Arthritis ,    Abdominal   Peds  Hematology negative hematology ROS (+)   Anesthesia Other Findings Past Medical History: No date: Chronic kidney disease     Comment:  stones No date: Diabetes mellitus without complication (HCC) No date: GERD (gastroesophageal reflux disease) No date: Hypertension 12: Shingles rash     Comment:  dx yesterday 03/21/11  Reproductive/Obstetrics                              Anesthesia Physical Anesthesia Plan  ASA: 3  Anesthesia Plan: General   Post-op Pain Management: Ofirmev  IV (intra-op)*   Induction: Intravenous  PONV Risk Score and Plan: 2 and Treatment may vary due to age or medical condition, Propofol   infusion and TIVA  Airway Management Planned: Natural Airway and Simple Face Mask  Additional Equipment: None  Intra-op Plan:   Post-operative Plan:   Informed Consent: I have reviewed the patients History and Physical, chart, labs and discussed the procedure including the risks, benefits and alternatives for the proposed anesthesia with the patient or authorized representative who has indicated his/her understanding and acceptance.     Dental advisory given  Plan Discussed with: CRNA  Anesthesia Plan Comments: (Discussed risks of anesthesia with patient, including PONV, sore throat, lip/dental/eye damage. Rare risks discussed as well, such as cardiorespiratory and neurological sequelae, and allergic reactions. Discussed the role of CRNA in patient's perioperative care. Patient understands.)        Anesthesia Quick Evaluation

## 2024-02-01 NOTE — Discharge Summary (Signed)
 Physician Discharge Summary   Patient: Jared Castillo Check MRN: [id number]  DOB: 18-Nov-1950   Admit:     Date of Admission: 01/31/2024 Admitted from: home   Discharge: Date of discharge: 02/01/24 Disposition: Home Condition at discharge: good  CODE STATUS: FULL CODE     Discharge Physician: Laneta Blunt, DO [facility]     PCP: Ashley Boby LABOR, MD  Recommendations for Outpatient Follow-up:  Follow up with PCP Ashley Boby LABOR, MD in 2-4 weeks Follow as directed w/ podiatry    Discharge Instructions     Diet - low sodium heart healthy   Complete by: As directed    Discharge wound care:   Complete by: As directed    SEE PODIATRY INSTRUCTIONS   Increase activity slowly   Complete by: As directed          Discharge Diagnoses: Principal Problem:   Osteomyelitis (HCC) Active Problems:   Uncontrolled type 2 diabetes mellitus with hyperglycemia, with long-term current use of insulin  (HCC)   Essential hypertension   Primary hypertension   HLD (hyperlipidemia)       Hospital course / significant events:   [name] is a 73 y.o. male with medical history significant of poorly controlled insulin -dependent diabetes, hypertension, hyperlipidemia, peripheral neuropathy, GERD, multiple toe amputations presented with left lower extremity wound   HPI: Patient with noted baseline history of recurrent lower extremity digit infections in the setting of poorly controlled type 2 diabetes status post multiple amputations.  Was recently seen by PCP with noted ulceration of the left fourth digit and placed on triple antibiotic therapy.  Had follow-up with podiatry via Dr. Neill on October 27.  Was directed to the ER 10/29 by podiatry with osteomyelitis on xray. Plan was for transmetatarsal amputation.  10/29: admitted to hospitalist service, plan for surgery tomorrow  10/30: Patient underwent left fourth toe partial amputation. No concerns intraoperatively or  postoperatively. From podiatry standpoint patient should be stable for discharge. Order for doxycycline  was written to take for 5 days postsurgery. Instructions on dressings are in discharge instructions and pt to follow-up with podiatry in 1 week. WBAT in postoperative shoe.      Consultants:  Podiatry   Procedures/Surgeries: 02/01/24 L 4th toe amputation w/ Dr Ashley      ASSESSMENT & PLAN:   Osteomyelitis L 4th toe Diabetic foot infection  Hx multiple prior diabetic foot infections and osteomyelitis requiring amputation Post L fourth toe amputation From podiatry standpoint patient should be stable for discharge.  doxycycline  for 5 days postsurgery.  Instructions on dressings are in discharge instructions.  Patient should follow-up with Dr Ashley in 1 week for reevaluation.  WBAT in postoperative shoe.    Uncontrolled type 2 diabetes mellitus with hyperglycemia, with long-term current use of insulin  01/2024 A1c at 12.8  Resume home meds on discharge   Essential hypertension BP stable / low Holding home hydrochlorothiazide  and lisinopril  --> follow outpatient will need BP check prior to resume these (instructions given on restart home if BP >140/90) Lowered home metoprolol     HLD (hyperlipidemia) statin   GERD PPI    No concerns based on BMI: Body mass index is 23.49 kg/m.SABRA Significantly low or high BMI is associated with higher medical risk.  Underweight - under 18  overweight - 25 to 29 obese - 30 or more Class 1 obesity: BMI of 30.0 to 34 Class 2 obesity: BMI of 35.0 to 39 Class 3 obesity: BMI of 40.0 to 49 Super  Morbid Obesity: BMI 50-59 Super-super Morbid Obesity: BMI 60+ Healthy nutrition and physical activity advised as adjunct to other disease management and risk reduction treatments          Discharge Instructions  Allergies as of 02/01/2024       Reactions   Trazodone Other (See Comments)   Nightmares        Medication List      PAUSE taking these medications    lisinopril -hydrochlorothiazide  20-25 MG tablet Wait to take this until your doctor or other care provider tells you to start again. Commonly known as: ZESTORETIC  Take 0.5 tablets by mouth daily.       STOP taking these medications    levofloxacin 500 MG tablet Commonly known as: LEVAQUIN   metroNIDAZOLE  500 MG tablet Commonly known as: FLAGYL    mupirocin  ointment 2 % Commonly known as: BACTROBAN    sulfamethoxazole-trimethoprim 800-160 MG tablet Commonly known as: BACTRIM DS       TAKE these medications    albuterol  108 (90 Base) MCG/ACT inhaler Commonly known as: VENTOLIN  HFA Inhale 1-2 puffs into the lungs every 6 (six) hours as needed for wheezing or shortness of breath.   ascorbic acid  500 MG tablet Commonly known as: VITAMIN C  Take 1 tablet (500 mg total) by mouth 2 (two) times daily.   doxycycline  100 MG tablet Commonly known as: VIBRA -TABS Take 1 tablet (100 mg total) by mouth 2 (two) times daily for 5 days.   feeding supplement (GLUCERNA SHAKE) Liqd Take 237 mLs by mouth 2 (two) times daily between meals.   fluticasone  27.5 MCG/SPRAY nasal spray Commonly known as: VERAMYST Place 2 sprays into the nose daily.   gabapentin  300 MG capsule Commonly known as: NEURONTIN  Take 600 mg by mouth 2 (two) times daily.   insulin  detemir 100 UNIT/ML injection Commonly known as: LEVEMIR  Inject 0.23 mLs (23 Units total) into the skin 2 (two) times daily.   insulin  lispro 100 UNIT/ML injection Commonly known as: HUMALOG  Inject 0-10 Units into the skin 3 (three) times daily as needed for high blood sugar. Sliding scale as needed for BG over 200.   metFORMIN  1000 MG tablet Commonly known as: GLUCOPHAGE  Take 1,000 mg by mouth 2 (two) times daily with a meal.   metoprolol  succinate 50 MG 24 hr tablet Commonly known as: TOPROL -XL Take 0.5 tablets (25 mg total) by mouth daily. Take with or immediately following a meal. What  changed: how much to take   multivitamin with minerals Tabs tablet Take 1 tablet by mouth daily.   omeprazole 20 MG capsule Commonly known as: PRILOSEC Take 20 mg by mouth 2 (two) times daily.   oxyCODONE -acetaminophen  5-325 MG tablet Commonly known as: PERCOCET/ROXICET Take 1-2 tablets by mouth every 6 (six) hours as needed for severe pain (pain score 7-10). What changed: when to take this   rosuvastatin  40 MG tablet Commonly known as: CRESTOR  Take 40 mg by mouth daily.   zinc  sulfate (50mg  elemental zinc ) 220 (50 Zn) MG capsule Take 1 capsule (220 mg total) by mouth daily.               Discharge Care Instructions  (From admission, onward)           Start     Ordered   02/01/24 0000  Discharge wound care:       Comments: SEE PODIATRY INSTRUCTIONS   02/01/24 1523             Follow-up Information  Ashley Soulier, DPM. Go on 02/08/2024.   Specialty: Podiatry Why: hostpial follow up - postop visit in 1 week;  Appt @ 9:45 am Contact information: [address] [place] [address] [phone]                 Allergies  Allergen Reactions   Trazodone Other (See Comments)    Nightmares     Subjective: pt feeling well following procedure, pain controlled, ambulating appropriately w/ postop shoe in plce   Discharge Exam: BP 116/74 (BP Location: Left Arm)   Pulse 72   Temp 97.8 F (36.6 C)   Resp 16   Ht 6' 4 (1.93 m)   Wt 87.5 kg   SpO2 96%   BMI 23.49 kg/m  General: Pt is alert, awake, not in acute distress Cardiovascular: RRR, S1/S2 +, no rubs, no gallops Respiratory: CTA bilaterally, no wheezing, no rhonchi Abdominal: Soft, NT, ND, bowel sounds + Extremities: no edema, no cyanosis     The results of significant diagnostics from this hospitalization (including imaging, microbiology, ancillary and laboratory) are listed below for reference.     Microbiology: Recent Results (from the past 240 hours)  Culture,  blood (Routine X 2) w Reflex to ID Panel     Status: None (Preliminary result)   Collection Time: 01/31/24 10:08 AM   Specimen: BLOOD  Result Value Ref Range Status   Specimen Description BLOOD BLOOD LEFT ARM  Final   Special Requests   Final    BOTTLES DRAWN AEROBIC AND ANAEROBIC Blood Culture adequate volume   Culture   Final    NO GROWTH < 24 HOURS Performed at [facility], [address], [place], [place] [address]    Report Status PENDING  Incomplete  Culture, blood (Routine X 2) w Reflex to ID Panel     Status: None (Preliminary result)   Collection Time: 01/31/24 10:16 AM   Specimen: BLOOD  Result Value Ref Range Status   Specimen Description BLOOD BLOOD RIGHT ARM  Final   Special Requests   Final    BOTTLES DRAWN AEROBIC AND ANAEROBIC Blood Culture adequate volume   Culture   Final    NO GROWTH < 24 HOURS Performed at [facility], [address] [place], [place] [address]    Report Status PENDING  Incomplete     Labs: BNP (last 3 results) No results for input(s): BNP in the last 8760 hours. Basic Metabolic Panel: Recent Labs  Lab 01/31/24 1008 02/01/24 0354  NA 137 136  K 4.0 4.0  CL 98 104  CO2 25 26  GLUCOSE 133* 198*  BUN 17 15  CREATININE 0.88 0.84  CALCIUM  9.2 8.8*   Liver Function Tests: Recent Labs  Lab 02/01/24 0354  AST 20  ALT 18  ALKPHOS 68  BILITOT 0.5  PROT 6.3*  ALBUMIN 3.1*   No results for input(s): LIPASE, AMYLASE in the last 168 hours. No results for input(s): AMMONIA in the last 168 hours. CBC: Recent Labs  Lab 01/31/24 0817 02/01/24 0354  WBC 7.3 5.8  NEUTROABS 4.0  --   HGB 13.2 11.8*  HCT 38.6* 34.6*  MCV 88.3 89.2  PLT 227 194   Cardiac Enzymes: No results for input(s): CKTOTAL, CKMB, CKMBINDEX, TROPONINI in the last 168 hours. BNP: Invalid input(s): POCBNP CBG: Recent Labs  Lab 01/31/24 1557 01/31/24 2058 02/01/24 0758 02/01/24 1141 02/01/24 1300  GLUCAP 169* 250*  153* 120* 132*   D-Dimer No results for  input(s): DDIMER in the last 72 hours. Hgb A1c Recent Labs    01/31/24 1008  HGBA1C 11.3*   Lipid Profile No results for input(s): CHOL, HDL, LDLCALC, TRIG, CHOLHDL, LDLDIRECT in the last 72 hours. Thyroid function studies No results for input(s): TSH, T4TOTAL, T3FREE, THYROIDAB in the last 72 hours.  Invalid input(s): FREET3 Anemia work up No results for input(s): VITAMINB12, FOLATE, FERRITIN, TIBC, IRON, RETICCTPCT in the last 72 hours. Urinalysis    Component Value Date/Time   COLORURINE STRAW (A) 01/16/2017 1642   APPEARANCEUR CLEAR (A) 01/16/2017 1642   APPEARANCEUR Clear 11/29/2012 1049   LABSPEC 1.025 01/16/2017 1642   LABSPEC 1.027 11/29/2012 1049   PHURINE 5.0 01/16/2017 1642   GLUCOSEU >=500 (A) 01/16/2017 1642   GLUCOSEU Negative 11/29/2012 1049   HGBUR SMALL (A) 01/16/2017 1642   BILIRUBINUR NEGATIVE 01/16/2017 1642   BILIRUBINUR Negative 11/29/2012 1049   KETONESUR NEGATIVE 01/16/2017 1642   PROTEINUR NEGATIVE 01/16/2017 1642   NITRITE NEGATIVE 01/16/2017 1642   LEUKOCYTESUR SMALL (A) 01/16/2017 1642   LEUKOCYTESUR Trace 11/29/2012 1049   Sepsis Labs Recent Labs  Lab 01/31/24 0817 02/01/24 0354  WBC 7.3 5.8   Microbiology Recent Results (from the past 240 hours)  Culture, blood (Routine X 2) w Reflex to ID Panel     Status: None (Preliminary result)   Collection Time: 01/31/24 10:08 AM   Specimen: BLOOD  Result Value Ref Range Status   Specimen Description BLOOD BLOOD LEFT ARM  Final   Special Requests   Final    BOTTLES DRAWN AEROBIC AND ANAEROBIC Blood Culture adequate volume   Culture   Final    NO GROWTH < 24 HOURS Performed at [facility], [address], [place], [place] [address]    Report Status PENDING  Incomplete  Culture, blood (Routine X 2) w Reflex to ID Panel     Status: None (Preliminary result)   Collection Time: 01/31/24 10:16 AM   Specimen:  BLOOD  Result Value Ref Range Status   Specimen Description BLOOD BLOOD RIGHT ARM  Final   Special Requests   Final    BOTTLES DRAWN AEROBIC AND ANAEROBIC Blood Culture adequate volume   Culture   Final    NO GROWTH < 24 HOURS Performed at [facility], [address] [place], [place] [address]    Report Status PENDING  Incomplete   Imaging MR FOOT LEFT WO CONTRAST Result Date: 02/01/2024 CLINICAL DATA:  Osteonecrosis suspected, foot, xray done Diabetic foot wound.  Concern for osteomyelitis. EXAM: MR FOOT*L* W/O CM TECHNIQUE: Multiplanar, multisequence MR imaging of the left forefoot was performed. No intravenous contrast was administered. COMPARISON:  Radiographs 10/07/2022.  MRI 10/08/2022 and 12/09/2022. FINDINGS: Bones/Joint/Cartilage Patient has undergone interval extension of the 1st ray amputation to the level of the mid 1st metatarsal shaft. No cortical destruction or suspicious marrow signal abnormality identified within the remaining 1st metatarsal. New advanced arthropathic changes at the 2nd and 3rd metatarsophalangeal joints with subchondral collapse and erosive changes involving the 2nd and 3rd metatarsal heads and 3rd proximal phalangeal base. There is associated cortical thickening and mild marrow edema extending into the metatarsal necks. The 2nd metatarsophalangeal joint appears posteriorly dislocated. There is synovial thickening at the 2nd and 3rd metatarsophalangeal joints without significant effusions. New soft tissue swelling in the 4th toe with probable cortical destruction, increased T2 and decreased T1 marrow signal within the middle and distal phalanges, suspicious for osteomyelitis. No significant abnormalities identified within the 5th ray.  There are progressive midfoot degenerative changes with subchondral cyst formation dorsally in the middle cuneiform bone. Ligaments Intact Lisfranc ligament. The collateral ligaments of the 2nd and 3rd metatarsophalangeal  joints are not well visualized. Muscles and Tendons Chronic fatty atrophy of the forefoot musculature. Postsurgical changes related to amputation through the 1st metatarsal. Soft tissues As above, apparent soft tissue swelling in the 4th toe with possible soft tissue ulceration distally. No focal fluid collection identified. Postsurgical changes medially from the previous amputation. No other obvious skin wounds are identified. IMPRESSION: 1. Interval extension of the 1st ray amputation to the level of the mid 1st metatarsal shaft. No evidence of osteomyelitis within the remaining 1st metatarsal. 2. New soft tissue swelling in the 4th toe with possible soft tissue ulceration distally. No focal fluid collection identified. Associated marrow changes within the 4th middle and distal phalanges, suspicious for osteomyelitis. Correlate clinically. 3. New advanced arthropathic changes at the 2nd and 3rd metatarsophalangeal joints with subchondral collapse and erosive changes involving the 2nd and 3rd metatarsal heads and 3rd proximal phalangeal base. Findings are not typical for osteomyelitis, and could be secondary to osteonecrosis or neuropathic changes. Correlate clinically. 4. Progressive midfoot degenerative changes. Electronically Signed   By: Elsie Perone M.D.   On: 02/01/2024 09:08      Time coordinating discharge: over 30 minutes  SIGNED:  Jovonni Borquez DO [facility]

## 2024-02-02 ENCOUNTER — Encounter: Payer: Self-pay | Admitting: Podiatry

## 2024-02-04 LAB — VITAMIN A: Vitamin A (Retinoic Acid): 66.7 ug/dL (ref 22.0–69.5)

## 2024-02-05 LAB — CULTURE, BLOOD (ROUTINE X 2)
Culture: NO GROWTH
Culture: NO GROWTH
Special Requests: ADEQUATE
Special Requests: ADEQUATE

## 2024-02-05 LAB — SURGICAL PATHOLOGY

## 2024-02-06 LAB — AEROBIC/ANAEROBIC CULTURE W GRAM STAIN (SURGICAL/DEEP WOUND): Culture: NO GROWTH

## 2024-02-24 NOTE — Anesthesia Postprocedure Evaluation (Signed)
 Anesthesia Post Note  Patient: Jared Castillo  Procedure(s) Performed: AMPUTATION, TOE (Left: Toe)  Patient location during evaluation: PACU Anesthesia Type: General Level of consciousness: awake and alert Pain management: pain level controlled Vital Signs Assessment: post-procedure vital signs reviewed and stable Respiratory status: spontaneous breathing, nonlabored ventilation, respiratory function stable and patient connected to nasal cannula oxygen Cardiovascular status: blood pressure returned to baseline and stable Postop Assessment: no apparent nausea or vomiting Anesthetic complications: no   No notable events documented.   Last Vitals:  Vitals:   02/01/24 1335 02/01/24 1516  BP:  116/74  Pulse: 62 72  Resp: 14 16  Temp:  36.6 C  SpO2: 92% 96%    Last Pain:  Vitals:   02/01/24 1330  TempSrc:   PainSc: 0-No pain                 Prentice Murphy

## 2024-04-12 ENCOUNTER — Other Ambulatory Visit: Payer: Self-pay

## 2024-04-12 NOTE — Progress Notes (Signed)
 [facility] Patient Clinic Note  Assessment/Plan: Jared Castillo is a 74 y.o.male  Assessment & Plan Type 2 diabetes mellitus with hyperglycemia, with long-term current use of insulin  (CMS-HCC) Recent improvement in glycemic control. Hemoglobin A1c has decreased from 12.8% to 10.3%, indicating progress towards better management. Current regimen includes Lantus  and short-acting insulin  with a sliding scale. Dietary modifications have been implemented, including avoiding fried foods and consuming healthy meals. He is not using a continuous glucose monitor due to cost, but efforts are being made to obtain a Dexcom sensor through insurance coverage. - Continue Lantus  at 38 units twice daily. - Adjusted sliding scale insulin  to 6 units as a base with additional units based on blood glucose readings. - Attempted to obtain Dexcom sensor and receiver through insurance coverage. - Encouraged continued dietary modifications and healthy eating habits.   Orders:   POCT glycosylated hemoglobin (Hb A1C)   blood-glucose sensor (DEXCOM G7 SENSOR) Devi; 1 Device by Miscellaneous route every ten (10) days.   blood-glucose,receiver,cont (DEXCOM G7 RECEIVER) Misc; 1 each by Miscellaneous route once.  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 Significant pain is present, impacting sleep and daily activities. The ulcer is likely due to pressure and friction, leading to nerve irritation and ulceration. Surgical intervention is planned to remove the affected toes to alleviate pain and address the infection. - Proceed with scheduled toe amputation on Monday. - Prescribed Percocet #5 for pain management until surgery, with a plan to bridge pain until the procedure. - Continue gabapentin  as tolerated - Will  consider referral to a pain management specialist if pain persists post-surgery.   Orders:   oxyCODONE -acetaminophen  (PERCOCET) 5-325 mg per tablet; Take 1 tablet by mouth every four (4) hours as needed.  Pre-op evaluation # Surgical Clearance Per the Revised Goldman cardiac risk index (RCRI), the 6 independent predictors of perioperative cardiac complications are: 1) High-risk surgery (includes any intraperitoneal, intrathoracic, suprainguinal, or peripheral arterial vascular procedures) 2) History of ischemic heart disease (h/o MI or positive stress test, current CP thought due to cardiac angina, use of nitrate therapy, or pathological Q waves on EKG, but NOT including revascularization) 3) History of Heart Failure 4) Stroke 5) preoperative treatment with insulin  (e.g. IDDM) 6) Creatinine >2.0 mg/dL  The perioperative rate of cardiac death, nonfatal myocardial infarction, and nonfatal cardiac arrest is approximately:  No risk factors - 0.4 percent (95% CI 0.1-0.8 percent) One risk factor - 1.0 percent (95% CI 0.5-1.4 percent) Two risk factors - 2.4 percent (95% CI 1.3-3.5 percent) Three or more risk factors - 5.4 percent (95% CI 2.8-7.9 percent) [name], TH, Marcantonio, ER, Mangione, CM, et al, Circulation 1999; 100:1043)  Pt is already on standing beta blockade which reduces her risk to: No risk factors - <1% (versus 0.4 to 1.0%) One to two risk factors - 0.8 to 1.6% (versus 2.2 to 6.6%) Three or more risk factors - >3% (versus >9 %) [name], A, Goldman, L. Circulation 2006; 113:1361) . This patient has 1 risk factor, and hence has a 1% risk of perioperative cardiac complication        Subjective Jared Castillo is a 74 y.o. male  coming to clinic today for the following issues:  Chief Complaint  Patient presents with   Diabetes    Pt does check his sugars at home. The  readings vary. He has been doing as instructed. He hasn't any readings over 250.    Hypertension    Pt  does check his bp at home. He states it is looking good. Denies chest pain, heart palpitations, SOB.   Foot Pain    Pt states that he is having a lot of foot pain and is having 2 more toes removed next week. He would like pain medicine if possible.   HPI: History of Present Illness Jared Castillo is a 74 year old male with diabetes who presents with foot pain and upcoming toe amputation.  He is experiencing significant pain in his left foot due to an infection that necessitates the amputation of the fourth and little toes. He has no toes remaining on his right foot and only two toes left on his left foot. The pain is severe, disrupting his sleep and causing him to stay awake until early morning hours. The infection is localized to the affected toes, with pain primarily originating from the fourth and little toe. The pain is exacerbated by an ulcer caused by his little toe rubbing against a nerve.  He has been prescribed pain medication, but the supply is insufficient as he takes two pills a day and runs out quickly. He also takes gabapentin , two in the morning, two at lunch, and three at night, but finds it ineffective in alleviating his pain.  Regarding his diabetes management, his A1c has improved to 10.3 from previous readings of 12.8, 12.7, and 13.7. He attributes this improvement to dietary changes, such as avoiding fried foods and eating grilled meals and healthy choice meals. He is currently on Lantus , taking 38 units twice a day, and uses a sliding scale for short-acting insulin  with meals. He checks his blood sugar regularly, although he is currently pricking his finger due to the high cost of continuous glucose monitoring systems.  He has recently obtained Medicaid, which has significantly reduced his prescription costs, including his insulin  pens. He is hopeful that his new insurance will cover a continuous glucose monitoring system, as he finds frequent finger pricking painful and  inconvenient.  -   I have reviewed the problem list, medications, and allergies and have updated/reconciled them if needed.   Objective  VITALS: BP 130/76 (BP Site: R Arm, BP Position: Sitting, BP Cuff Size: Large)   Pulse 76   Temp 36.6 C (97.8 F) (Oral)   Resp 16   Ht 182.9 cm (6' 0.01)   Wt 97.6 kg (215 lb 3.2 oz)   SpO2 97%   BMI 29.18 kg/m   Physical Exam Constitutional:      Appearance: Normal appearance.  HENT:     Head: Normocephalic and atraumatic.  Cardiovascular:     Rate and Rhythm: Normal rate and regular rhythm.     Pulses: Normal pulses.     Heart sounds: Normal heart sounds.  Pulmonary:     Effort: Pulmonary effort is normal.     Breath sounds: Normal breath sounds.  Musculoskeletal:     Right lower leg: No edema.     Left lower leg: No edema.  Skin:    General: Skin is warm and dry.  Neurological:     General: No focal deficit present.     Mental Status: He is alert and oriented to person, place, and time.  Psychiatric:        Mood and Affect: Mood normal.        Behavior: Behavior normal.  Thought Content: Thought content normal.        Judgment: Judgment normal.       LABS/IMAGING I have reviewed pertinent recent labs and imaging in [facility] [address], [address], [place], [place] [address]  Telephone [phone]  Fax [phone]

## 2024-04-12 NOTE — H&P (View-Only) (Signed)
 [facility] Patient Clinic Note  Assessment/Plan: Jared Castillo is a 73 y.o.male  Assessment & Plan Type 2 diabetes mellitus with hyperglycemia, with long-term current use of insulin  (CMS-HCC) Recent improvement in glycemic control. Hemoglobin A1c has decreased from 12.8% to 10.3%, indicating progress towards better management. Current regimen includes Lantus  and short-acting insulin  with a sliding scale. Dietary modifications have been implemented, including avoiding fried foods and consuming healthy meals. He is not using a continuous glucose monitor due to cost, but efforts are being made to obtain a Dexcom sensor through insurance coverage. - Continue Lantus  at 38 units twice daily. - Adjusted sliding scale insulin  to 6 units as a base with additional units based on blood glucose readings. - Attempted to obtain Dexcom sensor and receiver through insurance coverage. - Encouraged continued dietary modifications and healthy eating habits.   Orders:   POCT glycosylated hemoglobin (Hb A1C)   blood-glucose sensor (DEXCOM G7 SENSOR) Devi; 1 Device by Miscellaneous route every ten (10) days.   blood-glucose,receiver,cont (DEXCOM G7 RECEIVER) Misc; 1 each by Miscellaneous route once.  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 Significant pain is present, impacting sleep and daily activities. The ulcer is likely due to pressure and friction, leading to nerve irritation and ulceration. Surgical intervention is planned to remove the affected toes to alleviate pain and address the infection. - Proceed with scheduled toe amputation on Monday. - Prescribed Percocet #5 for pain management until surgery, with a plan to bridge pain until the procedure. - Continue gabapentin  as tolerated - Will  consider referral to a pain management specialist if pain persists post-surgery.   Orders:   oxyCODONE -acetaminophen  (PERCOCET) 5-325 mg per tablet; Take 1 tablet by mouth every four (4) hours as needed.  Pre-op evaluation # Surgical Clearance Per the Revised Jared Castillo cardiac risk index (RCRI), the 6 independent predictors of perioperative cardiac complications are: 1) High-risk surgery (includes any intraperitoneal, intrathoracic, suprainguinal, or peripheral arterial vascular procedures) 2) History of ischemic heart disease (h/o MI or positive stress test, current CP thought due to cardiac angina, use of nitrate therapy, or pathological Q waves on EKG, but NOT including revascularization) 3) History of Heart Failure 4) Stroke 5) preoperative treatment with insulin  (e.g. IDDM) 6) Creatinine >2.0 mg/dL  The perioperative rate of cardiac death, nonfatal myocardial infarction, and nonfatal cardiac arrest is approximately:  No risk factors - 0.4 percent (95% CI 0.1-0.8 percent) One risk factor - 1.0 percent (95% CI 0.5-1.4 percent) Two risk factors - 2.4 percent (95% CI 1.3-3.5 percent) Three or more risk factors - 5.4 percent (95% CI 2.8-7.9 percent) Jared Castillo, TH, Jared Castillo, ER, Jared Castillo, CM, et al, Circulation 1999; 100:1043)  Pt is already on standing beta blockade which reduces her risk to: No risk factors - <1% (versus 0.4 to 1.0%) One to two risk factors - 0.8 to 1.6% (versus 2.2 to 6.6%) Three or more risk factors - >3% (versus >9 %) Jared Castillo, A, Jared Castillo, L. Circulation 2006; 113:1361) . This patient has 1 risk factor, and hence has a 1% risk of perioperative cardiac complication        Subjective Jared Castillo is a 74 y.o. male  coming to clinic today for the following issues:  Chief Complaint  Patient presents with   Diabetes    Pt does check his sugars at home. The  readings vary. He has been doing as instructed. He hasn't any readings over 250.    Hypertension    Pt  does check his bp at home. He states it is looking good. Denies chest pain, heart palpitations, SOB.   Foot Pain    Pt states that he is having a lot of foot pain and is having 2 more toes removed next week. He would like pain medicine if possible.   HPI: History of Present Illness Jared Castillo is a 74 year old male with diabetes who presents with foot pain and upcoming toe amputation.  He is experiencing significant pain in his left foot due to an infection that necessitates the amputation of the fourth and little toes. He has no toes remaining on his right foot and only two toes left on his left foot. The pain is severe, disrupting his sleep and causing him to stay awake until early morning hours. The infection is localized to the affected toes, with pain primarily originating from the fourth and little toe. The pain is exacerbated by an ulcer caused by his little toe rubbing against a nerve.  He has been prescribed pain medication, but the supply is insufficient as he takes two pills a day and runs out quickly. He also takes gabapentin , two in the morning, two at lunch, and three at night, but finds it ineffective in alleviating his pain.  Regarding his diabetes management, his A1c has improved to 10.3 from previous readings of 12.8, 12.7, and 13.7. He attributes this improvement to dietary changes, such as avoiding fried foods and eating grilled meals and healthy choice meals. He is currently on Lantus , taking 38 units twice a day, and uses a sliding scale for short-acting insulin  with meals. He checks his blood sugar regularly, although he is currently pricking his finger due to the high cost of continuous glucose monitoring systems.  He has recently obtained Medicaid, which has significantly reduced his prescription costs, including his insulin  pens. He is hopeful that his new insurance will cover a continuous glucose monitoring system, as he finds frequent finger pricking painful and  inconvenient.  -   I have reviewed the problem list, medications, and allergies and have updated/reconciled them if needed.   Objective  VITALS: BP 130/76 (BP Site: R Arm, BP Position: Sitting, BP Cuff Size: Large)   Pulse 76   Temp 36.6 C (97.8 F) (Oral)   Resp 16   Ht 182.9 cm (6' 0.01)   Wt 97.6 kg (215 lb 3.2 oz)   SpO2 97%   BMI 29.18 kg/m   Physical Exam Constitutional:      Appearance: Normal appearance.  HENT:     Head: Normocephalic and atraumatic.  Cardiovascular:     Rate and Rhythm: Normal rate and regular rhythm.     Pulses: Normal pulses.     Heart sounds: Normal heart sounds.  Pulmonary:     Effort: Pulmonary effort is normal.     Breath sounds: Normal breath sounds.  Musculoskeletal:     Right lower leg: No edema.     Left lower leg: No edema.  Skin:    General: Skin is warm and dry.  Neurological:     General: No focal deficit present.     Mental Status: He is alert and oriented to person, place, and time.  Psychiatric:        Mood and Affect: Mood normal.        Behavior: Behavior normal.  Thought Content: Thought content normal.        Judgment: Judgment normal.       LABS/IMAGING I have reviewed pertinent recent labs and imaging in [facility] [address], [address], [place], [place] [address]  Telephone [phone]  Fax [phone]

## 2024-04-15 ENCOUNTER — Other Ambulatory Visit: Payer: Self-pay

## 2024-04-15 ENCOUNTER — Ambulatory Visit: Admission: RE | Admit: 2024-04-15 | Discharge: 2024-04-15 | Disposition: A

## 2024-04-15 ENCOUNTER — Ambulatory Visit: Admitting: Anesthesiology

## 2024-04-15 ENCOUNTER — Encounter: Admission: RE | Disposition: A | Payer: Self-pay | Source: Home / Self Care

## 2024-04-15 ENCOUNTER — Ambulatory Visit

## 2024-04-15 DIAGNOSIS — M86172 Other acute osteomyelitis, left ankle and foot: Secondary | ICD-10-CM | POA: Insufficient documentation

## 2024-04-15 DIAGNOSIS — Z9889 Other specified postprocedural states: Secondary | ICD-10-CM

## 2024-04-15 DIAGNOSIS — E1142 Type 2 diabetes mellitus with diabetic polyneuropathy: Secondary | ICD-10-CM | POA: Insufficient documentation

## 2024-04-15 DIAGNOSIS — Z794 Long term (current) use of insulin: Secondary | ICD-10-CM | POA: Diagnosis not present

## 2024-04-15 DIAGNOSIS — Z79899 Other long term (current) drug therapy: Secondary | ICD-10-CM | POA: Insufficient documentation

## 2024-04-15 DIAGNOSIS — I10 Essential (primary) hypertension: Secondary | ICD-10-CM | POA: Diagnosis not present

## 2024-04-15 DIAGNOSIS — E11621 Type 2 diabetes mellitus with foot ulcer: Secondary | ICD-10-CM | POA: Insufficient documentation

## 2024-04-15 DIAGNOSIS — E1165 Type 2 diabetes mellitus with hyperglycemia: Secondary | ICD-10-CM | POA: Insufficient documentation

## 2024-04-15 DIAGNOSIS — L97522 Non-pressure chronic ulcer of other part of left foot with fat layer exposed: Secondary | ICD-10-CM | POA: Insufficient documentation

## 2024-04-15 DIAGNOSIS — E1169 Type 2 diabetes mellitus with other specified complication: Secondary | ICD-10-CM | POA: Diagnosis not present

## 2024-04-15 HISTORY — PX: AMPUTATION TOE: SHX6595

## 2024-04-15 LAB — GLUCOSE, CAPILLARY
Glucose-Capillary: 133 mg/dL — ABNORMAL HIGH (ref 70–99)
Glucose-Capillary: 100 mg/dL — ABNORMAL HIGH (ref 70–99)

## 2024-04-15 MED ORDER — PHENYLEPHRINE 80 MCG/ML (10ML) SYRINGE FOR IV PUSH (FOR BLOOD PRESSURE SUPPORT)
PREFILLED_SYRINGE | INTRAVENOUS | Status: DC | PRN
  Administered 2024-04-15: 160 ug via INTRAVENOUS
  Administered 2024-04-15: 80 ug via INTRAVENOUS
  Administered 2024-04-15: 240 ug via INTRAVENOUS
  Administered 2024-04-15: 80 ug via INTRAVENOUS

## 2024-04-15 MED ORDER — LIDOCAINE HCL (CARDIAC) PF 100 MG/5ML IV SOSY
PREFILLED_SYRINGE | INTRAVENOUS | Status: DC | PRN
  Administered 2024-04-15: 60 mg via INTRAVENOUS

## 2024-04-15 MED ORDER — MIDAZOLAM HCL (PF) 2 MG/2ML IJ SOLN
INTRAMUSCULAR | Status: DC | PRN
  Administered 2024-04-15: 2 mg via INTRAVENOUS

## 2024-04-15 MED ORDER — IBUPROFEN 600 MG PO TABS
600.0000 mg | ORAL_TABLET | Freq: Three times a day (TID) | ORAL | 0 refills | Status: AC | PRN
  Filled 2024-04-15: qty 30, 10d supply, fill #0

## 2024-04-15 MED ORDER — OXYCODONE HCL 5 MG PO TABS
5.0000 mg | ORAL_TABLET | Freq: Once | ORAL | Status: AC | PRN
  Administered 2024-04-15: 5 mg via ORAL

## 2024-04-15 MED ORDER — DEXMEDETOMIDINE HCL IN NACL 200 MCG/50ML IV SOLN
INTRAVENOUS | Status: DC | PRN
  Administered 2024-04-15: 12 ug via INTRAVENOUS

## 2024-04-15 MED ORDER — CEFAZOLIN SODIUM-DEXTROSE 2-4 GM/100ML-% IV SOLN
2.0000 g | INTRAVENOUS | Status: AC
  Administered 2024-04-15: 2 g via INTRAVENOUS

## 2024-04-15 MED ORDER — BUPIVACAINE HCL (PF) 0.5 % IJ SOLN
INTRAMUSCULAR | Status: AC
  Filled 2024-04-15: qty 30

## 2024-04-15 MED ORDER — 0.9 % SODIUM CHLORIDE (POUR BTL) OPTIME
TOPICAL | Status: DC | PRN
  Administered 2024-04-15: 500 mL

## 2024-04-15 MED ORDER — OXYCODONE HCL 5 MG/5ML PO SOLN: 5.0000 mg | Freq: Once | ORAL | Status: AC | PRN

## 2024-04-15 MED ORDER — SODIUM CHLORIDE 0.9 % IV SOLN: INTRAVENOUS | Status: DC | PRN

## 2024-04-15 MED ORDER — CEFAZOLIN SODIUM-DEXTROSE 2-4 GM/100ML-% IV SOLN
INTRAVENOUS | Status: AC
  Filled 2024-04-15: qty 100

## 2024-04-15 MED ORDER — ACETAMINOPHEN 10 MG/ML IV SOLN: 1000.0000 mg | Freq: Once | INTRAVENOUS | Status: DC | PRN

## 2024-04-15 MED ORDER — EPHEDRINE SULFATE-NACL 50-0.9 MG/10ML-% IV SOSY
PREFILLED_SYRINGE | INTRAVENOUS | Status: DC | PRN
  Administered 2024-04-15 (×2): 10 mg via INTRAVENOUS
  Administered 2024-04-15: 5 mg via INTRAVENOUS

## 2024-04-15 MED ORDER — VASOPRESSIN 20 UNIT/ML IV SOLN
INTRAVENOUS | Status: DC | PRN
  Administered 2024-04-15 (×2): 2 [IU] via INTRAVENOUS

## 2024-04-15 MED ORDER — ACETAMINOPHEN 10 MG/ML IV SOLN
INTRAVENOUS | Status: DC | PRN
  Administered 2024-04-15: 1000 mg via INTRAVENOUS

## 2024-04-15 MED ORDER — PROPOFOL 10 MG/ML IV BOLUS
INTRAVENOUS | Status: DC | PRN
  Administered 2024-04-15: 1 mg via INTRAVENOUS

## 2024-04-15 MED ORDER — OXYCODONE-ACETAMINOPHEN 5-325 MG PO TABS
1.0000 | ORAL_TABLET | ORAL | 0 refills | Status: AC | PRN
  Filled 2024-04-15: qty 20, 4d supply, fill #0

## 2024-04-15 MED ORDER — LIDOCAINE HCL 1 % IJ SOLN
INTRAMUSCULAR | Status: DC | PRN
  Administered 2024-04-15: 10 mL via INTRAMUSCULAR

## 2024-04-15 MED ORDER — GLYCOPYRROLATE 0.2 MG/ML IJ SOLN
INTRAMUSCULAR | Status: DC | PRN
  Administered 2024-04-15: .2 mg via INTRAVENOUS

## 2024-04-15 MED ORDER — LIDOCAINE HCL (PF) 1 % IJ SOLN
INTRAMUSCULAR | Status: AC
  Filled 2024-04-15: qty 30

## 2024-04-15 MED ORDER — DROPERIDOL 2.5 MG/ML IJ SOLN: 0.6250 mg | Freq: Once | INTRAMUSCULAR | Status: DC | PRN

## 2024-04-15 MED ORDER — FENTANYL CITRATE (PF) 100 MCG/2ML IJ SOLN: 25.0000 ug | INTRAMUSCULAR | Status: DC | PRN

## 2024-04-15 MED ORDER — OXYCODONE HCL 5 MG PO TABS
ORAL_TABLET | ORAL | Status: AC
  Filled 2024-04-15: qty 1

## 2024-04-15 MED ORDER — ACETAMINOPHEN 10 MG/ML IV SOLN
INTRAVENOUS | Status: AC
  Filled 2024-04-15: qty 100

## 2024-04-15 MED ORDER — GABAPENTIN 100 MG PO CAPS
100.0000 mg | ORAL_CAPSULE | Freq: Every day | ORAL | 0 refills | Status: AC
  Filled 2024-04-15: qty 5, 5d supply, fill #0

## 2024-04-15 MED ORDER — PROPOFOL 10 MG/ML IV BOLUS
INTRAVENOUS | Status: AC
  Filled 2024-04-15: qty 20

## 2024-04-15 MED ORDER — ONDANSETRON HCL 4 MG/2ML IJ SOLN
INTRAMUSCULAR | Status: DC | PRN
  Administered 2024-04-15 (×2): 4 mg via INTRAVENOUS

## 2024-04-15 MED ORDER — MIDAZOLAM HCL 2 MG/2ML IJ SOLN
INTRAMUSCULAR | Status: AC
  Filled 2024-04-15: qty 2

## 2024-04-15 NOTE — Transfer of Care (Signed)
 Immediate Anesthesia Transfer of Care Note  Patient: Jared Castillo  Procedure(s) Performed: AMPUTATION, TOE (Left: Fourth Toe)  Patient Location: PACU  Anesthesia Type:General  Level of Consciousness: awake, drowsy, and patient cooperative  Airway & Oxygen Therapy: Patient Spontanous Breathing and Patient connected to face mask oxygen  Post-op Assessment: Report given to RN and Post -op Vital signs reviewed and stable  Post vital signs: Reviewed and stable  Last Vitals:  Vitals Value Taken Time  BP 105/78 04/15/24 18:23  Temp    Pulse 75 04/15/24 18:25  Resp 20 04/15/24 18:25  SpO2 98 % 04/15/24 18:25  Vitals shown include unfiled device data.  Last Pain:  Vitals:   04/15/24 1513  PainSc: 7          Complications: No notable events documented.

## 2024-04-15 NOTE — Interval H&P Note (Signed)
 HISTORY AND PHYSICAL INTERVAL NOTE:  04/15/2024  5:24 PM  Jared Castillo  has presented today for surgery, with the diagnosis of Acute osteomyelitis of left ankle or foot HCC M86.172 Skin ulcer of left foot, with fat layer exposed HCC L97.522.  The various methods of treatment have been discussed with the patient.  No guarantees were given.  After consideration of risks, benefits and other options for treatment, the patient has consented to surgery.  I have reviewed the patients chart and labs.    A history and physical examination and is consistent with current status today on re-examination. There have been no changes to this history and physical examination. Procedure as elected remains warranted.   Carlester Kasparek KANDICE Blush

## 2024-04-15 NOTE — Anesthesia Preprocedure Evaluation (Addendum)
"                                    Anesthesia Evaluation  Patient identified by MRN, date of birth, ID band Patient awake    Reviewed: Allergy & Precautions, H&P , NPO status , Patient's Chart, lab work & pertinent test results, reviewed documented beta blocker date and time   Airway Mallampati: II  TM Distance: >3 FB Neck ROM: full    Dental no notable dental hx. (+) Teeth Intact   Pulmonary neg pulmonary ROS, former smoker   Pulmonary exam normal breath sounds clear to auscultation       Cardiovascular Exercise Tolerance: Poor hypertension, On Medications negative cardio ROS Normal cardiovascular exam Rhythm:regular Rate:Normal     Neuro/Psych negative neurological ROS  negative psych ROS   GI/Hepatic negative GI ROS, Neg liver ROS,GERD  Medicated,,  Endo/Other  negative endocrine ROSdiabetes, Poorly Controlled    Renal/GU Renal diseasenegative Renal ROS  negative genitourinary   Musculoskeletal   Abdominal   Peds  Hematology negative hematology ROS (+)   Anesthesia Other Findings   Reproductive/Obstetrics negative OB ROS                              Anesthesia Physical Anesthesia Plan  ASA: 3 and emergent  Anesthesia Plan: General LMA   Post-op Pain Management:    Induction:   PONV Risk Score and Plan:   Airway Management Planned:   Additional Equipment:   Intra-op Plan:   Post-operative Plan:   Informed Consent: I have reviewed the patients History and Physical, chart, labs and discussed the procedure including the risks, benefits and alternatives for the proposed anesthesia with the patient or authorized representative who has indicated his/her understanding and acceptance.       Plan Discussed with: CRNA  Anesthesia Plan Comments:         Anesthesia Quick Evaluation  "

## 2024-04-15 NOTE — Discharge Instructions (Signed)
 Podiatry (Foot and Ankle) Hospital Discharge Instructions:  WOUND CARE / DRESSINGS:  Keep the dressings to your left foot clean, dry, and intact until your postoperative appointment in clinic.  If the dressings become wet or saturated or loosened, please contact our office for instructions.  If able to change the dressing then do so by removing the dressing, apply nonadherent gauze to the incision site, 4 x 4 gauze, roll gauze, Ace wrap.  Again only change the dressing if it becomes saturated or loosened, please leave the dressing on until your appointment if possible.  ACTIVITY: Ambulate as minimally as possible to the affected lower extremity in a surgical shoe at all times with heel contact only. Try to limit weightbearing to around the house activities.  The more able to stay off of your foot the more likely the incision is to heal.  ANTIBIOTICS: If you were prescribed antibiotics on discharge, please take as instructed until the whole course is finished.   FOLLOW UP: Please make an appointment for 1 week after discharge.  If there are any questions or issues in reference to your lower extremity care that arise in interim of your next appointment, please call the clinic for assistance.    [facility]: [address] [place], [place] [address] Phone: [phone] Hours:Mon - Friday: 8:00am - 5:00pm

## 2024-04-15 NOTE — Op Note (Signed)
 FOOT AND ANKLE SURGERY  OPERATIVE REPORT  SURGEON: Felisa Zechman G. Tanda, DPM  PRE-OPERATIVE DIAGNOSIS: Acute osteomyelitis fourth digit, left foot  POST-OPERATIVE DIAGNOSIS: Same  PROCEDURE(S): Fourth digit amputation at level of metatarsophalangeal joint, left foot  HEMOSTASIS: Tourniquet  ANESTHESIA: regional  ESTIMATED BLOOD LOSS: Minimal  FINDING(S): 1.  Soft necrotic distal margin of the fourth digit bone, left foot  PATHOLOGY/SPECIMEN(S):   ID Type Source Tests Collected by Time Destination  1 : left 4th digit - distal margin Tissue PATH Digit amputation SURGICAL PATHOLOGY Tanda Greig MATSU, DPM 04/15/2024 1755   2 : left 4th digit - proximal margin Tissue PATH Digit amputation SURGICAL PATHOLOGY Tanda Greig MATSU, DPM 04/15/2024 1756   A : deep tissue swab left foot Wound Foot, Left AEROBIC/ANAEROBIC CULTURE W GRAM STAIN (SURGICAL/DEEP WOUND) Tanda Greig MATSU, DPM 04/15/2024 1746     PROCEDURE INDICATIONS:  Jared Castillo is a 74 y.o. 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 Ashley - partial amputation L 4th) with concurrent osseous changes noted at that the distal margin of the residual amputation stump. Despite conservative management and local wound care, wound persisted. With clinical evidence of poor healing capacity given wound extent both medical and surgical treatment options were presented to the patient, of which the patient elected to undergo surgical intervention of via amputation with plans for primary closure. The procedure, alternatives, risks, and limitations in this individual case have been carefully discussed with the patient. All questions have been thoroughly answered and the patient  understands the surgery indicated. The patient has requested that this surgical intervention be undertaken and a consent form was signed.   PROCEDURAL DETAILS:  The patient was brought to the operating room and was transitioned to the operating room table in the supine position. A standard awake time-out was conducted to confirm the correct patient, procedure, side, and site. All team members were in agreement. Anesthesia team initiated anesthesia and administered 2g Ancef  for prophylactic antibiosis.  The operative extremity was prepped with alcohol, and 1:1 solution of 1% lidocaine  plain to 0.5% Marcaine  plain 10ml plain local anesthetic as noted above was administered for local digit/ ray block. An ankle tourniquet was applied and set to 250 mmHg. The extremity was then scrubbed, prepped and draped in the usual aseptic manner, tourniquet was raised.  A full-thickness, modified racquet type incision was made encircling the left fourth digit patient's stump site with dorsal longitudinal arm extending over the level of the metatarsophalangeal joint. This incision was carried directly down to bone. There was noted to be soft, necrotic distal bone of the residual amputation site. Tissue swab was collected for culture and sensitivities. The digit was distracted and sharply disarticulated from the metatarsal head at the level of the metatarsophalangeal joint. This most distal aspect of bone with suspected infection was passed to the back table to be labeled and sent for pathology evaluation as a distal margin. The associated proximal potion of bone specimen which was noted to be absent of gross infection was identified and collected to be sent for pathology evaluation as a proximal margin. Blunt exploration revealed no tracking, pockets of fluid, loculations or active infection. All nonviable necrotic tissue was sharply excised from the surgical site.  Nonfunctioning tendons associated with the amputated digit were  then tensioned and transected at most proximal point visualized. The associated metatarsal head was inspected and noted to be of healthy, viable quality; absent of gross infection.  The surgical site was then irrigated with copious amounts of normal saline via bulb syringe. The surgical site was then closed using 3-0 Nylon.  The tourniquet was released and immediate hyperemia was noted to the operative foot. A dry sterile dressing was applied consisting of Betadine  soaked Adaptic, 4 x 4 gauze, Kerlix and an ACE bandage.   Overall, the patient tolerated the procedure and anesthesia well. They were transported to the recovery room with vital signs stable and vascular status intact to the operative foot. Once the patient receives Xrays and is deemed appropriate by the recovery room staff, the patient will be discharged to home in stable with the following physical and verbal post-operative instructions (below).   POST-OPERATIVE PLAN:  Diet: May advance as tolerated to baseline diet.  Activity: Heel weight bearing on the operative extremity for transfer. Limit excessive ambulation and dependency to the operative extremity. DVT ppx: None indicated for this procedure.  ABX: ABX tailoring and course pending wound culture and specimen evaluation (collected intraoperatively).

## 2024-04-15 NOTE — Brief Op Note (Signed)
 BRIEF POSTOPERATIVE NOTE   Jared Castillo DOS: 04/15/2024   SURGEON: Imer Foxworth G. Tanda, DPM  ASSISTANT(S): None  PRE-OPERATIVE DIAGNOSIS: Acute osteomyelitis fourth digit, left foot  POST-OPERATIVE DIAGNOSIS: Same  PROCEDURE PERFORMED: Fourth digit amputation at level of metatarsal phalangeal joint, left foot    ANESTHESIA: regional    ESTIMATED BLOOD LOSS: MINIMAL   SPECIMEN(S):   1) left fourth digit distal margin - pathology 2) left fourth digit proximal margin - pathology 3) deep tissue wound swab, left foot -microbiology  COMPLICATIONS: None  CONDITION ON TRANSFER: Stable  DISPOSITION: Home  [name] [name]

## 2024-04-17 LAB — SURGICAL PATHOLOGY

## 2024-04-19 NOTE — Anesthesia Postprocedure Evaluation (Signed)
"   Anesthesia Post Note  Patient: Cyndy Fredericks  Procedure(s) Performed: AMPUTATION, TOE (Left: Fourth Toe)  Patient location during evaluation: PACU Anesthesia Type: General Level of consciousness: awake and alert Pain management: pain level controlled Vital Signs Assessment: post-procedure vital signs reviewed and stable Respiratory status: spontaneous breathing, nonlabored ventilation, respiratory function stable and patient connected to nasal cannula oxygen Cardiovascular status: blood pressure returned to baseline and stable Postop Assessment: no apparent nausea or vomiting Anesthetic complications: no   No notable events documented.   Last Vitals:  Vitals:   04/15/24 1845 04/15/24 1857  BP: 103/80 98/76  Pulse: 82 78  Resp: (!) 21 (!) 21  Temp:  36.9 C  SpO2: 91% 93%    Last Pain:  Vitals:   04/16/24 1003  TempSrc:   PainSc: 6                  Lynwood KANDICE Clause      "

## 2024-04-20 LAB — AEROBIC/ANAEROBIC CULTURE W GRAM STAIN (SURGICAL/DEEP WOUND)
Culture: NORMAL
Gram Stain: NONE SEEN
